# Patient Record
Sex: Male | Born: 1970
Health system: Southern US, Community
[De-identification: ages and names within clinical notes are randomized; demographics above are authoritative.]

## PROBLEM LIST (undated history)

## (undated) DIAGNOSIS — E119 Type 2 diabetes mellitus without complications: Secondary | ICD-10-CM

## (undated) DIAGNOSIS — G35 Multiple sclerosis: Secondary | ICD-10-CM

## (undated) DIAGNOSIS — E785 Hyperlipidemia, unspecified: Secondary | ICD-10-CM

## (undated) DIAGNOSIS — N200 Calculus of kidney: Secondary | ICD-10-CM

## (undated) DIAGNOSIS — I10 Essential (primary) hypertension: Secondary | ICD-10-CM

## (undated) DIAGNOSIS — J45909 Unspecified asthma, uncomplicated: Secondary | ICD-10-CM

## (undated) DIAGNOSIS — T7840XA Allergy, unspecified, initial encounter: Secondary | ICD-10-CM

## (undated) HISTORY — DX: Calculus of kidney: N20.0

## (undated) HISTORY — DX: Hyperlipidemia, unspecified: E78.5

## (undated) HISTORY — DX: Unspecified asthma, uncomplicated: J45.909

## (undated) HISTORY — PX: LITHOTRIPSY: SUR834

## (undated) HISTORY — DX: Multiple sclerosis: G35

## (undated) HISTORY — DX: Allergy, unspecified, initial encounter: T78.40XA

---

## 1997-01-19 DIAGNOSIS — G35 Multiple sclerosis: Secondary | ICD-10-CM

## 1997-01-19 HISTORY — DX: Multiple sclerosis: G35

## 2001-01-19 HISTORY — PX: APPENDECTOMY: SHX54

## 2003-05-17 ENCOUNTER — Encounter: Admission: RE | Admit: 2003-05-17 | Discharge: 2003-05-17 | Payer: Self-pay | Admitting: Family Medicine

## 2010-07-02 ENCOUNTER — Ambulatory Visit (INDEPENDENT_AMBULATORY_CARE_PROVIDER_SITE_OTHER): Payer: BC Managed Care – PPO | Admitting: Family Medicine

## 2010-07-02 ENCOUNTER — Encounter: Payer: Self-pay | Admitting: Family Medicine

## 2010-07-02 DIAGNOSIS — G35 Multiple sclerosis: Secondary | ICD-10-CM

## 2010-07-02 DIAGNOSIS — Z130 Encounter for screening for diseases of the blood and blood-forming organs and certain disorders involving the immune mechanism: Secondary | ICD-10-CM

## 2010-07-02 DIAGNOSIS — Z0000 Encounter for general adult medical examination without abnormal findings: Secondary | ICD-10-CM

## 2010-07-02 DIAGNOSIS — Z23 Encounter for immunization: Secondary | ICD-10-CM

## 2010-07-02 DIAGNOSIS — Z13220 Encounter for screening for lipoid disorders: Secondary | ICD-10-CM

## 2010-07-02 NOTE — Patient Instructions (Signed)
Tdap updated today.  Update fasting labs downstairs one morning. Will call you w/ results.  Update your eye exam.  Return in 1 yr, sooner if needed.

## 2010-07-02 NOTE — Progress Notes (Signed)
  Subjective:    Patient ID: [name], male    DOB: 06-23-1970, 40 y.o.   MRN: [id number]  HPI  40 yo WM presents for NOV to establish care.  He has not seen a doctor in years.  He was diagnosed with MS in 1999.  He sees Dr Estella Husk for neurology care.  It has been 2 yrs since his last visit there.  He stopped Rebif on his own 8 mos ago because has not had an MS flare since he 40.  He has relapsing remitting form with hx of L sided numbness.  Other than a hx of this and kidney stones, he has been healthy.  Due for fasting labs and a tetanus vaccine.  Denies fam hx of prostate or colon cancer.  He is a smoker.  Physically active.  No family hx of premature heart dz.    BP 119/80  Pulse 90  Ht 5\' 11"  (1.803 m)  Wt 186 lb (84.369 kg)  BMI 25.94 kg/m2  SpO2 99%   Review of Systems  Constitutional: Negative for fever, appetite change, fatigue and unexpected weight change.  HENT: Negative for sore throat and neck pain.   Eyes: Negative for pain and visual disturbance.  Respiratory: Negative for shortness of breath.   Cardiovascular: Negative for chest pain, palpitations and leg swelling.  Gastrointestinal: Negative for nausea, diarrhea and constipation.  Genitourinary: Negative for urgency, decreased urine volume, enuresis and difficulty urinating.  Skin: Negative for rash.  Neurological: Negative for numbness and headaches.  Psychiatric/Behavioral: Negative for sleep disturbance and dysphoric mood. The patient is not nervous/anxious.        Objective:   Physical Exam  Constitutional: He appears well-developed and well-nourished. No distress.  HENT:  Head: Normocephalic and atraumatic.  Right Ear: External ear normal.  Left Ear: External ear normal.  Nose: Nose normal.  Mouth/Throat: Oropharynx is clear and moist.  Eyes: Conjunctivae are normal. Pupils are equal, round, and reactive to light. No scleral icterus.  Neck: Normal range of motion. Neck supple. No thyromegaly  present.  Cardiovascular: Normal rate, regular rhythm, normal heart sounds and intact distal pulses.   No murmur heard. Pulmonary/Chest: Effort normal and breath sounds normal. No respiratory distress. He has no wheezes.  Abdominal: Soft. Bowel sounds are normal. He exhibits no distension. There is no tenderness. There is no guarding.       No HSM  Musculoskeletal: He exhibits no edema.  Lymphadenopathy:    He has no cervical adenopathy.  Neurological: He has normal reflexes.  Skin: Skin is warm and dry.  Psychiatric: He has a normal mood and affect.          Assessment & Plan:  CPE done.  Updated Tdap today.  Update fasting labs.  Continue healthy diet and work on regular exercise.  BP perfect.  BMI 25= OK.  He is in the contemplative phase of smoking cessation. Advised him to f/u with Dr Estella Husk re: risks stopping Rebif 8 mos ago. MVI daily.

## 2010-07-03 ENCOUNTER — Encounter: Payer: Self-pay | Admitting: Family Medicine

## 2010-07-03 LAB — LIPID PANEL
Cholesterol: 237 mg/dL — ABNORMAL HIGH (ref 0–200)
LDL Cholesterol: 170 mg/dL — ABNORMAL HIGH (ref 0–99)
Total CHOL/HDL Ratio: 7.2 Ratio
Triglycerides: 172 mg/dL — ABNORMAL HIGH (ref <150)
VLDL: 34 mg/dL (ref 0–40)

## 2010-07-04 ENCOUNTER — Telehealth: Payer: Self-pay | Admitting: Family Medicine

## 2010-07-04 LAB — COMPLETE METABOLIC PANEL WITH GFR
ALT: 15 U/L (ref 0–53)
Albumin: 5 g/dL (ref 3.5–5.2)
Alkaline Phosphatase: 71 U/L (ref 39–117)
GFR, Est Non African American: >60 mL/min (ref >60)
Glucose, Bld: 89 mg/dL (ref 70–99)
Potassium: 4.4 mEq/L (ref 3.5–5.3)
Sodium: 139 mEq/L (ref 135–145)
Total Bilirubin: 0.5 mg/dL (ref 0.3–1.2)
Total Protein: 7.9 g/dL (ref 6.0–8.3)

## 2010-07-04 LAB — CBC WITH DIFFERENTIAL/PLATELET
Eosinophils Relative: 4 % (ref 0–5)
HCT: 46.2 % (ref 39.0–52.0)
Hemoglobin: 15.4 g/dL (ref 13.0–17.0)
Lymphocytes Relative: 33 % (ref 12–46)
MCHC: 33.3 g/dL (ref 30.0–36.0)
MCV: 90.6 fL (ref 78.0–100.0)
Monocytes Absolute: 0.6 10*3/uL (ref 0.1–1.0)
Monocytes Relative: 8 % (ref 3–12)
Neutro Abs: 4.3 10*3/uL (ref 1.7–7.7)
RDW: 13.7 % (ref 11.5–15.5)
WBC: 7.7 10*3/uL (ref 4.0–10.5)

## 2010-07-04 MED ORDER — ATORVASTATIN CALCIUM 20 MG PO TABS: 20.0000 mg | ORAL_TABLET | Freq: Every day | ORAL | Status: DC

## 2010-07-04 NOTE — Telephone Encounter (Signed)
Pls let pt know that his blood counts came back normal.  Fasting sugar, liver and kidney function came back normal.  choesterol is HIGH with an LDL bad chol of 170.  This should be < 100.  Will proceed with treatment for high cholesterol to reduce risk for heart attack and stroke.  RX will be sent to pharmacy to start.  Take at bedtime each night.  Call if any problems.  Repeat labs in 8 wks.

## 2010-07-07 NOTE — Telephone Encounter (Signed)
LMOM for Pt to CB 

## 2010-07-10 NOTE — Telephone Encounter (Signed)
LMOM informing Pt of the above 

## 2010-08-07 ENCOUNTER — Telehealth: Payer: Self-pay | Admitting: Family Medicine

## 2010-08-07 MED ORDER — SIMVASTATIN 40 MG PO TABS: 40.0000 mg | ORAL_TABLET | Freq: Every day | ORAL | Status: DC

## 2010-08-07 NOTE — Telephone Encounter (Signed)
LMOM advising pt of change in med and BW in 56mo,

## 2010-08-07 NOTE — Telephone Encounter (Signed)
Pls let pt know that his Lipitor was denied.  I will change him to RX Simvastatin at bedtime daily.  Recheck LDL and Liver enzymes (lab only ) in 3 mos. Call if any problems.

## 2011-06-25 ENCOUNTER — Encounter: Payer: Self-pay | Admitting: Family Medicine

## 2011-06-25 ENCOUNTER — Ambulatory Visit (INDEPENDENT_AMBULATORY_CARE_PROVIDER_SITE_OTHER): Payer: BC Managed Care – PPO | Admitting: Family Medicine

## 2011-06-25 VITALS — BP 114/74 | HR 80 | Temp 98.5°F | Ht 71.0 in | Wt 189.0 lb

## 2011-06-25 DIAGNOSIS — R221 Localized swelling, mass and lump, neck: Secondary | ICD-10-CM

## 2011-06-25 DIAGNOSIS — R220 Localized swelling, mass and lump, head: Secondary | ICD-10-CM

## 2011-06-25 MED ORDER — AMOXICILLIN-POT CLAVULANATE 875-125 MG PO TABS: 1.0000 | ORAL_TABLET | Freq: Two times a day (BID) | ORAL | Status: AC

## 2011-06-25 NOTE — Progress Notes (Signed)
  Subjective:    Patient ID: [name], male    DOB: 06/23/1970, 41 y.o.   MRN: [id number]  HPI Swollen right LN for 1 month. More tender over the last 2 weeks. Very sensitive the last 4 days. Says started with allergy sxs.  Painful during sleep now.  Says feels like a baseball on the side of his neck. No fever.  Sinus sxs are much improved.  Was taking zyrtec for 2 months.  but is off that now.  No ST or dysphagia.    Review of Systems     Objective:   Physical Exam  Constitutional: [name] is oriented to person, place, and time. [name] appears well-developed and well-nourished.  HENT:  Head: Normocephalic and atraumatic.  Right Ear: External ear normal.  Left Ear: External ear normal.  Nose: Nose normal.  Mouth/Throat: Oropharynx is clear and moist.       TMs and canals are clear. Mild swelling in the right side of neck of the sternoclematoid. No actual palpable LN.  Mild swelling.   Eyes: Conjunctivae and EOM are normal. Pupils are equal, round, and reactive to light.  Neck: Neck supple. No thyromegaly present.  Cardiovascular: Normal rate and normal heart sounds.   Pulmonary/Chest: Effort normal and breath sounds normal.  Lymphadenopathy:    [name] has no cervical adenopathy.  Neurological: [name] is alert and oriented to person, place, and time.  Skin: Skin is warm and dry.  Psychiatric: [name] has a normal mood and affect.   Neck with NROM.        Assessment & Plan:  Neck swelling - Unclear etiology. No palpable lymph node.  Will check CBC w diff and will start ABX for treatment. If not sig better over the weekend then please let me and and consider imaging of th eneck.  [name] is mostly tender of the sternocletomastoid. Consider that this could be a strain of the muscle but infact it is veyr unusual that in a month has not gotten better and in fact has become more prominent and more tender.

## 2011-06-25 NOTE — Patient Instructions (Signed)
Call if not better into next week.   Can take Aleve or IBU to see if helps.

## 2011-06-26 LAB — CBC WITH DIFFERENTIAL/PLATELET
Basophils Absolute: 0 K/uL (ref 0.0–0.1)
Basophils Relative: 1 % (ref 0–1)
Eosinophils Absolute: 0.4 K/uL (ref 0.0–0.7)
Eosinophils Relative: 6 % — ABNORMAL HIGH (ref 0–5)
HCT: 45 % (ref 39.0–52.0)
Hemoglobin: 15.5 g/dL (ref 13.0–17.0)
Lymphocytes Relative: 34 % (ref 12–46)
Lymphs Abs: 2.5 K/uL (ref 0.7–4.0)
MCH: 29.9 pg (ref 26.0–34.0)
MCHC: 34.4 g/dL (ref 30.0–36.0)
MCV: 86.9 fL (ref 78.0–100.0)
Monocytes Absolute: 0.5 K/uL (ref 0.1–1.0)
Monocytes Relative: 7 % (ref 3–12)
Neutro Abs: 4 K/uL (ref 1.7–7.7)
Neutrophils Relative %: 52 % (ref 43–77)
Platelets: 235 K/uL (ref 150–400)
RBC: 5.18 MIL/uL (ref 4.22–5.81)
RDW: 13.5 % (ref 11.5–15.5)
WBC: 7.5 K/uL (ref 4.0–10.5)

## 2012-11-01 ENCOUNTER — Encounter: Payer: Self-pay | Admitting: Family Medicine

## 2012-11-01 ENCOUNTER — Ambulatory Visit (INDEPENDENT_AMBULATORY_CARE_PROVIDER_SITE_OTHER): Payer: BC Managed Care – PPO | Admitting: Family Medicine

## 2012-11-01 VITALS — BP 143/88 | HR 109 | Wt 182.0 lb

## 2012-11-01 DIAGNOSIS — R631 Polydipsia: Secondary | ICD-10-CM

## 2012-11-01 DIAGNOSIS — Z23 Encounter for immunization: Secondary | ICD-10-CM

## 2012-11-01 DIAGNOSIS — R358 Other polyuria: Secondary | ICD-10-CM

## 2012-11-01 DIAGNOSIS — R3589 Other polyuria: Secondary | ICD-10-CM

## 2012-11-01 LAB — POCT UA - MICROALBUMIN: Albumin/Creatinine Ratio, Urine, POC: >300

## 2012-11-01 NOTE — Progress Notes (Signed)
  Subjective:    Patient ID: [name], male    DOB: [birth year], 42 y.o.   MRN: [id number]  HPI Patient recently saw his urologist. He has a history of kidney stones. He complained of increased thirst and urination. He recommended referral to PCP to be evaluated for possible diabetes, or diabetes insipidus. Started about 3 mo ago.  Says sxs were very abrupt.  Still has inc thrist and urination during the day and night.  Says has increased his suger intake. Says sxs started around the time started eating a lot of soda.   No fam hx of DM.  No exposure to TB.  No hx or family hx os scarcoidosis. He quit smoking and started eating tons of winter green lifesavers.  No head trauam. No kidneys problems. No fam hx of lupus.  No hx of electrolyte problems.  Has lost a few lbs. No change in exercise or diet. No personal hx of autoimmune d/o.   Says drinking about 100 oz per day.    Review of Systems     Objective:   Physical Exam  Constitutional: He is oriented to person, place, and time. He appears well-developed and well-nourished.  HENT:  Head: Normocephalic and atraumatic.  Right Ear: External ear normal.  Left Ear: External ear normal.  Nose: Nose normal.  Mouth/Throat: Oropharynx is clear and moist.  Eyes: Conjunctivae and EOM are normal. Pupils are equal, round, and reactive to light.  Neck: Normal range of motion. Neck supple. No thyromegaly present.  Cardiovascular: Normal rate, regular rhythm, normal heart sounds and intact distal pulses.   Pulmonary/Chest: Effort normal and breath sounds normal.  Abdominal: Soft. Bowel sounds are normal. He exhibits no distension and no mass. There is no tenderness. There is no rebound and no guarding.  Musculoskeletal: Normal range of motion. He exhibits no edema.  Lymphadenopathy:    He has no cervical adenopathy.  Neurological: He is alert and oriented to person, place, and time. He has normal reflexes.  Skin: Skin is warm and dry.   Psychiatric: He has a normal mood and affect. His behavior is normal. Judgment and thought content normal.          Assessment & Plan:  Polyuria/polydipsia-unclear if diabetes mellitus or diabetes insipidus. We'll do a CMP, serum and plasma osmolality, urine sodium, and hemoglobin A1c today. Has no family history diabetes is fairly young. But his symptoms did start after he increased his amount of sugar in his diet. He has cut back on that in the last few days since being notified that he did have glucose in his urine. His symptoms were abrupt though which a little bit unusual for diabetes mellitus and more consistent with diabetes insipidus. He has no history of autoimmune disorders or family history of a recent immune disorders. He is not on any medications that should be causing his symptoms.

## 2012-11-02 LAB — COMPLETE METABOLIC PANEL WITH GFR
ALT: 14 U/L (ref 0–53)
Albumin: 4.5 g/dL (ref 3.5–5.2)
CO2: 16 mEq/L — ABNORMAL LOW (ref 19–32)
GFR, Est Non African American: >89 mL/min
Glucose, Bld: 293 mg/dL — ABNORMAL HIGH (ref 70–99)
Potassium: 4.3 mEq/L (ref 3.5–5.3)
Sodium: 131 mEq/L — ABNORMAL LOW (ref 135–145)
Total Protein: 7.3 g/dL (ref 6.0–8.3)

## 2012-11-02 LAB — OSMOLALITY: Osmolality: 296 mOsm/kg (ref 275–300)

## 2012-11-02 LAB — HEMOGLOBIN A1C: Hgb A1c MFr Bld: >20 % — ABNORMAL HIGH (ref <5.7)

## 2012-11-02 LAB — OSMOLALITY, URINE: Osmolality, Ur: 880 mOsm/kg (ref 390–1090)

## 2012-11-03 ENCOUNTER — Other Ambulatory Visit: Payer: Self-pay | Admitting: Family Medicine

## 2012-11-03 DIAGNOSIS — R7309 Other abnormal glucose: Secondary | ICD-10-CM

## 2012-11-03 MED ORDER — AMBULATORY NON FORMULARY MEDICATION: Status: DC

## 2012-11-04 ENCOUNTER — Encounter: Payer: Self-pay | Admitting: *Deleted

## 2012-11-04 ENCOUNTER — Ambulatory Visit (INDEPENDENT_AMBULATORY_CARE_PROVIDER_SITE_OTHER): Payer: BC Managed Care – PPO | Admitting: Family Medicine

## 2012-11-04 ENCOUNTER — Other Ambulatory Visit: Payer: Self-pay | Admitting: *Deleted

## 2012-11-04 DIAGNOSIS — E139 Other specified diabetes mellitus without complications: Secondary | ICD-10-CM

## 2012-11-04 DIAGNOSIS — E119 Type 2 diabetes mellitus without complications: Secondary | ICD-10-CM

## 2012-11-04 MED ORDER — INSULIN GLARGINE 100 UNIT/ML SOLOSTAR PEN: 10.0000 [IU] | PEN_INJECTOR | Freq: Every day | SUBCUTANEOUS | Status: DC

## 2012-11-04 MED ORDER — AMBULATORY NON FORMULARY MEDICATION: Status: DC

## 2012-11-04 MED ORDER — METFORMIN HCL 500 MG PO TABS: 500.0000 mg | ORAL_TABLET | Freq: Two times a day (BID) | ORAL | Status: DC

## 2012-11-04 NOTE — Progress Notes (Signed)
  Subjective:    Patient ID: [name], male    DOB: Oct 26, 1970, 42 y.o.   MRN: [id number] Pt here today for diabetes teaching and education. Pt introduced on do's and don'ts and shown how to check BS and give self insulin injections. Pt verbalized understanding. Scheduled for f/u in 1 week with Dr. Linford Arnold and will be scheduled with endocrinologist. Pt informed to call with any concerns or questions.  Austin Cory, LPN  HPI    Review of Systems     Objective:   Physical Exam        Assessment & Plan:

## 2012-11-11 ENCOUNTER — Ambulatory Visit (INDEPENDENT_AMBULATORY_CARE_PROVIDER_SITE_OTHER): Payer: BC Managed Care – PPO | Admitting: Family Medicine

## 2012-11-11 ENCOUNTER — Encounter: Payer: Self-pay | Admitting: Family Medicine

## 2012-11-11 VITALS — BP 133/83 | HR 99 | Wt 185.0 lb

## 2012-11-11 DIAGNOSIS — E139 Other specified diabetes mellitus without complications: Secondary | ICD-10-CM

## 2012-11-11 DIAGNOSIS — E119 Type 2 diabetes mellitus without complications: Secondary | ICD-10-CM

## 2012-11-11 MED ORDER — METFORMIN HCL 1000 MG PO TABS: 1000.0000 mg | ORAL_TABLET | Freq: Two times a day (BID) | ORAL | Status: DC

## 2012-11-11 NOTE — Progress Notes (Signed)
  Subjective:    Patient ID: [name], male    DOB: 29-Jul-1970, 42 y.o.   MRN: [id number]  HPI DM - has lots of question. Has a hx of MS.  He has dietary questions. He is having a lot of problems with his vision. Up to 15 units on his Lantus. No hypoglycemic events. Tolerating metformin well. Taking BID. Nauseated at first but that is resolved. No diarrhea or loose stools. Initially he denied any family history of diabetes but says he remembered that his mother sister actually had diabetes and died from it. He has been using his metered twice a day. His home fasting blood sugars in the morning have been running around mid 200s. His evening blood sugars have been running in the mid-200s as well. We were able to get him an endocrinology appointment but it's not until early December. Having a lot of vision problems at work. He has to rely number stop the computer screen and he's had significant changes in his vision that makes it very for straining to work. He admits to feeling alittle angry and frustrated about his new diagnosis. He wants to know if a Atkins diet it is okay or not.   Review of Systems     Objective:   Physical Exam  Constitutional: He is oriented to person, place, and time. He appears well-developed and well-nourished.  HENT:  Head: Normocephalic and atraumatic.  Neurological: He is alert and oriented to person, place, and time.  Skin: Skin is warm and dry.  Psychiatric: He has a normal mood and affect. His behavior is normal.          Assessment & Plan:  DM - Increase Lantus 20 units. Goal for fasting under 130.   Will refer for nutrition therapy.  Increase metformin to 1000mg .  I am not a big fan of Atkins diet. After further he just watch his portion sizes on his carbohydrates and eat lean meats. Continue to watch fat intake and salt intake. We'll place a referral for nutrition counseling. Continue check blood sugars at home twice a day. As stated he has a dated  he's not with feeling well I encouraged him to check her sugar. We reviewed warning signs for hyperglycemia as well as hypoglycemia. His wife is hear for the entire conversation today. Will check C-peptide and GAD 65 antibodies. Gave him reassurance about the vision changes. Explained how the cornea swells when the blood sugars are elevated and it is not worth getting an eye exam at this point time until we're able to get his sugars down to reasonable level. For right now he is wearing some over-the-counter readers and this is perfectly fine. Followup in 3-4 weeks to make sure that he is doing well and to answer any additional questions. Keep scheduled appointment in December for consult with endocrinology.  Time spent 30 minutes, greater than 50% of time spent counseling about his new diagnosis of diabetes.

## 2012-12-01 ENCOUNTER — Ambulatory Visit: Payer: BC Managed Care – PPO | Admitting: Family Medicine

## 2012-12-01 DIAGNOSIS — Z0289 Encounter for other administrative examinations: Secondary | ICD-10-CM

## 2012-12-29 ENCOUNTER — Other Ambulatory Visit: Payer: Self-pay | Admitting: *Deleted

## 2012-12-29 MED ORDER — METFORMIN HCL 1000 MG PO TABS: 1000.0000 mg | ORAL_TABLET | Freq: Two times a day (BID) | ORAL | Status: DC

## 2013-01-02 ENCOUNTER — Ambulatory Visit (INDEPENDENT_AMBULATORY_CARE_PROVIDER_SITE_OTHER): Payer: BC Managed Care – PPO | Admitting: Family Medicine

## 2013-01-02 ENCOUNTER — Telehealth: Payer: Self-pay | Admitting: Family Medicine

## 2013-01-02 ENCOUNTER — Encounter: Payer: Self-pay | Admitting: Family Medicine

## 2013-01-02 VITALS — BP 112/68 | HR 86 | Temp 98.5°F | Wt 198.0 lb

## 2013-01-02 DIAGNOSIS — E785 Hyperlipidemia, unspecified: Secondary | ICD-10-CM

## 2013-01-02 DIAGNOSIS — E119 Type 2 diabetes mellitus without complications: Secondary | ICD-10-CM

## 2013-01-02 DIAGNOSIS — E139 Other specified diabetes mellitus without complications: Secondary | ICD-10-CM

## 2013-01-02 LAB — POCT GLYCOSYLATED HEMOGLOBIN (HGB A1C): Hemoglobin A1C: 8.9

## 2013-01-02 LAB — HEMOGLOBIN A1C: Hgb A1c MFr Bld: 8.9 % — AB (ref 4.0–6.0)

## 2013-01-02 NOTE — Telephone Encounter (Signed)
Please call pt: Let hime know I think hsi A1C is down enough to check cholesterol now. i will print labs and he can go anytime.

## 2013-01-02 NOTE — Progress Notes (Signed)
   Subjective:    Patient ID: [name], male    DOB: 12/17/1970, 42 y.o.   MRN: [id number]  HPI DM - no hypoglycemic events. No wounds or sores that are not healing well. Taking medications as prescribed without side effects or problems. We increased his Lantus to 20 units last time I saw him. He was refer him for diabetic education. He does see an eye exam but we wanted to get his blood sugars down before he made referral.  Sugars running 128-183.  Using 40 units on the lantus. He did see nutrition therapy and that was helpful.  Has first endocrine appt later this week.    Lab Results  Component Value Date   HGBA1C >20.0* 11/01/2012   No inc thrist or urination. Has resolved.   Review of Systems     Objective:   Physical Exam  Constitutional: He is oriented to person, place, and time. He appears well-developed and well-nourished.  HENT:  Head: Normocephalic and atraumatic.  Cardiovascular: Normal rate, regular rhythm and normal heart sounds.   Pulmonary/Chest: Effort normal and breath sounds normal.  Neurological: He is alert and oriented to person, place, and time.  Skin: Skin is warm and dry.  Psychiatric: He has a normal mood and affect. His behavior is normal.          Assessment & Plan:  Diabetes-Not well controlled but greatly improved. Did A1C a little early just to make sure on track. I am happy with his recent progress and numbers. Continue current regimen. Followup in 3-4 months. Keep endocrinology appointment for later this week. Hopefully can schedule an eye appointment after the new year. His blood sugars will be down to normal level where he can go and actually get evaluated for retinopathy as well as new lenses.completed nutrition therapy.  Due to check lipids as well.   Lab Results  Component Value Date   HGBA1C 8.9 01/02/2013

## 2013-01-03 NOTE — Telephone Encounter (Signed)
LMOM for pt to return call.  Misty Ahmad, LPN  

## 2013-01-03 NOTE — Telephone Encounter (Signed)
Pt informed.[name]  

## 2013-01-05 ENCOUNTER — Other Ambulatory Visit: Payer: Self-pay | Admitting: Family Medicine

## 2013-01-06 LAB — LDL CHOLESTEROL, DIRECT: Direct LDL: 157 mg/dL — ABNORMAL HIGH

## 2013-01-13 ENCOUNTER — Other Ambulatory Visit: Payer: Self-pay | Admitting: Family Medicine

## 2013-01-13 MED ORDER — SIMVASTATIN 40 MG PO TABS: 40.0000 mg | ORAL_TABLET | Freq: Every evening | ORAL | Status: DC | PRN

## 2013-03-14 LAB — HEMOGLOBIN A1C: Hgb A1c MFr Bld: 6.6 % — AB (ref 4.0–6.0)

## 2013-04-03 ENCOUNTER — Encounter: Payer: Self-pay | Admitting: Family Medicine

## 2013-04-03 ENCOUNTER — Ambulatory Visit (INDEPENDENT_AMBULATORY_CARE_PROVIDER_SITE_OTHER): Payer: BC Managed Care – PPO | Admitting: Family Medicine

## 2013-04-03 VITALS — BP 122/67 | HR 73 | Wt 201.0 lb

## 2013-04-03 DIAGNOSIS — E119 Type 2 diabetes mellitus without complications: Secondary | ICD-10-CM

## 2013-04-03 DIAGNOSIS — G35 Multiple sclerosis: Secondary | ICD-10-CM | POA: Insufficient documentation

## 2013-04-03 DIAGNOSIS — N200 Calculus of kidney: Secondary | ICD-10-CM | POA: Insufficient documentation

## 2013-04-03 DIAGNOSIS — E139 Other specified diabetes mellitus without complications: Secondary | ICD-10-CM

## 2013-04-03 DIAGNOSIS — Z23 Encounter for immunization: Secondary | ICD-10-CM

## 2013-04-03 LAB — POCT GLYCOSYLATED HEMOGLOBIN (HGB A1C): Hemoglobin A1C: 6.6

## 2013-04-03 NOTE — Progress Notes (Signed)
   Subjective:    Patient ID: [name], male    DOB: 1970/06/21, 43 y.o.   MRN: [id number]  HPI Diabetes - no hypoglycemic events. No wounds or sores that are not healing well. No increased thirst or urination. Checking glucose at home. Taking medications as prescribed without any side effects. Did attend the nutrition classes.  Lab Results  Component Value Date   HGBA1C 8.9 01/02/2013    Review of Systems     Objective:   Physical Exam  Constitutional: He is oriented to person, place, and time. He appears well-developed and well-nourished.  HENT:  Head: Normocephalic and atraumatic.  Cardiovascular: Normal rate, regular rhythm and normal heart sounds.   Pulmonary/Chest: Effort normal and breath sounds normal.  Neurological: He is alert and oriented to person, place, and time.  Skin: Skin is warm and dry.  Psychiatric: He has a normal mood and affect. His behavior is normal.          Assessment & Plan:  DM- Much improed. Taking baby aspirin.  Did complete nutriotion eduction.  Did get in for eye exam last week.   On statin.  Continue to follow with endocrinology. At this point I will not addressed his diabetes unless he has any questions or cannot get in with endocrinology. I will be happy to behavior take care of him he and all other issues better.

## 2013-04-03 NOTE — Addendum Note (Signed)
Addended by: Teddy Spike on: 04/03/2013 01:59 PM   Modules accepted: Orders

## 2013-04-05 ENCOUNTER — Encounter: Payer: Self-pay | Admitting: *Deleted

## 2013-04-13 ENCOUNTER — Other Ambulatory Visit: Payer: Self-pay | Admitting: Family Medicine

## 2013-05-18 ENCOUNTER — Other Ambulatory Visit: Payer: Self-pay | Admitting: Family Medicine

## 2013-06-16 ENCOUNTER — Encounter: Payer: Self-pay | Admitting: *Deleted

## 2013-06-16 LAB — POCT GLYCOSYLATED HEMOGLOBIN (HGB A1C): Hemoglobin A1C: 6.8

## 2013-06-22 DIAGNOSIS — N529 Male erectile dysfunction, unspecified: Secondary | ICD-10-CM | POA: Insufficient documentation

## 2013-06-22 DIAGNOSIS — R350 Frequency of micturition: Secondary | ICD-10-CM | POA: Insufficient documentation

## 2013-06-22 DIAGNOSIS — N400 Enlarged prostate without lower urinary tract symptoms: Secondary | ICD-10-CM | POA: Insufficient documentation

## 2013-06-28 DIAGNOSIS — N138 Other obstructive and reflux uropathy: Secondary | ICD-10-CM | POA: Insufficient documentation

## 2013-06-28 DIAGNOSIS — N401 Enlarged prostate with lower urinary tract symptoms: Secondary | ICD-10-CM | POA: Insufficient documentation

## 2013-11-21 ENCOUNTER — Other Ambulatory Visit: Payer: Self-pay | Admitting: Family Medicine

## 2013-12-26 ENCOUNTER — Other Ambulatory Visit: Payer: Self-pay | Admitting: Family Medicine

## 2014-01-22 ENCOUNTER — Other Ambulatory Visit: Payer: Self-pay | Admitting: Family Medicine

## 2014-02-12 ENCOUNTER — Encounter: Payer: Self-pay | Admitting: Family Medicine

## 2014-02-12 ENCOUNTER — Ambulatory Visit (INDEPENDENT_AMBULATORY_CARE_PROVIDER_SITE_OTHER): Payer: BLUE CROSS/BLUE SHIELD | Admitting: Family Medicine

## 2014-02-12 VITALS — BP 119/65 | HR 82 | Ht 71.0 in | Wt 201.0 lb

## 2014-02-12 DIAGNOSIS — Z0000 Encounter for general adult medical examination without abnormal findings: Secondary | ICD-10-CM

## 2014-02-12 DIAGNOSIS — E781 Pure hyperglyceridemia: Secondary | ICD-10-CM

## 2014-02-12 DIAGNOSIS — Z0189 Encounter for other specified special examinations: Secondary | ICD-10-CM

## 2014-02-12 DIAGNOSIS — E139 Other specified diabetes mellitus without complications: Secondary | ICD-10-CM

## 2014-02-12 LAB — POCT GLYCOSYLATED HEMOGLOBIN (HGB A1C): Hemoglobin A1C: 7.2

## 2014-02-12 MED ORDER — METFORMIN HCL 1000 MG PO TABS: ORAL_TABLET | ORAL | Status: DC

## 2014-02-12 NOTE — Progress Notes (Signed)
Subjective:    Patient ID: [name], male    DOB: 12-03-1970, 44 y.o.   MRN: [id number]  HPI Here for CPE today. Working with Endocrinology.  A1 is under 8.0.  On Lantus.  Last eye exam Dr. Marinus Maw.  He is in Furniture conservator/restorer on Animal nutritionist. Doing well overall.  Some exercise but not rcently with change in weather.    Review of Systems Comprehensive ROS is neg per patient. Does get some stool urgency with the metformin.   BP 119/65 mmHg  Pulse 82  Ht 5\' 11"  (1.803 m)  Wt 201 lb (91.173 kg)  BMI 28.05 kg/m2  SpO2 98%    No Known Allergies  Past Medical History  Diagnosis Date  . Multiple sclerosis 1999    Dr Trula Ore  . Nephrolithiasis     sees urology    Past Surgical History  Procedure Laterality Date  . Appendectomy  2003    History   Social History  . Marital Status: Married    Spouse Name: christy    Number of Children: 2  . Years of Education: N/A   Occupational History  . 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 Smoking status: Former Smoker -- 1.00 packs/day    Types: Cigarettes    Quit date: 10/28/2011  . Smokeless tobacco: Not on file  . Alcohol Use: No  . Drug Use: No  . Sexual Activity: Yes   Other Topics Concern  . Not on file   Social History Narrative    Family History  Problem Relation Age of Onset  . Hypertension Mother   . Alcohol abuse Father   . Cancer Maternal Grandfather   . Diabetes Maternal Aunt     Outpatient Encounter Prescriptions as of 02/12/2014  Medication Sig  . AMBULATORY NON FORMULARY MEDICATION Medication Name: Glucometer, strips and lancet to test s x a day. Dx 250.02.  Marland Kitchen AMBULATORY NON FORMULARY MEDICATION Medication Name: Insulin Pen Needle for Lantus Solostar Pen   Dx: Diabetes 250.02  . aspirin 81 MG chewable tablet Chew 81 mg by mouth.  Marland Kitchen LANTUS SOLOSTAR 100 UNIT/ML Solostar Pen INJECT 10 TO 20 UNITS SUBCUTANEOUSLY INTO THE SKIN AT BEDTIME (Patient taking differently: INJECT 10 TO  40 UNITS SUBCUTANEOUSLY INTO THE SKIN AT BEDTIME)  . lisinopril (PRINIVIL,ZESTRIL) 5 MG tablet   . metFORMIN (GLUCOPHAGE) 1000 MG tablet Take 1 tablet by mouth twice daily with a meal  . ONE TOUCH ULTRA TEST test strip USE AS DIRECTED TO TEST BLOOD SUGAR TWICE A DAY  . simvastatin (ZOCOR) 40 MG tablet Take 1 tablet (40 mg total) by mouth at bedtime and may repeat dose one time if needed.  . tamsulosin (FLOMAX) 0.4 MG CAPS capsule Take 0.4 mg by mouth daily.  . [DISCONTINUED] metFORMIN (GLUCOPHAGE) 1000 MG tablet Take 1 tablet by mouth twice daily with a meal **OFFICE APPOINTMENT NEEDED FOR REFILLS**          Objective:   Physical Exam  Constitutional: He is oriented to person, place, and time. He appears well-developed and well-nourished.  HENT:  Head: Normocephalic and atraumatic.  Right Ear: External ear normal.  Left Ear: External ear normal.  Nose: Nose normal.  Mouth/Throat: Oropharynx is clear and moist.  Eyes: Conjunctivae and EOM are normal. Pupils are equal, round, and reactive to light.  Neck: Normal range of motion. Neck supple. No thyromegaly present.  Cardiovascular: Normal rate, regular rhythm, normal heart sounds and  intact distal pulses.   Pulmonary/Chest: Effort normal and breath sounds normal.  Abdominal: Soft. Bowel sounds are normal. He exhibits no distension and no mass. There is no tenderness. There is no rebound and no guarding.  Musculoskeletal: Normal range of motion.  Lymphadenopathy:    He has no cervical adenopathy.  Neurological: He is alert and oriented to person, place, and time. He has normal reflexes.  Skin: Skin is warm and dry.  Psychiatric: He has a normal mood and affect. His behavior is normal. Judgment and thought content normal.          Assessment & Plan:  CPE- doing well overall. Did encourage more regular exercise. He's not on a regular routine lately.  Due for CMP and lipid.  Also check direct LDL. He has a history of  hypertriglyceridemia.  Latent autoimmune diabetes in adult-follows with endocrinology regularly. A1c is down to 7.2 today which is fantastic. Foot exam performed today. Eye exam is up-to-date. We'll call to check it copy of last eye exam.

## 2014-02-12 NOTE — Patient Instructions (Signed)
Keep up a regular exercise program and make sure you are eating a healthy diet Try to eat 4 servings of dairy a day, or if you are lactose intolerant take a calcium with vitamin D daily.  Your vaccines are up to date.   

## 2014-02-14 ENCOUNTER — Other Ambulatory Visit: Payer: Self-pay | Admitting: Family Medicine

## 2014-02-16 LAB — COMPLETE METABOLIC PANEL WITH GFR
ALK PHOS: 65 U/L (ref 39–117)
ALT: 22 U/L (ref 0–53)
AST: 14 U/L (ref 0–37)
Albumin: 4.6 g/dL (ref 3.5–5.2)
BILIRUBIN TOTAL: 0.3 mg/dL (ref 0.2–1.2)
BUN: 20 mg/dL (ref 6–23)
CO2: 23 mEq/L (ref 19–32)
Calcium: 9.8 mg/dL (ref 8.4–10.5)
Chloride: 104 mEq/L (ref 96–112)
Creat: 0.83 mg/dL (ref 0.50–1.35)
GFR, Est Non African American: >89 mL/min
GLUCOSE: 162 mg/dL — AB (ref 70–99)
POTASSIUM: 4.6 meq/L (ref 3.5–5.3)
SODIUM: 139 meq/L (ref 135–145)
TOTAL PROTEIN: 7.3 g/dL (ref 6.0–8.3)

## 2014-02-16 LAB — LDL CHOLESTEROL, DIRECT: Direct LDL: 111 mg/dL — ABNORMAL HIGH

## 2014-02-16 LAB — HIV ANTIBODY (ROUTINE TESTING W REFLEX): HIV 1&2 Ab, 4th Generation: NONREACTIVE

## 2014-02-16 LAB — LIPID PANEL
CHOL/HDL RATIO: 6.3 ratio
Cholesterol: 169 mg/dL (ref 0–200)
HDL: 27 mg/dL — AB (ref >39)
LDL Cholesterol: 91 mg/dL (ref 0–99)
TRIGLYCERIDES: 254 mg/dL — AB (ref <150)
VLDL: 51 mg/dL — ABNORMAL HIGH (ref 0–40)

## 2014-03-19 ENCOUNTER — Other Ambulatory Visit: Payer: Self-pay | Admitting: Family Medicine

## 2014-10-22 ENCOUNTER — Other Ambulatory Visit: Payer: Self-pay | Admitting: Family Medicine

## 2014-12-06 ENCOUNTER — Other Ambulatory Visit: Payer: Self-pay | Admitting: Family Medicine

## 2014-12-06 MED ORDER — BAYER CONTOUR NEXT MONITOR W/DEVICE KIT: PACK | Status: DC

## 2014-12-06 MED ORDER — GLUCOSE BLOOD VI STRP: ORAL_STRIP | Status: DC

## 2014-12-06 NOTE — Telephone Encounter (Signed)
Due to insurance formulary change, preferred Glucometer is now Contour Next meters.

## 2015-02-05 MED FILL — CIALIS 5 MG TABLET: 5 | 15 days supply | Qty: 15 | Fill #7

## 2015-02-05 MED FILL — SIMVASTATIN 40 MG TABLET: 40 | 30 days supply | Qty: 30 | Fill #9

## 2015-02-05 MED FILL — TAMSULOSIN HCL 0.4 MG CAP: 0.4 | 30 days supply | Qty: 30 | Fill #4

## 2015-02-14 ENCOUNTER — Encounter: Payer: BLUE CROSS/BLUE SHIELD | Admitting: Family Medicine

## 2015-02-20 MED FILL — LISINOPRIL 5 MG TABLET: 5 | 90 days supply | Qty: 90 | Fill #2

## 2015-02-20 MED FILL — ONE TOUCH ULTRA TEST STRIPS: 25 days supply | Qty: 50 | Fill #2

## 2015-02-20 MED FILL — INVOKAMET 50-1,000 MG TAB: 50-1000 | 30 days supply | Qty: 60 | Fill #0

## 2015-03-08 MED FILL — SIMVASTATIN 40 MG TABLET: 40 | 30 days supply | Qty: 30 | Fill #10

## 2015-03-08 MED FILL — TAMSULOSIN HCL 0.4 MG CAP: 0.4 | 30 days supply | Qty: 30 | Fill #0

## 2015-03-20 MED FILL — INVOKAMET 50-1,000 MG TAB: 50-1000 | 30 days supply | Qty: 60 | Fill #1

## 2015-04-08 ENCOUNTER — Other Ambulatory Visit: Payer: Self-pay | Admitting: Family Medicine

## 2015-04-08 MED FILL — TAMSULOSIN HCL 0.4 MG CAP: 0.4 | 30 days supply | Qty: 30 | Fill #0

## 2015-04-08 MED FILL — SIMVASTATIN 40 MG TABLET: 40 | 30 days supply | Qty: 30 | Fill #0

## 2015-04-18 ENCOUNTER — Encounter: Payer: Self-pay | Admitting: Family Medicine

## 2015-04-18 ENCOUNTER — Ambulatory Visit (INDEPENDENT_AMBULATORY_CARE_PROVIDER_SITE_OTHER): Payer: BLUE CROSS/BLUE SHIELD | Admitting: Family Medicine

## 2015-04-18 VITALS — BP 133/92 | HR 88 | Ht 71.0 in | Wt 178.0 lb

## 2015-04-18 DIAGNOSIS — R21 Rash and other nonspecific skin eruption: Secondary | ICD-10-CM | POA: Diagnosis not present

## 2015-04-18 DIAGNOSIS — B351 Tinea unguium: Secondary | ICD-10-CM | POA: Diagnosis not present

## 2015-04-18 DIAGNOSIS — Z0000 Encounter for general adult medical examination without abnormal findings: Secondary | ICD-10-CM | POA: Diagnosis not present

## 2015-04-18 DIAGNOSIS — Z23 Encounter for immunization: Secondary | ICD-10-CM

## 2015-04-18 NOTE — Progress Notes (Signed)
   Subjective:    Patient ID: [name], male    DOB: 1971-01-07, 45 y.o.   MRN: [id number]  HPI Here for CPE  Follows with Dr. Steffanie Dunn for his Diabetes. A1C of 6.4. Eye exam adn foot exam is UTD.   She does have an abnormal toenail on the left foot second toenail that he would like me to look at today. He says he thinks that the fungus. He did try an over-the-counter fungal cream for about 3 or 4 weeks but didn't notice any improvement. He is 1 to make sure that it wasn't a problem more dangerous or concerning reflective of any other underlying disorders. Next  He's also had a rash on the end of his penis that's been there for at least 5 months since October. He denies any itching or irritation. He said the skin just stays dry and will peel. Sometimes it feels a little irritated in the shower when water gets on it. He denies any papules or pustules or vesicles. No open wounds or drainage. He has no prior history of eczema or other skin conditions. He's been applying a moisturizer over-the-counter, Lubriderm and it really hasn't seemed to help.  Review of Systems     Objective:   Physical Exam  Constitutional: He is oriented to person, place, and time. He appears well-developed and well-nourished.  HENT:  Head: Normocephalic and atraumatic.  Right Ear: External ear normal.  Left Ear: External ear normal.  Nose: Nose normal.  Mouth/Throat: Oropharynx is clear and moist.  Eyes: Conjunctivae and EOM are normal. Pupils are equal, round, and reactive to light.  Neck: Normal range of motion. Neck supple. No thyromegaly present.  Cardiovascular: Normal rate, regular rhythm, normal heart sounds and intact distal pulses.   Pulmonary/Chest: Effort normal and breath sounds normal.  Abdominal: Soft. Bowel sounds are normal. He exhibits no distension and no mass. There is no tenderness. There is no rebound and no guarding.  Genitourinary:     Musculoskeletal: Normal range of motion.    Lymphadenopathy:    He has no cervical adenopathy.  Neurological: He is alert and oriented to person, place, and time. He has normal reflexes.  Skin: Skin is warm and dry.  Psychiatric: He has a normal mood and affect. His behavior is normal. Judgment and thought content normal.   Second toenail on left foot is thick and yellow throughout the entire nail.       Assessment & Plan:  CPE Keep up a regular exercise program and make sure you are eating a healthy diet Try to eat 4 servings of dairy a day, or if you are lactose intolerant take a calcium with vitamin D daily.  Your vaccines are up to date.    Diabetes - well controlled.  Follows with Dr. Steffanie Dunn.  Dystrophic nail versus onychomycosis-we discussed typical treatment such as oral Lamisil versus topicals including Jublia and Penlac. At this point in time he wants to hold off on any prescription treatment but only call back and let me know if he changes his mind.  Rash, penis - eczema vs tinea. Skin scraping performed today. Will call with results once available.

## 2015-04-22 LAB — FUNGAL STAIN

## 2015-04-22 MED FILL — INVOKAMET 50-1,000 MG TAB: 50-1000 | 30 days supply | Qty: 60 | Fill #2

## 2015-04-23 MED ORDER — TRIAMCINOLONE ACETONIDE 0.1 % EX CREA: 1.0000 "application " | TOPICAL_CREAM | Freq: Every day | CUTANEOUS | Status: DC

## 2015-04-23 NOTE — Addendum Note (Signed)
Addended by: Beatrice Lecher D on: 04/23/2015 10:03 PM   Modules accepted: Orders

## 2015-04-24 MED FILL — TRIAMCINOLONE 0.1% CREAM: 0.1 | 15 days supply | Qty: 15 | Fill #0

## 2015-05-02 ENCOUNTER — Other Ambulatory Visit: Payer: Self-pay | Admitting: *Deleted

## 2015-05-02 MED ORDER — BLOOD GLUCOSE MONITOR KIT: PACK | Status: DC

## 2015-05-06 ENCOUNTER — Other Ambulatory Visit: Payer: Self-pay | Admitting: Family Medicine

## 2015-05-06 MED FILL — LISINOPRIL 5 MG TABLET: 5 | 90 days supply | Qty: 90 | Fill #3

## 2015-05-06 MED FILL — SIMVASTATIN 40 MG TABLET: 40 | 30 days supply | Qty: 30 | Fill #0

## 2015-05-06 MED FILL — MICROLET LANCETS: 33 days supply | Qty: 100 | Fill #0

## 2015-05-06 MED FILL — CONTOUR NEXT METER: W/DEVICE | 1 days supply | Qty: 1 | Fill #0

## 2015-05-06 MED FILL — CONTOUR NEXT STRIPS: 33 days supply | Qty: 100 | Fill #0

## 2015-05-17 MED FILL — METFORMIN HCL ER 500 MG TAB: 500 | 30 days supply | Qty: 120 | Fill #0

## 2015-06-11 ENCOUNTER — Other Ambulatory Visit: Payer: Self-pay | Admitting: Family Medicine

## 2015-06-25 MED FILL — SIMVASTATIN 40 MG TABLET: 40 | 30 days supply | Qty: 30 | Fill #0

## 2015-06-25 MED FILL — INVOKAMET 50-1,000 MG TAB: 50-1000 | 30 days supply | Qty: 60 | Fill #3

## 2015-06-25 MED FILL — CONTOUR NEXT STRIPS: 50 days supply | Qty: 100 | Fill #0

## 2015-07-22 ENCOUNTER — Other Ambulatory Visit: Payer: Self-pay | Admitting: Family Medicine

## 2015-07-22 MED FILL — INVOKAMET 50-1,000 MG TAB: 50-1000 | 30 days supply | Qty: 60 | Fill #4

## 2015-07-22 MED FILL — SIMVASTATIN 40 MG TABLET: 40 | 30 days supply | Qty: 30 | Fill #0

## 2015-08-07 ENCOUNTER — Encounter: Payer: Self-pay | Admitting: *Deleted

## 2015-08-07 LAB — HEMOGLOBIN A1C: A1c: 6.4

## 2015-08-07 LAB — MICROALBUMIN / CREATININE URINE RATIO: MICROALB/CREAT RATIO: 76.4

## 2015-08-19 ENCOUNTER — Other Ambulatory Visit: Payer: Self-pay | Admitting: Family Medicine

## 2015-08-19 MED FILL — INVOKAMET 50-1,000 MG TAB: 50-1000 | 30 days supply | Qty: 60 | Fill #5

## 2015-08-19 MED FILL — LISINOPRIL 5 MG TABLET: 5 | 90 days supply | Qty: 90 | Fill #0

## 2015-08-20 ENCOUNTER — Other Ambulatory Visit: Payer: Self-pay | Admitting: *Deleted

## 2015-08-20 MED ORDER — SIMVASTATIN 40 MG PO TABS: ORAL_TABLET | ORAL | 0 refills | Status: DC

## 2015-08-20 MED FILL — SIMVASTATIN 40 MG TABLET: 40 | 30 days supply | Qty: 30 | Fill #0

## 2015-09-20 ENCOUNTER — Other Ambulatory Visit: Payer: Self-pay | Admitting: *Deleted

## 2015-09-20 ENCOUNTER — Other Ambulatory Visit: Payer: Self-pay | Admitting: Physician Assistant

## 2015-09-20 MED ORDER — GLUCOSE BLOOD VI STRP: ORAL_STRIP | 12 refills | Status: DC

## 2015-09-20 MED FILL — INVOKAMET 50-1,000 MG TAB: 50-1000 | 30 days supply | Qty: 60 | Fill #0

## 2015-09-20 MED FILL — SIMVASTATIN 40 MG TABLET: 40 | 30 days supply | Qty: 30 | Fill #0

## 2015-09-20 MED FILL — CONTOUR NEXT STRIPS: 50 days supply | Qty: 100 | Fill #0

## 2015-10-09 DIAGNOSIS — Z23 Encounter for immunization: Secondary | ICD-10-CM | POA: Diagnosis not present

## 2015-10-09 DIAGNOSIS — R809 Proteinuria, unspecified: Secondary | ICD-10-CM | POA: Diagnosis not present

## 2015-10-09 DIAGNOSIS — E1129 Type 2 diabetes mellitus with other diabetic kidney complication: Secondary | ICD-10-CM | POA: Diagnosis not present

## 2015-10-09 DIAGNOSIS — Z8669 Personal history of other diseases of the nervous system and sense organs: Secondary | ICD-10-CM | POA: Diagnosis not present

## 2015-10-09 LAB — MICROALBUMIN, URINE: Microalbumin Ur, POC: 117 mg/L

## 2015-10-22 ENCOUNTER — Other Ambulatory Visit: Payer: Self-pay | Admitting: Physician Assistant

## 2015-10-23 MED FILL — SIMVASTATIN 40 MG TABLET: 40 | 15 days supply | Qty: 15 | Fill #0

## 2015-10-23 MED FILL — INVOKAMET 50-1,000 MG TAB: 50-1000 | 30 days supply | Qty: 60 | Fill #1

## 2015-10-23 MED FILL — CIALIS 5 MG TABLET: 5 | 15 days supply | Qty: 15 | Fill #0

## 2015-10-28 ENCOUNTER — Telehealth: Payer: Self-pay | Admitting: Family Medicine

## 2015-10-28 NOTE — Telephone Encounter (Signed)
I called pt and left a voicemail to let him know he is due for a f/u appt with Dr. Madilyn Fireman for his Cholesterol and asked him to call the office to schedule the appt. Thanks

## 2015-11-14 MED FILL — SIMVASTATIN 40 MG TABLET: 40 | 90 days supply | Qty: 90 | Fill #0

## 2015-11-20 MED FILL — INVOKAMET 50-1,000 MG TAB: 50-1000 | 30 days supply | Qty: 60 | Fill #2

## 2015-11-20 MED FILL — LISINOPRIL 5 MG TABLET: 5 | 90 days supply | Qty: 90 | Fill #1

## 2015-12-24 MED FILL — INVOKAMET 50-1,000 MG TAB: 50-1000 | 30 days supply | Qty: 60 | Fill #3

## 2015-12-24 MED FILL — CIALIS 5 MG TABLET: 5 | 15 days supply | Qty: 15 | Fill #1

## 2016-01-24 MED FILL — INVOKAMET 50-1,000 MG TAB: 50-1000 | 30 days supply | Qty: 60 | Fill #4

## 2016-02-17 MED FILL — INVOKAMET 50-1,000 MG TAB: 50-1000 | 30 days supply | Qty: 60 | Fill #5

## 2016-02-17 MED FILL — SIMVASTATIN 40 MG TABLET: 40 | 90 days supply | Qty: 90 | Fill #1

## 2016-02-17 MED FILL — LISINOPRIL 5 MG TAB: 5 | 90 days supply | Qty: 90 | Fill #0

## 2016-03-25 MED FILL — INVOKAMET 50-1,000 MG TAB: 50-1000 | 30 days supply | Qty: 60 | Fill #0

## 2016-03-27 DIAGNOSIS — G35 Multiple sclerosis: Secondary | ICD-10-CM | POA: Diagnosis not present

## 2016-03-27 DIAGNOSIS — E119 Type 2 diabetes mellitus without complications: Secondary | ICD-10-CM | POA: Diagnosis not present

## 2016-03-27 DIAGNOSIS — H04123 Dry eye syndrome of bilateral lacrimal glands: Secondary | ICD-10-CM | POA: Diagnosis not present

## 2016-03-27 DIAGNOSIS — H524 Presbyopia: Secondary | ICD-10-CM | POA: Diagnosis not present

## 2016-03-31 ENCOUNTER — Emergency Department (INDEPENDENT_AMBULATORY_CARE_PROVIDER_SITE_OTHER)
Admission: EM | Admit: 2016-03-31 | Discharge: 2016-03-31 | Disposition: A | Discharge status: Home / Self Care | Payer: BLUE CROSS/BLUE SHIELD | Source: Home / Self Care | Attending: Family Medicine | Admitting: Family Medicine

## 2016-03-31 ENCOUNTER — Encounter: Payer: Self-pay | Admitting: *Deleted

## 2016-03-31 DIAGNOSIS — J069 Acute upper respiratory infection, unspecified: Secondary | ICD-10-CM

## 2016-03-31 HISTORY — DX: Type 2 diabetes mellitus without complications: E11.9

## 2016-03-31 HISTORY — DX: Essential (primary) hypertension: I10

## 2016-03-31 MED ORDER — AZITHROMYCIN 250 MG PO TABS: 250.0000 mg | ORAL_TABLET | Freq: Every day | ORAL | 0 refills | Status: DC

## 2016-03-31 MED FILL — AZITHROMYCIN 250 MG TABLET: 250 | 5 days supply | Qty: 6 | Fill #0

## 2016-03-31 NOTE — ED Provider Notes (Signed)
CSN: [id number]     Arrival date & time 03/31/16  1449 History   First MD Initiated Contact with Patient 03/31/16 1541     Chief Complaint  Patient presents with  . Cough  . Generalized Body Aches  . Nasal Congestion   (Consider location/radiation/quality/duration/timing/severity/associated sxs/prior Treatment) HPI [name] is a 46 y.o. male presenting to UC with c/o 9 days of gradually worsening nasal congestion, body aches, mild fatigue, and mild cough now developing into productive cough.  He has taken sudafed, ibuprofen and tylenol OTC with mild relief.  Others have had colds around him.  He initially thought he was getting the flu but never developed a fever. Denies chest pain or SOB.   Past Medical History:  Diagnosis Date  . Diabetes mellitus without complication ([place])   . Hypertension   . Multiple sclerosis (HCC) 1999   Dr Trula Ore  . Nephrolithiasis    sees urology   Past Surgical History:  Procedure Laterality Date  . APPENDECTOMY  2003   Family History  Problem Relation Age of Onset  . Hypertension Mother   . Alcohol abuse Father   . Cancer Maternal Grandfather   . Diabetes Maternal Aunt    Social History  Substance Use Topics  . Smoking status: Former Smoker    Packs/day: 1.00    Types: Cigarettes    Quit date: 10/28/2011  . Smokeless tobacco: Never Used  . Alcohol use No    Review of Systems  Constitutional: Positive for fatigue. Negative for chills and fever.  HENT: Positive for congestion, rhinorrhea and sinus pressure. Negative for ear pain, sinus pain, sore throat, trouble swallowing and voice change.   Respiratory: Positive for cough. Negative for shortness of breath.   Cardiovascular: Negative for chest pain and palpitations.  Gastrointestinal: Negative for abdominal pain, diarrhea, nausea and vomiting.  Musculoskeletal: Positive for arthralgias and myalgias. Negative for back pain.  Skin: Negative for rash.    Allergies  Patient has no  known allergies.  Home Medications   Prior to Admission medications   Medication Sig Start Date End Date Taking? Authorizing Provider  aspirin 81 MG chewable tablet Chew 81 mg by mouth.   Yes Historical Provider, MD  INVOKAMET 50-1000 MG TABS  03/20/15  Yes Historical Provider, MD  lisinopril (PRINIVIL,ZESTRIL) 5 MG tablet  01/09/14  Yes Historical Provider, MD  simvastatin (ZOCOR) 40 MG tablet TAKE 1 TABLET BY MOUTH DAILY AT 6:00 PM **NEED LABS DRAWN** 10/23/15  Yes Jade L Breeback, PA-C  azithromycin (ZITHROMAX) 250 MG tablet Take 1 tablet (250 mg total) by mouth daily. Take first 2 tablets together, then 1 every day until finished. 03/31/16   Noland Fordyce, PA-C  blood glucose meter kit and supplies KIT Dispense based on patient and insurance preference. Use up to three times daily as directed. DX: E11.9 05/02/15   Hali Marry, MD  CIALIS 5 MG tablet  02/05/15   Historical Provider, MD  glucose blood (BAYER CONTOUR NEXT TEST) test strip Use to monitor blood sugar testing two times daily. Dx: E13.9 09/20/15   Hali Marry, MD  metFORMIN (GLUCOPHAGE) 1000 MG tablet Take 1 tablet by mouth twice daily with a meal 02/12/14   Hali Marry, MD   Meds Ordered and Administered this Visit  Medications - No data to display  BP 132/87 (BP Location: Left Arm)   Pulse 80   Temp 98.1 F (36.7 C) (Oral)   Ht 5' 11"  (1.803 m)   Wt  180 lb (81.6 kg)   SpO2 98%   BMI 25.10 kg/m  No data found.   Physical Exam  Constitutional: He appears well-developed and well-nourished. No distress.  HENT:  Head: Normocephalic and atraumatic.  Right Ear: Tympanic membrane normal.  Left Ear: Tympanic membrane normal.  Nose: Nose normal.  Mouth/Throat: Uvula is midline, oropharynx is clear and moist and mucous membranes are normal.  Eyes: Conjunctivae are normal. No scleral icterus.  Neck: Normal range of motion. Neck supple.  Cardiovascular: Normal rate, regular rhythm and normal heart sounds.    Pulmonary/Chest: Effort normal and breath sounds normal. No stridor. No respiratory distress. He has no wheezes. He has no rales.  Abdominal: Soft. He exhibits no distension and no mass. There is no tenderness. There is no rebound and no guarding.  Musculoskeletal: Normal range of motion.  Lymphadenopathy:    He has no cervical adenopathy.  Neurological: He is alert.  Skin: Skin is warm and dry. He is not diaphoretic.  Nursing note and vitals reviewed.   Urgent Care Course     Procedures (including critical care time)  Labs Review Labs Reviewed - No data to display  Imaging Review No results found.    MDM   1. Upper respiratory tract infection, unspecified type    Pt c/o worsening URI symptoms for 9 days. Will cover for atypical bacteria Rx: azithromycin   F/u with PCP in 1 week if not improving.    Noland Fordyce, PA-C 03/31/16 1629

## 2016-03-31 NOTE — ED Triage Notes (Signed)
Patient c/o nasal congestion x 9 days. More recently developed productive cough and body aches. Afebrile. Taken Sudafed, IBF and Tylenol otc.

## 2016-04-09 DIAGNOSIS — E119 Type 2 diabetes mellitus without complications: Secondary | ICD-10-CM | POA: Diagnosis not present

## 2016-04-16 ENCOUNTER — Ambulatory Visit (INDEPENDENT_AMBULATORY_CARE_PROVIDER_SITE_OTHER): Payer: BLUE CROSS/BLUE SHIELD | Admitting: Family Medicine

## 2016-04-16 ENCOUNTER — Encounter: Payer: Self-pay | Admitting: Family Medicine

## 2016-04-16 VITALS — BP 122/70 | HR 82 | Ht 71.0 in | Wt 180.0 lb

## 2016-04-16 DIAGNOSIS — E119 Type 2 diabetes mellitus without complications: Secondary | ICD-10-CM | POA: Insufficient documentation

## 2016-04-16 DIAGNOSIS — Z0000 Encounter for general adult medical examination without abnormal findings: Secondary | ICD-10-CM | POA: Diagnosis not present

## 2016-04-16 DIAGNOSIS — E1121 Type 2 diabetes mellitus with diabetic nephropathy: Secondary | ICD-10-CM | POA: Diagnosis not present

## 2016-04-16 DIAGNOSIS — Z23 Encounter for immunization: Secondary | ICD-10-CM | POA: Diagnosis not present

## 2016-04-16 LAB — POCT UA - MICROALBUMIN
Creatinine, POC: 200 mg/dL
Microalbumin Ur, POC: 80 mg/L

## 2016-04-16 LAB — HEMOGLOBIN A1C: HEMOGLOBIN A1C: 6.8

## 2016-04-16 NOTE — Progress Notes (Signed)
Subjective:    CC: CPE  HPI:\  46 yo male here for CPE today.  No specific complaints or problems. He isn't following with endocrinology for his type 1 diabetes.  BP 122/70   Pulse 82   Ht 5' 11"  (1.803 m)   Wt 180 lb (81.6 kg)   SpO2 100%   BMI 25.10 kg/m     No Known Allergies  Past Medical History:  Diagnosis Date  . Diabetes mellitus without complication ([place])   . Hypertension   . Multiple sclerosis (HCC) 1999   Dr Trula Ore  . Nephrolithiasis    sees urology    Past Surgical History:  Procedure Laterality Date  . APPENDECTOMY  2003    Social History   Social History  . Marital status: Married    Spouse name: christy  . Number of children: 2  . Years of education: N/A   Occupational History  . 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 Smoking status: Former Smoker    Packs/day: 1.00    Types: Cigarettes    Quit date: 10/28/2011  . Smokeless tobacco: Never Used  . Alcohol use No  . Drug use: No  . Sexual activity: Yes   Other Topics Concern  . Not on file   Social History Narrative  . No narrative on file    Family History  Problem Relation Age of Onset  . Hypertension Mother   . Alcohol abuse Father   . Cancer Maternal Grandfather   . Diabetes Maternal Aunt     Outpatient Encounter Prescriptions as of 04/16/2016  Medication Sig  . aspirin 81 MG chewable tablet Chew 81 mg by mouth.  Marland Kitchen azithromycin (ZITHROMAX) 250 MG tablet Take 1 tablet (250 mg total) by mouth daily. Take first 2 tablets together, then 1 every day until finished.  . blood glucose meter kit and supplies KIT Dispense based on patient and insurance preference. Use up to three times daily as directed. DX: E11.9  . CIALIS 5 MG tablet   . glucose blood (BAYER CONTOUR NEXT TEST) test strip Use to monitor blood sugar testing two times daily. Dx: E13.9  . INVOKAMET 50-1000 MG TABS   . lisinopril (PRINIVIL,ZESTRIL) 5 MG tablet   . metFORMIN (GLUCOPHAGE) 1000 MG  tablet Take 1 tablet by mouth twice daily with a meal  . simvastatin (ZOCOR) 40 MG tablet TAKE 1 TABLET BY MOUTH DAILY AT 6:00 PM **NEED LABS DRAWN**   No facility-administered encounter medications on file as of 04/16/2016.      Review of Systems: No fevers, chills, night sweats, weight loss, chest pain, or shortness of breath.   Objective:    General: Well Developed, well nourished, and in no acute distress.  Neuro: Alert and oriented x3, extra-ocular muscles intact, sensation grossly intact.  HEENT: Normocephalic, atraumatic  Skin: Warm and dry, no rashes. Cardiac: Regular rate and rhythm, no murmurs rubs or gallops, no lower extremity edema.  Respiratory: Clear to auscultation bilaterally. Not using accessory muscles, speaking in full sentences.   Impression and Recommendations:    WEllness exam.  Keep up a regular exercise program and make sure you are eating a healthy diet Try to eat 4 servings of dairy a day, or if you are lactose intolerant take a calcium with vitamin D daily.  Your vaccines are up to date.    Given Pneumovax 23 today.     Will call Dr. Annia Belt care for  updated eye exam.

## 2016-04-16 NOTE — Patient Instructions (Signed)
Keep up a regular exercise program and make sure you are eating a healthy diet Try to eat 4 servings of dairy a day, or if you are lactose intolerant take a calcium with vitamin D daily.  Your vaccines are up to date.   

## 2016-04-24 MED FILL — INVOKAMET 50-1,000 MG TAB: 50-1000 | 30 days supply | Qty: 60 | Fill #1

## 2016-04-30 DIAGNOSIS — E119 Type 2 diabetes mellitus without complications: Secondary | ICD-10-CM | POA: Diagnosis not present

## 2016-05-11 MED FILL — SIMVASTATIN 40 MG TABLET: 40 | 90 days supply | Qty: 90 | Fill #0

## 2016-05-11 MED FILL — LISINOPRIL 5 MG TABLET: 5 | 90 days supply | Qty: 90 | Fill #1

## 2016-05-15 DIAGNOSIS — N5239 Other post-surgical erectile dysfunction: Secondary | ICD-10-CM | POA: Diagnosis not present

## 2016-05-15 DIAGNOSIS — N200 Calculus of kidney: Secondary | ICD-10-CM | POA: Diagnosis not present

## 2016-05-15 DIAGNOSIS — R350 Frequency of micturition: Secondary | ICD-10-CM | POA: Diagnosis not present

## 2016-05-15 DIAGNOSIS — N400 Enlarged prostate without lower urinary tract symptoms: Secondary | ICD-10-CM | POA: Diagnosis not present

## 2016-05-28 MED FILL — INVOKAMET 50-1,000 MG TAB: 50-1000 | 30 days supply | Qty: 60 | Fill #2

## 2016-06-22 MED FILL — INVOKAMET 50-1,000 MG TAB: 50-1000 | 30 days supply | Qty: 60 | Fill #3

## 2016-07-27 MED FILL — INVOKAMET 50-1,000 MG TAB: 50-1000 | 30 days supply | Qty: 60 | Fill #4

## 2016-08-28 MED FILL — LISINOPRIL 5 MG TAB: 5 | 90 days supply | Qty: 90 | Fill #0

## 2016-08-28 MED FILL — INVOKAMET 50-1,000 MG TAB: 50-1000 | 30 days supply | Qty: 60 | Fill #5

## 2016-08-28 MED FILL — SIMVASTATIN 40 MG TABLET: 40 | 90 days supply | Qty: 90 | Fill #1

## 2016-09-17 ENCOUNTER — Encounter: Payer: Self-pay | Admitting: *Deleted

## 2016-09-17 ENCOUNTER — Emergency Department (INDEPENDENT_AMBULATORY_CARE_PROVIDER_SITE_OTHER)
Admission: EM | Admit: 2016-09-17 | Discharge: 2016-09-17 | Disposition: A | Discharge status: Home / Self Care | Payer: BLUE CROSS/BLUE SHIELD | Source: Home / Self Care

## 2016-09-17 DIAGNOSIS — S161XXA Strain of muscle, fascia and tendon at neck level, initial encounter: Secondary | ICD-10-CM

## 2016-09-17 NOTE — Discharge Instructions (Signed)
Start ibuprofen 600mg  three times a day for 10 days for inflammation. Heat compress/massage to help with muscle tightness. This can take up to 2-3 weeks to completely resolve, but you should be feeling better each week. Monitor for any worsening of symptoms, spreading redness, increased warmth, numbness/tingling, follow up here or with PCP for reevaluation.

## 2016-09-17 NOTE — ED Provider Notes (Signed)
[facility]    CSN: [id number] Arrival date & time: 09/17/16  [place]     History   Chief Complaint Chief Complaint  Patient presents with  . Torticollis    HPI [name] is a 46 y.o. male.   46 year old male with history of diabetes, hypertension, multiple sclerosis comes in with 5 day history of neck tightness/lump on neck after sleeping in a weird position. He states it is not painful, but just feels off. Denies injury, numbness/tingling. He has not taken anything for it. He states symptoms are worse/ has some discomfort when laying a certain position. He does not have any trouble moving his neck. Denies URI symptoms such as fever, cough, sore throat, congestion. His diabetes is well controlled, with last a1c below 7.       Past Medical History:  Diagnosis Date  . Diabetes mellitus without complication ([place])   . Hypertension   . Multiple sclerosis (HCC) 1999   Dr Trula Ore  . Nephrolithiasis    sees urology    Patient Active Problem List   Diagnosis Date Noted  . Diabetes mellitus, type 2 ([place]) 04/16/2016  . Hypertriglyceridemia 02/12/2014  . Calculus of kidney 04/03/2013  . DS (disseminated sclerosis) ([place]) 04/03/2013  . Other and unspecified hyperlipidemia 01/02/2013  . LADA (latent autoimmune diabetes in adults), managed as type 1 ([place]) 11/11/2012    Past Surgical History:  Procedure Laterality Date  . APPENDECTOMY  2003  . LITHOTRIPSY         Home Medications    Prior to Admission medications   Medication Sig Start Date End Date Taking? Authorizing Provider  aspirin 81 MG chewable tablet Chew 81 mg by mouth.    [provider]  blood glucose meter kit and supplies KIT Dispense based on patient and insurance preference. Use up to three times daily as directed. DX: E11.9 05/02/15   Hali Marry, MD  glucose blood (BAYER CONTOUR NEXT TEST) test strip Use to monitor blood sugar testing two times daily. Dx: E13.9 09/20/15    Hali Marry, MD  INVOKAMET 50-1000 MG TABS  03/20/15   [provider]  lisinopril (PRINIVIL,ZESTRIL) 5 MG tablet  01/09/14   [provider]  metFORMIN (GLUCOPHAGE) 1000 MG tablet Take 1 tablet by mouth twice daily with a meal 02/12/14   Hali Marry, MD  simvastatin (ZOCOR) 40 MG tablet TAKE 1 TABLET BY MOUTH DAILY AT 6:00 PM **NEED LABS DRAWN** 10/23/15   Donella Stade, PA-C    Family History Family History  Problem Relation Age of Onset  . Hypertension Mother   . Alcohol abuse Father   . Cancer Maternal Grandfather   . Diabetes Maternal Aunt     Social History Social History  Substance Use Topics  . Smoking status: Former Smoker    Packs/day: 1.00    Types: Cigarettes    Quit date: 10/28/2011  . Smokeless tobacco: Never Used  . Alcohol use No     Allergies   Patient has no known allergies.   Review of Systems Review of Systems  Reason unable to perform ROS: See HPI as above.     Physical Exam Triage Vital Signs ED Triage Vitals [09/17/16 1844]  Enc Vitals Group     BP (!) 138/96     Pulse Rate 90     Resp 16     Temp 97.9 F (36.6 C)     Temp Source Oral  SpO2 98 %     Weight 177 lb (80.3 kg)     Height 5' 11"  (1.803 m)     Head Circumference      Peak Flow      Pain Score 0     Pain Loc      Pain Edu?      Excl. in [place]?    No data found.   Updated Vital Signs BP (!) 138/96 (BP Location: Left Arm)   Pulse 90   Temp 97.9 F (36.6 C) (Oral)   Resp 16   Ht 5' 11"  (1.803 m)   Wt 177 lb (80.3 kg)   SpO2 98%   BMI 24.69 kg/m    Physical Exam  Constitutional: He is oriented to person, place, and time. He appears well-developed and well-nourished. No distress.  HENT:  Head: Normocephalic and atraumatic.  Eyes: Pupils are equal, round, and reactive to light. Conjunctivae are normal.  Neck: Normal range of motion. Neck supple. No spinous process tenderness and no muscular tenderness present. Normal range of  motion present.  Slightly enlarged sternocleidomastoid muscle on the right. No obvious spasms felt.   Cardiovascular: Normal rate, regular rhythm and normal heart sounds.  Exam reveals no gallop and no friction rub.   No murmur heard. Pulmonary/Chest: Effort normal and breath sounds normal. He has no wheezes. He has no rales.  Musculoskeletal:  No tenderness on palpation of the shoulders. Full range of motion. Strength normal and equal bilaterally. Sensation intact and equal bilaterally.  Neurological: He is alert and oriented to person, place, and time.  Skin: Skin is warm and dry.     UC Treatments / Results  Labs (all labs ordered are listed, but only abnormal results are displayed) Labs Reviewed - No data to display  EKG  EKG Interpretation None       Radiology No results found.  Procedures Procedures (including critical care time)  Medications Ordered in UC Medications - No data to display   Initial Impression / Assessment and Plan / UC Course  I have reviewed the triage vital signs and the nursing notes.  Pertinent labs & imaging results that were available during my care of the patient were reviewed by me and considered in my medical decision making (see chart for details).    Discussed with patient history and exam most consistent with muscle strain. Start NSAID as directed for pain and inflammation.Ice/heat compresses. Discussed with patient strain can take up to 3-4 weeks to resolve, but should be getting better each week. Return precautions given.     Final Clinical Impressions(s) / UC Diagnoses   Final diagnoses:  Strain of neck muscle, initial encounter    New Prescriptions Discharge Medication List as of 09/17/2016  7:04 PM         Ok Edwards, PA-C 09/17/16 1912

## 2016-09-17 NOTE — ED Triage Notes (Signed)
Pt c/o a crick in the RT side of his neck after taking a nap 5 days ago, he now c/o a lump on the RT side of his neck. Denies fever or recent URI.

## 2016-09-30 MED FILL — INVOKAMET 50-1,000 MG TAB: 50-1000 | 30 days supply | Qty: 60 | Fill #0

## 2016-10-12 DIAGNOSIS — E782 Mixed hyperlipidemia: Secondary | ICD-10-CM | POA: Diagnosis not present

## 2016-10-12 DIAGNOSIS — Z23 Encounter for immunization: Secondary | ICD-10-CM | POA: Diagnosis not present

## 2016-10-12 DIAGNOSIS — E119 Type 2 diabetes mellitus without complications: Secondary | ICD-10-CM | POA: Diagnosis not present

## 2016-10-12 DIAGNOSIS — E1169 Type 2 diabetes mellitus with other specified complication: Secondary | ICD-10-CM | POA: Diagnosis not present

## 2016-10-12 LAB — HEMOGLOBIN A1C: HEMOGLOBIN A1C: 6.5

## 2016-10-12 LAB — HM DIABETES FOOT EXAM: HM DIABETIC FOOT EXAM: NORMAL

## 2016-10-12 LAB — LIPID PANEL
CHOLESTEROL: 121 (ref 0–200)
HDL: 22 — AB (ref 35–70)
LDL Cholesterol: 56
Triglycerides: 215 — AB (ref 40–160)

## 2016-10-27 MED FILL — INVOKAMET 50-1,000 MG TAB: 50-1000 | 30 days supply | Qty: 60 | Fill #1

## 2016-11-24 MED FILL — SIMVASTATIN 40 MG TABLET: 40 | 90 days supply | Qty: 90 | Fill #0

## 2016-11-24 MED FILL — INVOKAMET 50-1,000 MG TAB: 50-1000 | 30 days supply | Qty: 60 | Fill #2

## 2016-11-24 MED FILL — LISINOPRIL 5 MG TAB: 5 | 90 days supply | Qty: 90 | Fill #1

## 2016-12-30 MED FILL — INVOKAMET 50-1,000 MG TAB: 50-1000 | 30 days supply | Qty: 60 | Fill #3

## 2017-02-01 MED FILL — INVOKAMET 50-1,000 MG TAB: 50-1000 | 30 days supply | Qty: 60 | Fill #4

## 2017-03-02 MED FILL — SIMVASTATIN 40 MG TABLET: 40 | 90 days supply | Qty: 90 | Fill #1

## 2017-03-02 MED FILL — LISINOPRIL 5 MG TAB: 5 | 90 days supply | Qty: 90 | Fill #0

## 2017-03-02 MED FILL — INVOKAMET 50-1,000 MG TAB: 50-1000 | 30 days supply | Qty: 60 | Fill #5

## 2017-04-02 MED FILL — INVOKAMET 50-1,000 MG TAB: 50-1000 | 30 days supply | Qty: 60 | Fill #0

## 2017-04-16 DIAGNOSIS — Z7984 Long term (current) use of oral hypoglycemic drugs: Secondary | ICD-10-CM | POA: Diagnosis not present

## 2017-04-16 DIAGNOSIS — H524 Presbyopia: Secondary | ICD-10-CM | POA: Diagnosis not present

## 2017-04-16 DIAGNOSIS — E119 Type 2 diabetes mellitus without complications: Secondary | ICD-10-CM | POA: Diagnosis not present

## 2017-04-16 DIAGNOSIS — H04123 Dry eye syndrome of bilateral lacrimal glands: Secondary | ICD-10-CM | POA: Diagnosis not present

## 2017-05-07 MED FILL — INVOKAMET 50-1,000 MG TAB: 50-1000 | 30 days supply | Qty: 60 | Fill #1

## 2017-06-04 MED FILL — INVOKAMET 50-1,000 MG TAB: 50-1000 | 30 days supply | Qty: 60 | Fill #2

## 2017-06-04 MED FILL — SIMVASTATIN 40 MG TABLET: 40 | 90 days supply | Qty: 90 | Fill #0

## 2017-06-04 MED FILL — LISINOPRIL 5 MG TABLET: 5 | 90 days supply | Qty: 90 | Fill #1

## 2017-06-08 DIAGNOSIS — R350 Frequency of micturition: Secondary | ICD-10-CM | POA: Diagnosis not present

## 2017-06-08 DIAGNOSIS — N401 Enlarged prostate with lower urinary tract symptoms: Secondary | ICD-10-CM | POA: Diagnosis not present

## 2017-06-08 DIAGNOSIS — N200 Calculus of kidney: Secondary | ICD-10-CM | POA: Diagnosis not present

## 2017-06-08 DIAGNOSIS — N529 Male erectile dysfunction, unspecified: Secondary | ICD-10-CM | POA: Diagnosis not present

## 2017-07-06 MED FILL — INVOKAMET 50-1,000 MG TAB: 50-1000 | 30 days supply | Qty: 60 | Fill #3

## 2017-08-03 ENCOUNTER — Ambulatory Visit (INDEPENDENT_AMBULATORY_CARE_PROVIDER_SITE_OTHER): Payer: BLUE CROSS/BLUE SHIELD

## 2017-08-03 ENCOUNTER — Encounter: Payer: Self-pay | Admitting: Family Medicine

## 2017-08-03 ENCOUNTER — Ambulatory Visit (INDEPENDENT_AMBULATORY_CARE_PROVIDER_SITE_OTHER): Payer: BLUE CROSS/BLUE SHIELD | Admitting: Family Medicine

## 2017-08-03 VITALS — BP 141/86 | HR 97 | Wt 173.0 lb

## 2017-08-03 DIAGNOSIS — M542 Cervicalgia: Secondary | ICD-10-CM

## 2017-08-03 DIAGNOSIS — R29898 Other symptoms and signs involving the musculoskeletal system: Secondary | ICD-10-CM

## 2017-08-03 DIAGNOSIS — M5412 Radiculopathy, cervical region: Secondary | ICD-10-CM

## 2017-08-03 DIAGNOSIS — M79604 Pain in right leg: Secondary | ICD-10-CM | POA: Diagnosis not present

## 2017-08-03 NOTE — Progress Notes (Signed)
[name] is a 47 y.o. male who presents to [place]: [place] today for right-sided leg weakness and right arm pain.  [name] has a confusing past medical history concerning for possible multiple sclerosis.  He said symptoms 20 years ago with MRI changes somewhat concerning for MS.  However subsequent MRI most recently 2010 did show some white matter changes but not consistent with MS.  He is not been on any treatment for MS and not had many symptoms.  Prior to his current episode of symptoms today he was feeling pretty well with out any weakness or pain.  He notes pain in the right arm and shoulder present for a few months.  He denies any injury.  He notes some pain with overhead motion and with arm motion.  He notes some pain that radiates down to his second and third digits of his right hand.  He notes neck motion tends to worsen his pain radiating down his arm.  He denies any weakness or numbness into his right arm.  He denies any injury history.  He is tried some over-the-counter medications which helped a little.  However brining notes today new right leg weakness.  He notes his leg feels a bit weak especially with descending stairs.  He denies any injury or pain.  He denies any numbness.  He denies any bowel bladder dysfunction.  No injury history.  No fevers or chills.   ROS as above:  Exam:  BP (!) 141/86   Pulse 97   Wt 173 lb (78.5 kg)   BMI 24.13 kg/m  Gen: Well NAD HEENT: EOMI,  MMM Lungs: Normal work of breathing. CTABL Heart: RRR no MRG Abd: NABS, Soft. Nondistended, Nontender MSK: C-spine: Normal-appearing nontender normal motion.  Negative Spurling's test. Upper Extremities:  Shoulder abduction 5/5 bilaterally  Elbow flexion 5/5 bilaterally  Elbow extension 5/5 bilaterally  Wrist extension 5/5 bilaterally  Wrist flexion 5/5 bilaterally  Biceps and  brachioradialis reflexes 3+ bilaterally  Pulses and capillary refill are intact.  Sensation is intact throughout.  L-spine: Nontender normal back motion.  Normal gait. Lower Extremities : Hip flexion 4+/5 R 5/5 L Hip abduction 5/5 R 5/5 L Hip adduction 5/5 R 5/5 L Knee Flexion 4+/5 R 5/5 L Knee Extension 5/5 R 5/5 L Foot dorsiflexion 5/5 R 5/5 L Foot plantarflexion 5/5 R 5/5 L Patellar and achilles reflexes 3+ bilaterally  Pulses and capillary refill are intact bilaterally. Sensation is intact throughout. Reflexes 3+ bilateral knees and ankles without clonus.    Lab and Radiology Results MRI report brain and C-spine reviewed in care everywhere  ORDERING: PATIENT: [name], [name]  DOB: [id number] SERVICE DATE: [id number] MRN: [id number]  CC:  EXAM: MR-Brain With and Without Contrast  REASON FOR VISIT: bil. upper extremit  CI NUMBER: [id number]  TECHNIQUE: MRI of the brain is performed with and without 20 mL of  Multihance. Comparison examination 02/07/2008.   INDICATION: A lateral upper extremity numbness.   FINDINGS: The pituitary gland, cerebellar tonsils, corpus callosum and  other midline structures are normal. The there is no acute infarct by  diffusion-weighted imaging. The previously seen plaque superior lateral  to the body of the right lateral ventricle is reduced in size. Other  periventricular lesions are similar. The pattern of labeling in the lower  part of the corpus callosum is consistent with the patients known history  of multiple sclerosis. No new brain or  upper brainstem lesions are  identified. No enhancing lesions are seen today. The major cervical  vessels and dural venous sinuses are patent. Orbits, paranasal sinuses  and temporal bones are normal.   IMPRESSION: Interval improvement in the right paraventricular plaque.  Other findings are stable compared to previous studies. No new plaques or  other lesions are  seen.  Thank you for your referral,  Electronically Signed By Mikle Bosworth  DOB: [id number] SERVICE DATE: [id number] MRN: [id number]  CC:  EXAM: MR-Cervical Spine With and Without Contrast  REASON FOR VISIT: bil. upper extremit  CI NUMBER: [id number]  TECHNIQUE: MRI of the cervical spine was performed with and without 20 mL  of Multihance.   INDICATION: Bilateral upper extremity numbness.   FINDINGS: Craniovertebral and cervicomedullary junctions are normal.  Posterior bony alignment cervical spine is normal. Marrow signal is  unremarkable. Cervical cord shows multiple patchy areas of abnormal T2  signal on STIR imaging , that are not well defined on routine T2 sagittal  images. No enhancing lesions are seen in the lower brainstem or the cord.  No intradural mass lesion.   C2-C3: Normal.   C3-C4: Normal.   C4-C5: Tiny central protrusion. No stenosis or root compression. There is  a focal area of abnormal T2 signal in the posterior lateral funiculus on  the right. This does not enhance strongly. Some weak enhancement may be  present.   C5-C6: Slightly eccentric bulge of the disc towards the right  uncovertebral region. No significant central stenosis. Minimal foraminal  narrowing on the right without definite root compression. No definite  cord abnormality.   C6-C7: No significant posterior disc abnormality. Neuroforamen are patent  but is slightly narrowed on the right. Mild facet hypertrophy. Minimal T2  date signal abnormalities cord at this level.   C7-T1: Disc is normal. Neuroforamen are patent despite some uncovertebral  spurring on the right. Possible patchy T2 abnormality in the superior  lateral funiculus on the left.   Behind T2, there is a large area of signal abnormality in the right half  of the cord.   IMPRESSION: STIR images, and axial T2 MEDIC images show multiple patchy  areas of T2 signal abnormality in the cervical upper  thoracic cord. None  of these appear to enhance with contrast administration, except possibly  posterior laterally on the left at C7-T1. Findings are consistent  multiple sclerosis.  Thank you for your referral,  Electronically Signed By Geni Bers lumbar spine images personally independently reviewed today No acute changes.  No significant or severe generative changes. Awaiting formal radiology review   Assessment and Plan: 47 y.o. male with  Right arm pain is concerning for C7 radiculopathy.  Symptoms are pretty mild at this time.  Before we start steroids and gabapentin trial I would like to see if the leg weakness is going to resolve.  If not we will proceed with MRI of the spine to evaluate for MS and referral to neurology.  Steroids may interfere with the future work-up of possible MS flare.  His symptoms are quite mild at this time and not the main reason he is here.  Watchful waiting trial of physical therapy and recheck in the near future.  Right leg weakness: Unclear etiology.  Patient does have some very subtle weakness especially with right hip flexion and knee extension.  Given his history of possible MS that is obviously concerning factor.  Additionally he is a bit hyperreflexic today but does  not have clonus and his reflexes are symmetrical.  Plan for physical therapy and watchful waiting.  Recheck next week.  If worsening will proceed with MRI evaluation of brain and spine for MS and lumbar spine for possible L2 or 3 lumbar radiculopathy.   Orders Placed This Encounter  Procedures  . DG Lumbar Spine Complete    Standing Status:   Future    Number of Occurrences:   1    Standing Expiration Date:   10/05/2018    Order Specific Question:   Reason for Exam (SYMPTOM  OR DIAGNOSIS REQUIRED)    Answer:   eval pain lspine    Order Specific Question:   Preferred imaging location?    Answer:   [facility]    Order Specific Question:   Radiology Contrast  Protocol - do NOT remove file path    Answer:   [email or web address]  . Ambulatory referral to Physical Therapy    Referral Priority:   Routine    Referral Type:   Physical Medicine    Referral Reason:   Specialty Services Required    Requested Specialty:   Physical Therapy   No orders of the defined types were placed in this encounter.    Historical information moved to improve visibility of documentation.  Past Medical History:  Diagnosis Date  . Diabetes mellitus without complication ([place])   . Hypertension   . Multiple sclerosis (HCC) 1999   Dr Trula Ore  . Nephrolithiasis    sees urology   Past Surgical History:  Procedure Laterality Date  . APPENDECTOMY  2003  . LITHOTRIPSY     Social History   Tobacco Use  . Smoking status: Former Smoker    Packs/day: 1.00    Types: Cigarettes    Last attempt to quit: 10/28/2011    Years since quitting: 5.7  . Smokeless tobacco: Never Used  Substance Use Topics  . Alcohol use: No   family history includes Alcohol abuse in his father; Cancer in his maternal grandfather; Diabetes in his maternal aunt; Hypertension in his mother.  Medications: Current Outpatient Medications  Medication Sig Dispense Refill  . aspirin 81 MG chewable tablet Chew 81 mg by mouth.    . blood glucose meter kit and supplies KIT Dispense based on patient and insurance preference. Use up to three times daily as directed. DX: E11.9 1 each 0  . glucose blood (BAYER CONTOUR NEXT TEST) test strip Use to monitor blood sugar testing two times daily. Dx: E13.9 100 each 12  . INVOKAMET 50-1000 MG TABS   5  . lisinopril (PRINIVIL,ZESTRIL) 5 MG tablet   2  . metFORMIN (GLUCOPHAGE) 1000 MG tablet Take 1 tablet by mouth twice daily with a meal 60 tablet 6  . simvastatin (ZOCOR) 40 MG tablet TAKE 1 TABLET BY MOUTH DAILY AT 6:00 PM **NEED LABS DRAWN** 15 tablet 0   No current facility-administered medications for this visit.    No  Known Allergies   Discussed warning signs or symptoms. Please see discharge instructions. Patient expresses understanding. I personally was present and performed or re-performed the history, physical exam and medical decision-making activities of this service and have verified that the service and findings are accurately documented in the student's note. ___________________________________________ Lynne Leader M.D., ABFM., CAQSM. Primary Care and Sports Medicine Adjunct Instructor of [employer]

## 2017-08-03 NOTE — Patient Instructions (Addendum)
Thank you for coming in today. Attend PT.  Recheck with me Monday or Tuesday next week.  Return or contact me sooner if worsening.   Next step is MRI evaluation of spine.  We are always concerned for MS but your exam is reassuring.   Multiple Sclerosis Multiple sclerosis (MS) is a disease of the central nervous system. It leads to the loss of the insulating covering of the nerves (myelin sheath) of your brain. When this happens, brain signals do not get sent properly or may not get sent at all. The age of onset of MS varies. What are the causes? The cause of MS is unknown. However, it is more common in the [place] than in the [place]. What increases the risk? There is a higher number of women with MS than men. MS is not an illness that is passed down to you from your family members (inherited). However, your risk of MS is higher if you have a relative with MS. What are the signs or symptoms? The symptoms of MS occur in episodes or attacks. These attacks may last weeks to months. There may be long periods of almost no symptoms between attacks. The symptoms of MS vary. This is because of the many different ways it affects the central nervous system. The main symptoms of MS include:  Vision problems and eye pain.  Numbness.  Weakness.  Inability to move your arms, hands, feet, or legs (paralysis).  Balance problems.  Tremors.  How is this diagnosed? Your health care provider can diagnose MS with the help of imaging exams and lab tests. These may include specialized X-ray exams and spinal fluid tests. The best imaging exam to confirm a diagnosis of MS is an MRI. How is this treated? There is no known cure for MS, but there are medicines that can decrease the number and frequency of attacks. Steroids are often used for short-term relief. Physical and occupational therapy may also help. There are also many new alternative or complementary treatments available  to help control the symptoms of MS. Ask your health care provider if any of these other options are right for you. Follow these instructions at home:  Take medicines as directed by your health care provider.  Exercise as directed by your health care provider. Contact a health care provider if: You begin to feel depressed. Get help right away if:  You develop paralysis.  You have problems with bladder, bowel, or sexual function.  You develop mental changes, such as forgetfulness or mood swings.  You have a period of uncontrolled movements (seizure). This information is not intended to replace advice given to you by your health care provider. Make sure you discuss any questions you have with your health care provider. Document Released: 01/03/2000 Document Revised: 06/13/2015 Document Reviewed: 09/12/2012 Elsevier Interactive Patient Education  2017 [employer].

## 2017-08-05 MED FILL — INVOKAMET 50-1,000 MG TAB: 50-1000 | 30 days supply | Qty: 60 | Fill #4

## 2017-08-13 ENCOUNTER — Encounter: Payer: Self-pay | Admitting: Physical Therapy

## 2017-08-13 ENCOUNTER — Ambulatory Visit: Payer: BLUE CROSS/BLUE SHIELD | Admitting: Physical Therapy

## 2017-08-13 DIAGNOSIS — M5412 Radiculopathy, cervical region: Secondary | ICD-10-CM

## 2017-08-13 DIAGNOSIS — M25511 Pain in right shoulder: Secondary | ICD-10-CM | POA: Diagnosis not present

## 2017-08-13 DIAGNOSIS — R29898 Other symptoms and signs involving the musculoskeletal system: Secondary | ICD-10-CM | POA: Diagnosis not present

## 2017-08-13 DIAGNOSIS — G8929 Other chronic pain: Secondary | ICD-10-CM | POA: Diagnosis not present

## 2017-08-13 NOTE — Therapy (Signed)
[place], [place], [address] Phone: [phone]   Fax:  [phone]  Physical Therapy Evaluation  Patient Details  Name: [name] MRN: [id number] Date of Birth: November 19, 1970 Referring Provider: Georgina Snell   Encounter Date: 08/13/2017  PT End of Session - 08/13/17 1616    Visit Number  1    Number of Visits  12    Date for PT Re-Evaluation  09/24/17    PT Start Time  1410    PT Stop Time  1506    PT Time Calculation (min)  56 min    Activity Tolerance  Patient tolerated treatment well    Behavior During Therapy  [facility] for tasks assessed/performed       Past Medical History:  Diagnosis Date  . Diabetes mellitus without complication ([place])   . Hypertension   . Multiple sclerosis (HCC) 1999   Dr Trula Ore  . Nephrolithiasis    sees urology    Past Surgical History:  Procedure Laterality Date  . APPENDECTOMY  2003  . LITHOTRIPSY      There were no vitals filed for this visit.   Subjective Assessment - 08/13/17 1547    Subjective  Pt relays he has signs of MS dating back to 1999. He has had Rt sided neck and shoulder pain and tightness since January 2019 after sleeping wrong. Then 2 weeks ago he developed Rt leg weakness that started after he became overheated while cooking at a cookout. He now has pain with turning his head to the left and bending to the Rt, with raising his arm up overhead, and Rt leg weakness with SLS and descending stairs    Pertinent History  MS?, DM    Diagnostic tests  MRI shows signs of MS    Currently in Pain?  Yes    Pain Score  5     Pain Location  Shoulder    Pain Orientation  Right    Pain Descriptors / Indicators  Heaviness;Jabbing;Tightness    Pain Type  Chronic pain    Pain Onset  More than a month ago    Pain Frequency  Intermittent    Aggravating Factors   raising arm OH, neck side bend or rotation    Pain Relieving Factors  NSAIDS         [facility] PT  Assessment - 08/13/17 0001      Assessment   Medical Diagnosis  Rt neck/shoulder pain,     Referring Provider  Georgina Snell    Prior Therapy  none      Precautions   Precautions  None      Balance Screen   Has the patient fallen in the past 6 months  No      Prior Function   Level of Independence  Independent    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    Leisure  throw ball with kid, walk dogs      Cognition   Overall Cognitive Status  Within Functional Limits for tasks assessed      Observation/Other Assessments   Focus on Therapeutic Outcomes (FOTO)   35% limited      Sensation   Light Touch  Appears Intact      Posture/Postural Control   Posture Comments  rounded shoulders      ROM / Strength   AROM / PROM / Strength  AROM;Strength      AROM  Overall AROM   -- neck WNL except SB 75% and rotation to Lt 75%, shldr WNL ROM      Strength   Overall Strength  Deficits    Strength Assessment Site  Shoulder;Hip;Knee    Right/Left Shoulder  Right    Right Shoulder Flexion  5/5    Right Shoulder Extension  5/5    Right Shoulder ABduction  5/5    Right Shoulder Internal Rotation  5/5    Right Shoulder External Rotation  4/5    Right/Left Hip  Right    Right Hip Flexion  4/5    Right Hip Extension  4/5    Right Hip External Rotation   5/5    Right Hip Internal Rotation  5/5    Right Hip ABduction  4/5    Right/Left Knee  Right    Right Knee Flexion  5/5    Right Knee Extension  5/5      Flexibility   Soft Tissue Assessment /Muscle Length  -- tighntess in UT, and cervical P.S Rt      Palpation   Palpation comment  TTP over superior/posterior shoulder and UT      Special Tests    Special Tests  -- neg spurlings test, positive impingment signs                Objective measurements completed on examination: See above findings.      [facility] Adult PT Treatment/Exercise - 08/13/17 0001      Modalities   Modalities   Cryotherapy;Electrical Stimulation;Iontophoresis      Cryotherapy   Number Minutes Cryotherapy  15 Minutes with TENS    Cryotherapy Location  Shoulder    Type of Cryotherapy  Ice pack      Electrical Stimulation   Electrical Stimulation Location  Rt shldr    Electrical Stimulation Action  TENS    Electrical Stimulation Parameters  tolerance    Electrical Stimulation Goals  Pain      Iontophoresis   Type of Iontophoresis  Dexamethasone    Location  Rt shlder    Dose  80 Mamp    Time  take home patch             PT Education - 08/13/17 1615    Education Details  HEP, POC, TENS, Ionto    Person(s) Educated  Patient    Methods  Explanation;Demonstration;Handout    Comprehension  Verbalized understanding;Need further instruction          PT Long Term Goals - 08/13/17 1621      PT LONG TERM GOAL #1   Title  Pt will be I and compliant with HEP.     Status  New      PT LONG TERM GOAL #2   Title  Pt will improve FOTO score to less than 25% limited. 6 weeks 09/24/17    Status  New      PT LONG TERM GOAL #3   Title  Pt will improve Rt shoulder and Rt LE strength to 5/5 MMT. 6 weeks 09/24/17      PT LONG TERM GOAL #4   Title  Pt will improve cervical ROM to WNL. 6 weeks 09/24/17      PT LONG TERM GOAL #5   Title  Pt will report less than 2/10 overall pain with all activities and ADL's. 6 weeks 09/24/17    Status  New  Plan - 08/13/17 1616    Clinical Impression Statement  Pt presents with Rt neck/shoulder pain with intermittent radiculopathy but also signs of bursitis/impingment. He also has Rt sided leg weakness that started 2 weeks ago. Pt will benefit from PT to address these deficits, to reduce pain, and improve overall function.    Clinical Presentation  Stable    Clinical Decision Making  Moderate    Rehab Potential  Good    PT Frequency  2x / week    PT Duration  6 weeks    PT Treatment/Interventions  ADLs/Self Care Home  Management;Cryotherapy;Electrical Stimulation;Iontophoresis 4mg /ml Dexamethasone;Moist Heat;Traction;Ultrasound;Therapeutic activities;Therapeutic exercise;Neuromuscular re-education;Patient/family education;Manual techniques;Passive range of motion;Dry needling    PT Next Visit Plan  review HEP, assess response to modalties       Patient will benefit from skilled therapeutic intervention in order to improve the following deficits and impairments:  Decreased activity tolerance, Decreased range of motion, Decreased strength, Increased muscle spasms, Postural dysfunction, Pain  Visit Diagnosis: Chronic right shoulder pain  Cervical radiculopathy at C7  Right leg weakness     Problem List Patient Active Problem List   Diagnosis Date Noted  . Diabetes mellitus, type 2 ([place]) 04/16/2016  . Hypertriglyceridemia 02/12/2014  . Calculus of kidney 04/03/2013  . DS (disseminated sclerosis) ([place]) 04/03/2013  . Other and unspecified hyperlipidemia 01/02/2013  . LADA (latent autoimmune diabetes in adults), managed as type 1 ([place]) 11/11/2012    Debbe Odea, PT, DPT 08/13/2017, 4:29 PM  [facility] [place], [place], [address] Phone: [phone]   Fax:  [phone]  Name: Neldon Shepard MRN: [id number] Date of Birth: 06-02-1970

## 2017-08-13 NOTE — Patient Instructions (Addendum)
Access Code: [email or web address]  URL: [email or web address]  Date: 08/13/2017  Prepared by: Elsie Ra   Exercises  Doorway Pec Stretch at 90 Degrees Abduction - 10 reps - 3 sets - 2x daily - 6x weekly  Standing Shoulder Flexion Wall Slide - 10 reps - 3 sets - 2x daily - 6x weekly  Standing Shoulder Abduction Slides at Wall - 10 reps - 3 sets - 2x daily - 6x weekly  Standing Shoulder Row with Anchored Resistance - 10 reps - 3 sets - 2x daily - 6x weekly  Shoulder External Rotation with Anchored Resistance - 10 reps - 3 sets - 2x daily - 6x weekly  Seated Cervical Sidebending Stretch - 3 sets - 30 sec hold - 2x daily - 6x weekly  Lunge with Counter Support - 10 reps - 3 sets - 5 hold - 1x daily - 7x weekly  Sit to Stand - 10 reps - 3 sets - 2x daily - 6x weekly  Supine Active Straight Leg Raise - 10 reps - 3 sets - 2x daily - 6x weekly  Leg Swing Single Leg Balance - 3 reps - 1 sets - 30 sec hold - 2x daily - 6x weekly  Sit to Stand without Arm Support - 10 reps - 3 sets - 2x daily - 6x weekly  Pt provided illustrated copy TENS UNIT: This is helpful for muscle pain and spasm.   Search and Purchase a TENS 7000 2nd edition at www.tenspros.com. It should be less than $30.     TENS unit instructions: Do not shower or bathe with the unit on Turn the unit off before removing electrodes or batteries If the electrodes lose stickiness add a drop of water to the electrodes after they are disconnected from the unit and place on plastic sheet. If you continued to have difficulty, call the TENS unit company to purchase more electrodes. Do not apply lotion on the skin area prior to use. Make sure the skin is clean and dry as this will help prolong the life of the electrodes. After use, always check skin for unusual red areas, rash or other skin difficulties. If there are any skin problems, does not apply electrodes to the same area. Never remove the electrodes from the unit by pulling the  wires. Do not use the TENS unit or electrodes other than as directed. Do not change electrode placement without consultating your therapist or physician. Keep 2 fingers with between each electrode. Wear time ratio is 2:1, on to off times.    For example on for 30 minutes off for 15 minutes and then on for 30 minutes off for 15 minutes

## 2017-08-18 ENCOUNTER — Ambulatory Visit (INDEPENDENT_AMBULATORY_CARE_PROVIDER_SITE_OTHER): Payer: BLUE CROSS/BLUE SHIELD | Admitting: Physical Therapy

## 2017-08-18 DIAGNOSIS — M5412 Radiculopathy, cervical region: Secondary | ICD-10-CM

## 2017-08-18 DIAGNOSIS — M25511 Pain in right shoulder: Secondary | ICD-10-CM | POA: Diagnosis not present

## 2017-08-18 DIAGNOSIS — G8929 Other chronic pain: Secondary | ICD-10-CM

## 2017-08-18 DIAGNOSIS — R29898 Other symptoms and signs involving the musculoskeletal system: Secondary | ICD-10-CM | POA: Diagnosis not present

## 2017-08-18 NOTE — Therapy (Signed)
[place], [place], [address] Phone: [phone]   Fax:  [phone]  Physical Therapy Treatment  Patient Details  Name: [name] MRN: [id number] Date of Birth: February 26, 1970 Referring Provider: Dr. Georgina Snell   Encounter Date: 08/18/2017  PT End of Session - 08/18/17 1707    Visit Number  2    Number of Visits  12    Date for PT Re-Evaluation  09/24/17    PT Start Time  1606 pt arrived late    PT Stop Time  1645    PT Time Calculation (min)  39 min    Activity Tolerance  Patient tolerated treatment well    Behavior During Therapy  [facility] for tasks assessed/performed       Past Medical History:  Diagnosis Date  . Diabetes mellitus without complication ([place])   . Hypertension   . Multiple sclerosis (HCC) 1999   Dr Trula Ore  . Nephrolithiasis    sees urology    Past Surgical History:  Procedure Laterality Date  . APPENDECTOMY  2003  . LITHOTRIPSY      There were no vitals filed for this visit.  Subjective Assessment - 08/18/17 1611    Subjective  Pt reports he doesn't feel much change since last visit.  He has been doing the exercises and has some questions about them.  (brought HEP papers with).     Currently in Pain?  No/denies    Pain Score  0-No pain "Just heavy feeling".  Pain up to 7/10 with reaching over head.          [facility] PT Assessment - 08/18/17 0001      Assessment   Medical Diagnosis  Rt neck/shoulder pain,  RLE weakness    Referring Provider  Dr. Madelaine Bhat Dominance  Right       [facility] Adult PT Treatment/Exercise - 08/18/17 0001      Exercises   Exercises  Shoulder;Knee/Hip      Knee/Hip Exercises: Standing   SLS  SLS with toe taps front, side, back x 8 reps each leg, no UE support    Other Standing Knee Exercises  split squat with UE support x 10 reps each leg - multiple cues for form      Knee/Hip Exercises: Seated   Sit to Sand  1 set;10 reps;without UE  support challenging. trial of Rt foot back.       Knee/Hip Exercises: Supine   Other Supine Knee/Hip Exercises  verbally reviewed SLR      Shoulder Exercises: Standing   External Rotation  Strengthening;Right;Left;10 reps;Theraband    Theraband Level (Shoulder External Rotation)  Level 2 (Red);Level 3 (Green) 1 set with each band    Row  Both;10 reps;Theraband    Theraband Level (Shoulder Row)  Level 3 (Green)      Shoulder Exercises: Stretch   Table Stretch - Flexion  10 seconds 8 reps    Table Stretch - Abduction  5 reps;10 seconds    Other Shoulder Stretches  low and mid level doorway stretch x 30 sec x 3 reps each.       Modalities   Modalities  -- held; pt pain free at end      cervical side bend to Lt x 3 reps of 15 seconds.     PT Long Term Goals - 08/18/17 1707      PT LONG TERM GOAL #1   Title  Pt will be  I and compliant with HEP.     Status  On-going      PT LONG TERM GOAL #2   Title  Pt will improve FOTO score to less than 25% limited. 6 weeks 09/24/17    Status  On-going      PT LONG TERM GOAL #3   Title  Pt will improve Rt shoulder and Rt LE strength to 5/5 MMT. 6 weeks 09/24/17    Status  On-going      PT LONG TERM GOAL #4   Title  Pt will improve cervical ROM to WNL. 6 weeks 09/24/17    Status  On-going      PT LONG TERM GOAL #5   Title  Pt will report less than 2/10 overall pain with all activities and ADL's. 6 weeks 09/24/17    Status  On-going            Plan - 08/18/17 1708    Clinical Impression Statement  Pt required multiple cues for proper form of exercises.  Also required freq cues for posture throughout.  Pt tolerated all exercises well; modified shoulder flexion wall slide to table slide for improved tolerance. Pt progressing towards goals.     Rehab Potential  Good    PT Frequency  2x / week    PT Duration  6 weeks    PT Treatment/Interventions  ADLs/Self Care Home Management;Cryotherapy;Electrical Stimulation;Iontophoresis 4mg /ml  Dexamethasone;Moist Heat;Traction;Ultrasound;Therapeutic activities;Therapeutic exercise;Neuromuscular re-education;Patient/family education;Manual techniques;Passive range of motion;Dry needling    PT Next Visit Plan  postural strengthening and education.         Patient will benefit from skilled therapeutic intervention in order to improve the following deficits and impairments:  Decreased activity tolerance, Decreased range of motion, Decreased strength, Increased muscle spasms, Postural dysfunction, Pain  Visit Diagnosis: Chronic right shoulder pain  Cervical radiculopathy at C7  Right leg weakness     Problem List Patient Active Problem List   Diagnosis Date Noted  . Diabetes mellitus, type 2 ([place]) 04/16/2016  . Hypertriglyceridemia 02/12/2014  . Calculus of kidney 04/03/2013  . DS (disseminated sclerosis) ([place]) 04/03/2013  . Other and unspecified hyperlipidemia 01/02/2013  . LADA (latent autoimmune diabetes in adults), managed as type 1 ([place]) 11/11/2012   Kerin Perna, PTA 08/18/17 5:10 PM  [place], [place], [address] Phone: [phone]   Fax:  [phone]  Name: [name] MRN: [id number] Date of Birth: 1970-09-25

## 2017-08-18 NOTE — Patient Instructions (Signed)
Flexion (Passive)    Sitting upright, slide forearm forward along table, bending from the waist until a stretch is felt. Hold _10___ seconds. Repeat _5___ times. Do ___2_ sessions per day.

## 2017-08-20 ENCOUNTER — Ambulatory Visit (INDEPENDENT_AMBULATORY_CARE_PROVIDER_SITE_OTHER): Payer: BLUE CROSS/BLUE SHIELD | Admitting: Physical Therapy

## 2017-08-20 DIAGNOSIS — G8929 Other chronic pain: Secondary | ICD-10-CM | POA: Diagnosis not present

## 2017-08-20 DIAGNOSIS — M5412 Radiculopathy, cervical region: Secondary | ICD-10-CM | POA: Diagnosis not present

## 2017-08-20 DIAGNOSIS — R29898 Other symptoms and signs involving the musculoskeletal system: Secondary | ICD-10-CM | POA: Diagnosis not present

## 2017-08-20 DIAGNOSIS — M25511 Pain in right shoulder: Secondary | ICD-10-CM | POA: Diagnosis not present

## 2017-08-20 NOTE — Therapy (Signed)
[place], [place], [address] Phone: [phone]   Fax:  [phone]  Physical Therapy Treatment  Patient Details  Name: [name] MRN: [id number] Date of Birth: 12-19-1970 Referring Provider: Dr. Georgina Snell   Encounter Date: 08/20/2017  PT End of Session - 08/20/17 1624    Visit Number  3    Number of Visits  12    Date for PT Re-Evaluation  09/24/17    PT Start Time  1540    PT Stop Time  1625    PT Time Calculation (min)  45 min    Activity Tolerance  Patient tolerated treatment well    Behavior During Therapy  [facility] for tasks assessed/performed       Past Medical History:  Diagnosis Date  . Diabetes mellitus without complication ([place])   . Hypertension   . Multiple sclerosis (HCC) 1999   Dr Trula Ore  . Nephrolithiasis    sees urology    Past Surgical History:  Procedure Laterality Date  . APPENDECTOMY  2003  . LITHOTRIPSY      There were no vitals filed for this visit.  Subjective Assessment - 08/20/17 1550    Subjective  Pt relays his shoulder is feeling good, but his legs need some attention    Pertinent History  MS?, DM    Diagnostic tests  MRI shows signs of MS    Pain Score  0-No pain         [facility] PT Assessment - 08/20/17 0001      AROM   Overall AROM   -- neck WNL except Rt lat rotation 75%, shoulder WNL      Strength   Right/Left Shoulder  Right    Right Shoulder Flexion  5/5    Right Shoulder Extension  5/5    Right Shoulder ABduction  5/5    Right Shoulder Internal Rotation  5/5    Right Shoulder External Rotation  5/5    Right/Left Hip  Right    Right Hip Flexion  4+/5    Right Hip Extension  5/5    Right Hip ABduction  4+/5                   [facility] Adult PT Treatment/Exercise - 08/20/17 0001      Exercises   Exercises  Shoulder;Knee/Hip      Knee/Hip Exercises: Aerobic   Stationary Bike  L3 5 min      Knee/Hip Exercises: Machines for  Strengthening   Total Gym Leg Press  5 plates 2X15      Knee/Hip Exercises: Standing   Hip Flexion  Stengthening;Right;1 set;20 reps green    Hip ADduction  Strengthening;1 set;20 reps green    Hip Abduction  Stengthening;1 set;20 reps green    Hip Extension  Stengthening;1 set;20 reps green    Lateral Step Up  20 reps;Right;Hand Hold: 0;Step Height: 6"    Forward Step Up  Right;20 reps;Hand Hold: 0;Step Height: 6"      Knee/Hip Exercises: Seated   Sit to Sand  2 sets;10 reps eccentric lower      Shoulder Exercises: Standing   External Rotation  Strengthening;Right;Left;Theraband;20 reps    Theraband Level (Shoulder External Rotation)  Level 2 (Red)    Internal Rotation  Strengthening;20 reps    Theraband Level (Shoulder Internal Rotation)  Level 2 (Red)    Extension  20 reps    Theraband Level (Shoulder Extension)  Level 2 (  Red)    Row  20 reps    Theraband Level (Shoulder Row)  Level 2 (Red)      Shoulder Exercises: ROM/Strengthening   Wall Wash  flex/abd 15 reps      Shoulder Exercises: Stretch   Other Shoulder Stretches  mid 30 sec X2             PT Education - 08/20/17 1624    Education Details  HEP review and technique    Person(s) Educated  Patient    Methods  Explanation;Demonstration    Comprehension  Verbalized understanding          PT Long Term Goals - 08/18/17 1707      PT LONG TERM GOAL #1   Title  Pt will be I and compliant with HEP.     Status  On-going      PT LONG TERM GOAL #2   Title  Pt will improve FOTO score to less than 25% limited. 6 weeks 09/24/17    Status  On-going      PT LONG TERM GOAL #3   Title  Pt will improve Rt shoulder and Rt LE strength to 5/5 MMT. 6 weeks 09/24/17    Status  On-going      PT LONG TERM GOAL #4   Title  Pt will improve cervical ROM to WNL. 6 weeks 09/24/17    Status  On-going      PT LONG TERM GOAL #5   Title  Pt will report less than 2/10 overall pain with all activities and ADL's. 6 weeks 09/24/17     Status  On-going            Plan - 08/20/17 1626    Clinical Impression Statement  Pt showed better understanding and demonstration with exercises today. He has made excellent progress thus far with ROM, Strength, and had no pain today. See updated measurements.     Rehab Potential  Good    PT Frequency  2x / week    PT Duration  6 weeks    PT Treatment/Interventions  ADLs/Self Care Home Management;Cryotherapy;Electrical Stimulation;Iontophoresis 4mg /ml Dexamethasone;Moist Heat;Traction;Ultrasound;Therapeutic activities;Therapeutic exercise;Neuromuscular re-education;Patient/family education;Manual techniques;Passive range of motion;Dry needling    PT Next Visit Plan  postural and LE strength       Patient will benefit from skilled therapeutic intervention in order to improve the following deficits and impairments:  Decreased activity tolerance, Decreased range of motion, Decreased strength, Increased muscle spasms, Postural dysfunction, Pain  Visit Diagnosis: Chronic right shoulder pain  Cervical radiculopathy at C7  Right leg weakness     Problem List Patient Active Problem List   Diagnosis Date Noted  . Diabetes mellitus, type 2 ([place]) 04/16/2016  . Hypertriglyceridemia 02/12/2014  . Calculus of kidney 04/03/2013  . DS (disseminated sclerosis) ([place]) 04/03/2013  . Other and unspecified hyperlipidemia 01/02/2013  . LADA (latent autoimmune diabetes in adults), managed as type 1 ([place]) 11/11/2012    Debbe Odea, PT, DPT 08/20/2017, 4:28 PM  [facility] [place], [place], [address] Phone: [phone]   Fax:  [phone]  Name: [name] MRN: [id number] Date of Birth: [birth year]

## 2017-08-23 ENCOUNTER — Ambulatory Visit (INDEPENDENT_AMBULATORY_CARE_PROVIDER_SITE_OTHER): Payer: BLUE CROSS/BLUE SHIELD | Admitting: Rehabilitative and Restorative Service Providers"

## 2017-08-23 ENCOUNTER — Encounter: Payer: Self-pay | Admitting: Rehabilitative and Restorative Service Providers"

## 2017-08-23 DIAGNOSIS — G8929 Other chronic pain: Secondary | ICD-10-CM

## 2017-08-23 DIAGNOSIS — M5412 Radiculopathy, cervical region: Secondary | ICD-10-CM

## 2017-08-23 DIAGNOSIS — M25511 Pain in right shoulder: Secondary | ICD-10-CM | POA: Diagnosis not present

## 2017-08-23 DIAGNOSIS — R29898 Other symptoms and signs involving the musculoskeletal system: Secondary | ICD-10-CM

## 2017-08-23 NOTE — Patient Instructions (Addendum)
Self massage using ~ 4 inch plastic ball   Axial Extension (Chin Tuck)    Pull chin in and lengthen back of neck. Hold __5__ seconds while counting out loud. Repeat __10__ times. Do __several__ sessions per day.   Side Bend, Sitting    Sit, head in comfortable, centered position, chin slightly tucked. Gently tilt head, bringing ear toward same-side shoulder. Hold __10_ seconds.  Repeat _3__ times per session. Do _2-3_ sessions per day.   Shoulder Blade Squeeze   Can use noodle for tactile cue  Rotate shoulders back, then squeeze shoulder blades down and back. Hold 10 sec Repeat __10__ times. Do __several __ sessions per day.  Marland KitchenResisted External Rotation: in Neutral - Bilateral   PALMS UP Sit or stand, tubing in both hands, elbows at sides, bent to 90, forearms forward. Pinch shoulder blades together and rotate forearms out. Keep elbows at sides. Repeat __10__ times per set. Do _2-3___ sets per session. Do _2-3___ sessions per day.  Hang theraband on bathroom or fridge door     Upper Back Strength: Lower Trapezius / Rotator Cuff " L's "     Arms in waitress pose, palms up. Press hands back and slide shoulder blades down. Hold for __5__ seconds. Repeat _10___ times. 1-2 times per day.    Scapular Retraction: Elbow Flexion (Standing)  "W's"     With elbows bent to 90, pinch shoulder blades together and rotate arms out, keeping elbows bent. Repeat __10__ times per set. Do __1-2__ sets per session. Do _several ___ sessions per day.   [facility] [address] [place], [place] [address]  [phone] (office) [phone] (fax)    IONTOPHORESIS PATIENT PRECAUTIONS & CONTRAINDICATIONS:  . Redness under one or both electrodes can occur.  This characterized by a uniform redness that usually disappears within 12 hours of treatment. . Small pinhead size blisters may result in response to the drug.  Contact your  physician if the problem persists more than 24 hours. . On rare occasions, iontophoresis therapy can result in temporary skin reactions such as rash, inflammation, irritation or burns.  The skin reactions may be the result of individual sensitivity to the ionic solution used, the condition of the skin at the start of treatment, reaction to the materials in the electrodes, allergies or sensitivity to dexamethasone, or a poor connection between the patch and your skin.  Discontinue using iontophoresis if you have any of these reactions and report to your therapist. . Remove the Patch or electrodes if you have any undue sensation of pain or burning during the treatment and report discomfort to your therapist. . Tell your Therapist if you have had known adverse reactions to the application of electrical current. . If using the Patch, the LED light will turn off when treatment is complete and the patch can be removed.  Approximate treatment time is 1-3 hours.  Remove the patch when light goes off or after 6 hours. . The Patch can be worn during normal activity, however excessive motion where the electrodes have been placed can cause poor contact between the skin and the electrode or uneven electrical current resulting in greater risk of skin irritation. Marland Kitchen Keep out of the reach of children.   . DO NOT use if you have a cardiac pacemaker or any other electrically sensitive implanted device. . DO NOT use if you have a known sensitivity to dexamethasone. . DO NOT use during Magnetic Resonance Imaging (MRI). Marland Kitchen  DO NOT use over broken or compromised skin (e.g. sunburn, cuts, or acne) due to the increased risk of skin reaction. . DO NOT SHAVE over the area to be treated:  To establish good contact between the Patch and the skin, excessive hair may be clipped. . DO NOT place the Patch or electrodes on or over your eyes, directly over your heart, or brain. . DO NOT reuse the Patch or electrodes as this may  cause burns to occur.

## 2017-08-23 NOTE — Therapy (Signed)
[place], [place], [address] Phone: [phone]   Fax:  [phone]  Physical Therapy Treatment  Patient Details  Name: [name] MRN: [id number] Date of Birth: Apr 13, 1970 Referring Provider: Dr Georgina Snell    Encounter Date: 08/23/2017  PT End of Session - 08/23/17 1601    Visit Number  4    Number of Visits  12    Date for PT Re-Evaluation  09/24/17    PT Start Time  1601    PT Stop Time  1648    PT Time Calculation (min)  47 min    Activity Tolerance  Patient tolerated treatment well       Past Medical History:  Diagnosis Date  . Diabetes mellitus without complication ([place])   . Hypertension   . Multiple sclerosis (HCC) 1999   Dr Trula Ore  . Nephrolithiasis    sees urology    Past Surgical History:  Procedure Laterality Date  . APPENDECTOMY  2003  . LITHOTRIPSY      There were no vitals filed for this visit.  Subjective Assessment - 08/23/17 1601    Subjective  Patient reports that he has no pain. He is improving with shoulder pain decreased and LE strength improved. He has used the TENS unit some on the top of his shoulder with good results.     Currently in Pain?  No/denies         [facility] PT Assessment - 08/23/17 0001      Assessment   Medical Diagnosis  Rt neck/shoulder pain,  RLE weakness    Referring Provider  Dr Georgina Snell     Hand Dominance  Right      Posture/Postural Control   Posture Comments  head forward; shoulders rounded and elevated Rt > Lt; scapulae abducted and rotated along the thoracic wall; head of the humeurs anterior in orientation       AROM   Overall AROM Comments  painful arc Rt shoulder scaption ~ 80-100 deg                    [facility] Adult PT Treatment/Exercise - 08/23/17 0001      Knee/Hip Exercises: Aerobic   Nustep  L5 UE at 11 x 6 min       Shoulder Exercises: Supine   Other Supine Exercises  snow angel ~ 2 min UE's at 90 deg;  trunk rotation in snow angel position 15-20 sec hold 1 2 reps       Shoulder Exercises: Standing   Retraction  Strengthening;Right;Left;20 reps;Theraband with noodle along spine     Theraband Level (Shoulder Retraction)  Level 2 (Red)    Other Standing Exercises  axial extension with noodle 10 sec x 5 reps; L's x 10; W's x 10       Shoulder Exercises: Stretch   Other Shoulder Stretches  3 way doorway stretch 30 sec x 2 reps  correcting position       Iontophoresis   Type of Iontophoresis  Dexamethasone    Location  Rt shlder    Dose  120 mAMp    Time  12 hour       Manual Therapy   Manual therapy comments  pt supine     Soft tissue mobilization  deep tissue work thorugh the [employer] pecs/anterior chest and shoulder     Myofascial Release  anterior chest     Scapular Mobilization  Rt scapular passive movement  PT Education - 08/23/17 1633    Education Details  HEP ionto     Person(s) Educated  Patient    Methods  Explanation;Demonstration;Verbal cues;Tactile cues;Handout    Comprehension  Verbalized understanding;Returned demonstration;Verbal cues required;Tactile cues required          PT Long Term Goals - 08/23/17 1607      PT LONG TERM GOAL #1   Title  Pt will be I and compliant with HEP.     Status  On-going      PT LONG TERM GOAL #2   Title  Pt will improve FOTO score to less than 25% limited. 6 weeks 09/24/17    Status  On-going      PT LONG TERM GOAL #3   Title  Pt will improve Rt shoulder and Rt LE strength to 5/5 MMT. 6 weeks 09/24/17    Status  On-going      PT LONG TERM GOAL #4   Title  Pt will improve cervical ROM to WNL. 6 weeks 09/24/17    Status  On-going      PT LONG TERM GOAL #5   Title  Pt will report less than 2/10 overall pain with all activities and ADL's. 6 weeks 09/24/17    Status  On-going            Plan - 08/23/17 1605    Clinical Impression Statement  Patient reports ocntinued improvement. He has some grabbing or burning  in the Rt shoulder when reaching up but symptoms are improved. He no longer feels weakness in the Rt leg like he but he does have some continued tingling in the Rt LE on an intermittent basis. Patient has poor posture and alignment; significant muscular tightness through the anterior Rt shoulder; pain with palpation in anterior shoulder. Progressing well toward stated goals of therapy.     Rehab Potential  Good    PT Frequency  2x / week    PT Duration  6 weeks    PT Treatment/Interventions  ADLs/Self Care Home Management;Cryotherapy;Electrical Stimulation;Iontophoresis 4mg /ml Dexamethasone;Moist Heat;Traction;Ultrasound;Therapeutic activities;Therapeutic exercise;Neuromuscular re-education;Patient/family education;Manual techniques;Passive range of motion;Dry needling    PT Next Visit Plan  postural correction and LE strength as needed - focus on shoulder - assess response to trial of ionto and manual work anterior shoulder     Consulted and Agree with Plan of Care  Patient       Patient will benefit from skilled therapeutic intervention in order to improve the following deficits and impairments:  Decreased activity tolerance, Decreased range of motion, Decreased strength, Increased muscle spasms, Postural dysfunction, Pain  Visit Diagnosis: Chronic right shoulder pain  Cervical radiculopathy at C7  Right leg weakness     Problem List Patient Active Problem List   Diagnosis Date Noted  . Diabetes mellitus, type 2 ([place]) 04/16/2016  . Hypertriglyceridemia 02/12/2014  . Calculus of kidney 04/03/2013  . DS (disseminated sclerosis) ([place]) 04/03/2013  . Other and unspecified hyperlipidemia 01/02/2013  . LADA (latent autoimmune diabetes in adults), managed as type 1 ([place]) 11/11/2012    Meilin Brosh Nilda Simmer PT, MPH  08/23/2017, 5:01 PM  [facility] [place], [place], [address] Phone: [phone]   Fax:  [phone]  Name:  [name] MRN: [id number] Date of Birth: [birth year]

## 2017-08-25 ENCOUNTER — Ambulatory Visit (INDEPENDENT_AMBULATORY_CARE_PROVIDER_SITE_OTHER): Payer: BLUE CROSS/BLUE SHIELD | Admitting: Physical Therapy

## 2017-08-25 DIAGNOSIS — R29898 Other symptoms and signs involving the musculoskeletal system: Secondary | ICD-10-CM | POA: Diagnosis not present

## 2017-08-25 DIAGNOSIS — M25511 Pain in right shoulder: Secondary | ICD-10-CM

## 2017-08-25 DIAGNOSIS — G8929 Other chronic pain: Secondary | ICD-10-CM

## 2017-08-25 DIAGNOSIS — M5412 Radiculopathy, cervical region: Secondary | ICD-10-CM

## 2017-08-25 NOTE — Therapy (Signed)
[place], [place], [address] Phone: [phone]   Fax:  [phone]  Physical Therapy Treatment  Patient Details  Name: [name] MRN: [id number] Date of Birth: October 22, 1970 Referring Provider: Dr Georgina Snell    Encounter Date: 08/25/2017  PT End of Session - 08/25/17 1645    Visit Number  5    Number of Visits  12    Date for PT Re-Evaluation  09/24/17    PT Start Time  1601    PT Stop Time  2952    PT Time Calculation (min)  42 min    Activity Tolerance  Patient tolerated treatment well;No increased pain       Past Medical History:  Diagnosis Date  . Diabetes mellitus without complication ([place])   . Hypertension   . Multiple sclerosis (HCC) 1999   Dr Trula Ore  . Nephrolithiasis    sees urology    Past Surgical History:  Procedure Laterality Date  . APPENDECTOMY  2003  . LITHOTRIPSY      There were no vitals filed for this visit.  Subjective Assessment - 08/25/17 1608    Subjective  Pt relays he is improving with pain but he does still have a grabbing sensation in his shoulder when he goes to reach    Pain Score  -- denies pain unless he reaches    Pain Location  Shoulder                       [facility] Adult PT Treatment/Exercise - 08/25/17 0001      Exercises   Exercises  Shoulder;Knee/Hip      Knee/Hip Exercises: Standing   Step Down  2 sets;10 reps;Hand Hold: 0 6 inch step      Shoulder Exercises: Standing   External Rotation  -- 2X15 reps    Theraband Level (Shoulder External Rotation)  Level 3 (Green)    Internal Rotation  Right 2X15    Theraband Level (Shoulder Internal Rotation)  Level 3 (Green)    Extension  Right 2X15    Theraband Level (Shoulder Extension)  Level 3 (Green)    Row  -- 2X15    Theraband Level (Shoulder Row)  Level 3 (Green)    Retraction  Strengthening;Right;Left;20 reps;Theraband    Theraband Level (Shoulder Retraction)  Level 3  (Green)      Shoulder Exercises: Stretch   Other Shoulder Stretches  3 way doorway stretch 30 sec x 2 reps       Manual Therapy   Manual therapy comments  pt supine     Soft tissue mobilization  deep tissue work thorugh the Rt pecs/anterior chest and shoulder, GH mobs posterior and inf glides, pin and stretch to chest/ant shoulder    Myofascial Release  anterior chest              PT Education - 08/25/17 1644    Education Details  posture and chest stetching to decrease anterior rolling/position on glenoid          PT Long Term Goals - 08/23/17 1607      PT LONG TERM GOAL #1   Title  Pt will be I and compliant with HEP.     Status  On-going      PT LONG TERM GOAL #2   Title  Pt will improve FOTO score to less than 25% limited. 6 weeks 09/24/17    Status  On-going  PT LONG TERM GOAL #3   Title  Pt will improve Rt shoulder and Rt LE strength to 5/5 MMT. 6 weeks 09/24/17    Status  On-going      PT LONG TERM GOAL #4   Title  Pt will improve cervical ROM to WNL. 6 weeks 09/24/17    Status  On-going      PT LONG TERM GOAL #5   Title  Pt will report less than 2/10 overall pain with all activities and ADL's. 6 weeks 09/24/17    Status  On-going            Plan - 08/25/17 1645    Clinical Impression Statement  Pt is making great progress thus far. He had painful arc upon arrival but after session and Manual therapy he did not have painful arc. Pt declined modalities today as he had no pain upon leaving. Pt will continue to progress as able.    Rehab Potential  Good    PT Frequency  2x / week    PT Duration  6 weeks    PT Treatment/Interventions  ADLs/Self Care Home Management;Cryotherapy;Electrical Stimulation;Iontophoresis 4mg /ml Dexamethasone;Moist Heat;Traction;Ultrasound;Therapeutic activities;Therapeutic exercise;Neuromuscular re-education;Patient/family education;Manual techniques;Passive range of motion;Dry needling    PT Next Visit Plan  postural correction  and LE strength as needed - focus on shoulder with strengthening, stretching, and MT     Consulted and Agree with Plan of Care  Patient       Patient will benefit from skilled therapeutic intervention in order to improve the following deficits and impairments:  Decreased activity tolerance, Decreased range of motion, Decreased strength, Increased muscle spasms, Postural dysfunction, Pain  Visit Diagnosis: Chronic right shoulder pain  Cervical radiculopathy at C7  Right leg weakness     Problem List Patient Active Problem List   Diagnosis Date Noted  . Diabetes mellitus, type 2 ([place]) 04/16/2016  . Hypertriglyceridemia 02/12/2014  . Calculus of kidney 04/03/2013  . DS (disseminated sclerosis) ([place]) 04/03/2013  . Other and unspecified hyperlipidemia 01/02/2013  . LADA (latent autoimmune diabetes in adults), managed as type 1 ([place]) 11/11/2012    Debbe Odea, PT, DPT 08/25/2017, 4:49 PM  [facility] [place], [place], [address] Phone: [phone]   Fax:  [phone]  Name: [name] MRN: [id number] Date of Birth: 1970/10/09

## 2017-08-30 ENCOUNTER — Encounter: Payer: BLUE CROSS/BLUE SHIELD | Admitting: Physical Therapy

## 2017-09-01 ENCOUNTER — Ambulatory Visit (INDEPENDENT_AMBULATORY_CARE_PROVIDER_SITE_OTHER): Payer: BLUE CROSS/BLUE SHIELD | Admitting: Physical Therapy

## 2017-09-01 DIAGNOSIS — M25511 Pain in right shoulder: Secondary | ICD-10-CM

## 2017-09-01 DIAGNOSIS — R29898 Other symptoms and signs involving the musculoskeletal system: Secondary | ICD-10-CM | POA: Diagnosis not present

## 2017-09-01 DIAGNOSIS — G8929 Other chronic pain: Secondary | ICD-10-CM

## 2017-09-01 DIAGNOSIS — M5412 Radiculopathy, cervical region: Secondary | ICD-10-CM | POA: Diagnosis not present

## 2017-09-01 NOTE — Therapy (Addendum)
[place], [place], [address] Phone: [phone]   Fax:  [phone]  Physical Therapy Treatment/Discharge  Patient Details  Name: [name] MRN: [id number] Date of Birth: [birth year] Referring Provider: Dr. Georgina Snell    Encounter Date: 09/01/2017  PT End of Session - 09/01/17 1611    Visit Number  6    Number of Visits  12    Date for PT Re-Evaluation  09/24/17    PT Start Time  1605   pt arrived late   PT Stop Time  1645    PT Time Calculation (min)  40 min    Activity Tolerance  Patient tolerated treatment well    Behavior During Therapy  [facility] for tasks assessed/performed       Past Medical History:  Diagnosis Date  . Diabetes mellitus without complication ([place])   . Hypertension   . Multiple sclerosis (HCC) 1999   Dr Trula Ore  . Nephrolithiasis    sees urology    Past Surgical History:  Procedure Laterality Date  . APPENDECTOMY  2003  . LITHOTRIPSY      There were no vitals filed for this visit.  Subjective Assessment - 09/01/17 1608    Subjective  [name] reports he no longer has any lack of strength in RLE.  He complains of stiffness in his Rt shoulder, still catches with certain motions.  "Definitely improving; reaching and ROM, very positive".   He reports he is doing his exercises in evening after work.    Pt reporting 80% improvement since starting therapy.     Diagnostic tests  MRI shows signs of MS    Currently in Pain?  No/denies    Pain Score  0-No pain         [facility] PT Assessment - 09/01/17 0001      Assessment   Medical Diagnosis  Rt neck/shoulder pain,  RLE weakness    Referring Provider  Dr. Georgina Snell     Hand Dominance  Right    Next MD Visit  PRN      AROM   Overall AROM   --   neck rotation 80 deg bilat, no pain   Overall AROM Comments  painfree abduction and flexion with Rt shoulder      Strength   Right Hip Flexion  --   5-/5   Right Hip ABduction  --    5-/5         [facility] Adult PT Treatment/Exercise - 09/01/17 0001      Lumbar Exercises: Sidelying   Hip Abduction  Right;10 reps;2 seconds   leading with heel     Knee/Hip Exercises: Aerobic   Nustep  L6: arms/ legs x 5 min    PTA present to discuss progress     Knee/Hip Exercises: Standing   SLS  SLS Rt/Lt with eyes closed x 5 sec, eyes open with horiz head turns x 30 sec     Other Standing Knee Exercises  tandem stance on bilat therapad with horiz head turns x 30 sec each leg; tandem stance on 1/2 foam roller x 30 sec with occasional UE to steady (challenging);  feet parallel on 1/2 foam roller -trying to keep feet pallel to floor.       Knee/Hip Exercises: Supine   Other Supine Knee/Hip Exercises  Rt resisted hip flexion x 5 sec x 10 reps; Rt SLR x 10      Shoulder Exercises: Standing   External Rotation  Both;10 reps;Theraband   2 sets   Theraband Level (Shoulder External Rotation)  Level 2 (Red);Level 3 (Green)    Extension  Both;10 reps;Theraband   standing on therapad bilat   Theraband Level (Shoulder Extension)  Level 3 (Green)    Row  Both;10 reps;Theraband   standing on therapad bilat    Theraband Level (Shoulder Row)  Level 3 (Green)      Shoulder Exercises: Stretch   Other Shoulder Stretches  3 way doorway stretch 30 sec x 2 reps       Modalities   Modalities  --   pt declined; pain free.            PT Long Term Goals - 09/01/17 1612      PT LONG TERM GOAL #1   Title  Pt will be I and compliant with HEP.     Status  On-going      PT LONG TERM GOAL #2   Title  Pt will improve FOTO score to less than 25% limited. 6 weeks 09/24/17    Status  On-going      PT LONG TERM GOAL #3   Title  Pt will improve Rt shoulder and Rt LE strength to 5/5 MMT. 6 weeks 09/24/17    Status  Partially Met      PT LONG TERM GOAL #4   Title  Pt will improve cervical ROM to WNL. 6 weeks 09/24/17    Status  Achieved      PT LONG TERM GOAL #5   Title  Pt will report less  than 2/10 overall pain with all activities and ADL's. 6 weeks 09/24/17    Status  Partially Met   still taking OTC pain 2x/day, 0/10 pain with activity            Plan - 09/01/17 1622    Clinical Impression Statement  Pt demonstrated improved cervical ROM, Rt hip strength and Rt shoulder ROM.  He tolerated all exercises well without increase in symptoms. Pt has partially met LTG #3 and 5, has met #4.  Making good gains towards remaining goals.     Rehab Potential  Good    PT Frequency  2x / week    PT Duration  6 weeks    PT Treatment/Interventions  ADLs/Self Care Home Management;Cryotherapy;Electrical Stimulation;Iontophoresis 71m/ml Dexamethasone;Moist Heat;Traction;Ultrasound;Therapeutic activities;Therapeutic exercise;Neuromuscular re-education;Patient/family education;Manual techniques;Passive range of motion;Dry needling    PT Next Visit Plan  postural strengthening, balance exercises with RLE. Assess readiness to d/c.      Consulted and Agree with Plan of Care  Patient       Patient will benefit from skilled therapeutic intervention in order to improve the following deficits and impairments:  Decreased activity tolerance, Decreased range of motion, Decreased strength, Increased muscle spasms, Postural dysfunction, Pain  Visit Diagnosis: Chronic right shoulder pain  Cervical radiculopathy at C7  Right leg weakness     Problem List Patient Active Problem List   Diagnosis Date Noted  . Diabetes mellitus, type 2 ([place] 04/16/2016  . Hypertriglyceridemia 02/12/2014  . Calculus of kidney 04/03/2013  . DS (disseminated sclerosis) ([place] 04/03/2013  . Other and unspecified hyperlipidemia 01/02/2013  . LADA (latent autoimmune diabetes in adults), managed as type 1 ([place] 11/11/2012   JKerin Perna PTA 09/01/17 5:15 PM  [place] [address]Phone: [phone]  Fax:   [phone] Name: Austin RoyceMRN: [id number] of Birth: [birth year]  PHYSICAL THERAPY DISCHARGE SUMMARY  Visits from Start of Care: 6  Current functional level related to goals / functional outcomes: See above   Remaining deficits: See above   Plan: Patient agrees to discharge.  Patient goals were partially met. Patient is being discharged due to not returning since the last visit.  ?????      Elsie Ra, PT, DPT 10/06/17 11:27 AM

## 2017-09-06 ENCOUNTER — Encounter: Payer: BLUE CROSS/BLUE SHIELD | Admitting: Physical Therapy

## 2017-09-07 ENCOUNTER — Ambulatory Visit (INDEPENDENT_AMBULATORY_CARE_PROVIDER_SITE_OTHER): Payer: BLUE CROSS/BLUE SHIELD | Admitting: Family Medicine

## 2017-09-07 ENCOUNTER — Encounter: Payer: Self-pay | Admitting: Neurology

## 2017-09-07 ENCOUNTER — Telehealth: Payer: Self-pay | Admitting: Family Medicine

## 2017-09-07 ENCOUNTER — Encounter: Payer: Self-pay | Admitting: Family Medicine

## 2017-09-07 VITALS — BP 114/65 | HR 79 | Ht 71.0 in | Wt 170.0 lb

## 2017-09-07 DIAGNOSIS — Z23 Encounter for immunization: Secondary | ICD-10-CM | POA: Diagnosis not present

## 2017-09-07 DIAGNOSIS — R29898 Other symptoms and signs involving the musculoskeletal system: Secondary | ICD-10-CM

## 2017-09-07 DIAGNOSIS — M25511 Pain in right shoulder: Secondary | ICD-10-CM

## 2017-09-07 DIAGNOSIS — G35 Multiple sclerosis: Secondary | ICD-10-CM | POA: Diagnosis not present

## 2017-09-07 DIAGNOSIS — S46811A Strain of other muscles, fascia and tendons at shoulder and upper arm level, right arm, initial encounter: Secondary | ICD-10-CM

## 2017-09-07 MED ORDER — CYCLOBENZAPRINE HCL 10 MG PO TABS: 10.0000 mg | ORAL_TABLET | Freq: Every evening | ORAL | 0 refills | Status: DC | PRN

## 2017-09-07 NOTE — Progress Notes (Signed)
Subjective:    Patient ID: [name], male    DOB: 1970/07/06, 47 y.o.   MRN: [id number]  HPI 47 yo male is here today to f/u on several concerns.  He was actually seen on initially in July for right leg weakness as well as some neck pain radiating down into his right shoulder.  He is actually been going to physical therapy and has been for 6 sections of thus far.  He is been doing the exercises as well as a TENS unit and has had about a 50% improvement in his symptoms particularly in his neck and his shoulder.  But he still has pain that we will keep him awake at night.  Is not currently taking anything for pain.  No ibuprofen has helped some.  He describes the pain in his neck going towards her shoulder as a heaviness sensation.  And then sometimes the numbness feels like something is locking or clicking in the right shoulder particularly once he gets to about 90 degrees.  He is also been having right sided leg weakness.  He notices it more when he stands on one foot.  He notices it when going downstairs and so when he is putting a lot of weight on that right leg it feels like it is getting give out.  He says this started after he got overheated at the pool and then cooking out in the yard.  It has persisted and has really not improved at all.  He did note that back in 1999 he was actually diagnosed with MS.  He was treated for a time with Avonex and then Copaxone.  He was initially diagnosed by Dr. Marijean Bravo in [facility] but then was followed by Dr.Joel Annie Sable in [place], with [facility].  He then asked to have his care transferred more locally and consulted with a neurologist at [facility] in [place] who told him on repeat scan that his lesions look improved at that point he never returned.  Have a negative lumbar spine film in July.   Review of Systems  BP 114/65   Pulse 79   Ht 5' 11"  (1.803 m)   Wt 170 lb (77.1 kg)   SpO2 100%   BMI 23.71 kg/m     No Known Allergies  Past  Medical History:  Diagnosis Date  . Diabetes mellitus without complication ([place])   . Hypertension   . Multiple sclerosis (HCC) 1999   Dr Trula Ore  . Nephrolithiasis    sees urology    Past Surgical History:  Procedure Laterality Date  . APPENDECTOMY  2003  . LITHOTRIPSY      Social History   Socioeconomic History  . Marital status: Married    Spouse name: christy  . Number of children: 2  . Years of education: Not on file  . Highest education level: Not on file  Occupational History  . Occupation: IT    Comment: works in [place]  . Financial resource strain: Not on file  . Food insecurity:    Worry: Not on file    Inability: Not on file  . Transportation needs:    Medical: Not on file    Non-medical: Not on file  Tobacco Use  . Smoking status: Former Smoker    Packs/day: 1.00    Types: Cigarettes    Last attempt to quit: 10/28/2011    Years since quitting: 5.8  . Smokeless tobacco: Never Used  Substance and Sexual  Activity  . Alcohol use: No  . Drug use: No  . Sexual activity: Yes  Lifestyle  . Physical activity:    Days per week: Not on file    Minutes per session: Not on file  . Stress: Not on file  Relationships  . Social connections:    Talks on phone: Not on file    Gets together: Not on file    Attends religious service: Not on file    Active member of club or organization: Not on file    Attends meetings of clubs or organizations: Not on file    Relationship status: Not on file  . Intimate partner violence:    Fear of current or ex partner: Not on file    Emotionally abused: Not on file    Physically abused: Not on file    Forced sexual activity: Not on file  Other Topics Concern  . Not on file  Social History Narrative  . Not on file    Family History  Problem Relation Age of Onset  . Hypertension Mother   . Alcohol abuse Father   . Cancer Maternal Grandfather   . Diabetes Maternal Aunt     Outpatient Encounter  Medications as of 09/07/2017  Medication Sig  . aspirin 81 MG chewable tablet Chew 81 mg by mouth.  . INVOKAMET 50-1000 MG TABS   . lisinopril (PRINIVIL,ZESTRIL) 5 MG tablet   . simvastatin (ZOCOR) 40 MG tablet TAKE 1 TABLET BY MOUTH DAILY AT 6:00 PM **NEED LABS DRAWN**  . cyclobenzaprine (FLEXERIL) 10 MG tablet Take 1 tablet (10 mg total) by mouth at bedtime as needed for muscle spasms.  . [DISCONTINUED] blood glucose meter kit and supplies KIT Dispense based on patient and insurance preference. Use up to three times daily as directed. DX: E11.9  . [DISCONTINUED] glucose blood (BAYER CONTOUR NEXT TEST) test strip Use to monitor blood sugar testing two times daily. Dx: E13.9  . [DISCONTINUED] metFORMIN (GLUCOPHAGE) 1000 MG tablet Take 1 tablet by mouth twice daily with a meal   No facility-administered encounter medications on file as of 09/07/2017.          Objective:   Physical Exam  Constitutional: He is oriented to person, place, and time. He appears well-developed and well-nourished.  HENT:  Head: Normocephalic and atraumatic.  Eyes: Conjunctivae and EOM are normal.  Cardiovascular: Normal rate.  Pulmonary/Chest: Effort normal.  Musculoskeletal:  Cervical spine with normal flexion, oxygen, rotation right left and side bending.  Nontender of the cervical spine.  Shoulder with normal range of motion though he had a little bit more difficulty because of discomfort getting to full extension with the right arm compared to the left.  Strength is 5 out of 5 the shoulder elbow and wrist.  No discomfort or pain with internal/external rotation.  Negative empty can sign.  Good liftoff test that symmetric compared to the left side.  Right hip flexion is just slightly weaker compared to the left but barely noticeable.  Knee, ankle strength is 5 out of 5 with flexion and extension.  Neurological: He is alert and oriented to person, place, and time. He displays normal reflexes. No cranial nerve  deficit. He exhibits normal muscle tone. Coordination normal.  Reflexes 2+ in the upper and lower extremities.  Very symmetric.  Rapid alternating movements are normal and symmetric.  Skin: Skin is dry. No pallor.  Psychiatric: He has a normal mood and affect. His behavior is normal.  Vitals reviewed.  Assessment & Plan:  Right lower extremity weakness-him to go ahead and get a brain MRI with and without contrast since there is this history of MS about 20 years ago.  He is not really having any pain in the back or in that right leg area it really is just true weakness.  Will refer him to neurology.  Right shoulder pain-he is about 50% better after physical therapy and we discussed the possibility of moving forward with an MRI for further evaluation.    Right cervical pain over the trapezius muscle-we will try a trial of a low-dose muscle relaxer at bedtime just to see if he feels like this is making more him more comfortable, and he is sleeping better.  He really feels like the pain and lack of sleep are really significantly impairing his quality of life right now.

## 2017-09-07 NOTE — Telephone Encounter (Signed)
I spoke with patient and he states all has been taken care of. I did advise patient to call triage in the future.

## 2017-09-07 NOTE — Telephone Encounter (Signed)
I spoke with Dr. Charise Carwin who stated that you have tried calling and when unable to get a hold of me.  That is not what we want to be doing.  I always want to hear from you if you are having problems.  I hear that you are somewhat better but not all better from your shoulder.  I am happy to see you anytime in the near future if he like to schedule an appointment.

## 2017-09-13 ENCOUNTER — Encounter: Payer: Self-pay | Admitting: Family Medicine

## 2017-09-15 MED FILL — INVOKAMET 50-1,000 MG TAB: 50-1000 | 30 days supply | Qty: 60 | Fill #5

## 2017-09-15 MED FILL — SIMVASTATIN 40 MG TABLET: 40 | 90 days supply | Qty: 90 | Fill #1

## 2017-09-15 MED FILL — LISINOPRIL 5 MG TABLET: 5 | 90 days supply | Qty: 90 | Fill #0

## 2017-09-16 DIAGNOSIS — G35 Multiple sclerosis: Secondary | ICD-10-CM | POA: Diagnosis not present

## 2017-09-16 DIAGNOSIS — S46811A Strain of other muscles, fascia and tendons at shoulder and upper arm level, right arm, initial encounter: Secondary | ICD-10-CM | POA: Diagnosis not present

## 2017-09-16 DIAGNOSIS — R29898 Other symptoms and signs involving the musculoskeletal system: Secondary | ICD-10-CM | POA: Diagnosis not present

## 2017-09-16 DIAGNOSIS — M25511 Pain in right shoulder: Secondary | ICD-10-CM | POA: Diagnosis not present

## 2017-09-16 LAB — BASIC METABOLIC PANEL WITH GFR
BUN: 21 mg/dL (ref 7–25)
CO2: 27 mmol/L (ref 20–32)
CREATININE: 0.78 mg/dL (ref 0.60–1.35)
Calcium: 10.5 mg/dL — ABNORMAL HIGH (ref 8.6–10.3)
Chloride: 102 mmol/L (ref 98–110)
GFR, Est African American: 125 mL/min/{1.73_m2} (ref >=60)
GFR, Est Non African American: 108 mL/min/{1.73_m2} (ref >=60)
GLUCOSE: 125 mg/dL — AB (ref 65–99)
Potassium: 4.2 mmol/L (ref 3.5–5.3)
Sodium: 139 mmol/L (ref 135–146)

## 2017-09-17 ENCOUNTER — Other Ambulatory Visit: Payer: Self-pay | Admitting: *Deleted

## 2017-09-18 ENCOUNTER — Ambulatory Visit (HOSPITAL_BASED_OUTPATIENT_CLINIC_OR_DEPARTMENT_OTHER)
Admission: RE | Admit: 2017-09-18 | Discharge: 2017-09-18 | Disposition: A | Discharge status: Home / Self Care | Payer: BLUE CROSS/BLUE SHIELD | Source: Ambulatory Visit | Attending: Family Medicine | Admitting: Family Medicine

## 2017-09-18 DIAGNOSIS — G35 Multiple sclerosis: Secondary | ICD-10-CM | POA: Diagnosis not present

## 2017-09-18 DIAGNOSIS — G9389 Other specified disorders of brain: Secondary | ICD-10-CM | POA: Insufficient documentation

## 2017-09-18 DIAGNOSIS — S46811A Strain of other muscles, fascia and tendons at shoulder and upper arm level, right arm, initial encounter: Secondary | ICD-10-CM

## 2017-09-18 DIAGNOSIS — R29898 Other symptoms and signs involving the musculoskeletal system: Secondary | ICD-10-CM | POA: Diagnosis not present

## 2017-09-18 DIAGNOSIS — X58XXXA Exposure to other specified factors, initial encounter: Secondary | ICD-10-CM | POA: Diagnosis not present

## 2017-09-18 DIAGNOSIS — M25511 Pain in right shoulder: Secondary | ICD-10-CM

## 2017-09-18 DIAGNOSIS — R531 Weakness: Secondary | ICD-10-CM | POA: Diagnosis not present

## 2017-09-18 DIAGNOSIS — Z23 Encounter for immunization: Secondary | ICD-10-CM

## 2017-09-18 MED ORDER — GADOBENATE DIMEGLUMINE 529 MG/ML IV SOLN
15.0000 mL | Freq: Once | INTRAVENOUS | Status: AC | PRN
  Administered 2017-09-18: 15 mL via INTRAVENOUS

## 2017-09-21 MED FILL — CYCLOBENZAPRINE HCL 10 MG T: 10 | 30 days supply | Qty: 30 | Fill #0

## 2017-09-22 NOTE — Progress Notes (Signed)
Patient is scheduled with [facility] on 9/11 and aware of the appointment - CF

## 2017-09-23 ENCOUNTER — Encounter: Payer: Self-pay | Admitting: Family Medicine

## 2017-09-23 ENCOUNTER — Ambulatory Visit (INDEPENDENT_AMBULATORY_CARE_PROVIDER_SITE_OTHER): Payer: BLUE CROSS/BLUE SHIELD | Admitting: Family Medicine

## 2017-09-23 DIAGNOSIS — R9082 White matter disease, unspecified: Secondary | ICD-10-CM

## 2017-09-23 DIAGNOSIS — F411 Generalized anxiety disorder: Secondary | ICD-10-CM

## 2017-09-23 DIAGNOSIS — R197 Diarrhea, unspecified: Secondary | ICD-10-CM | POA: Diagnosis not present

## 2017-09-23 MED ORDER — FLUOXETINE HCL 20 MG PO TABS: ORAL_TABLET | ORAL | 1 refills | Status: DC

## 2017-09-23 MED FILL — FLUoxetine HCL 20 MG TABS: 20 | 33 days supply | Qty: 30 | Fill #0

## 2017-09-23 NOTE — Progress Notes (Signed)
Subjective:    Patient ID: [name], male    DOB: [birth year], 47 y.o.   MRN: [id number]  HPI 47 year old male is here today to follow-up on recent MRI results.  He was actually diagnosed about 20 years ago back in 1999 with MS and was actually treated with Copaxone and then Rebif.  But at some point his symptoms remitted and he was lost to follow-up.  More recently though he came in complaining of right lower extremity weakness.  We decided to get an MRI scan.  He unfortunately has been experiencing some significant anxiety.  He is been having difficulty sleeping at night.  Waking up feeling just almost sad and depressed.  Having a difficult time getting it out of his thoughts during the day.  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  He reports feeling nervous and on edge nearly every day as well as difficulty controlling his worry nearly every day.  He also complains of some irritability several days of the week.  He also reports feeling down and depressed and has had thoughts of being better off dead several days .  His wife is here today with him and is very supportive.  He also wanted to discuss that he is having loose, frequent and urgent stools.  He is currently on invoke a met twice a day.   Review of Systems  There were no vitals taken for this visit.    No Known Allergies  Past Medical History:  Diagnosis Date  . Diabetes mellitus without complication ([place])   . Hypertension   . Multiple sclerosis (HCC) 1999   Dr Trula Ore  . Nephrolithiasis    sees urology    Past Surgical History:  Procedure Laterality Date  . APPENDECTOMY  2003  . LITHOTRIPSY      Social History   Socioeconomic History  . Marital status: Married    Spouse name: christy  . Number of children: 2  . Years of education: Not on file  . Highest  education level: Not on file  Occupational History  . 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 Financial resource strain: Not on file  . Food insecurity:    Worry: Not on file    Inability: Not on file  . Transportation needs:    Medical: Not on file    Non-medical: Not on file  Tobacco Use  . Smoking status: Former Smoker    Packs/day: 1.00    Types: Cigarettes    Last attempt to quit: 10/28/2011    Years since quitting: 5.9  . Smokeless tobacco: Never Used  Substance and Sexual Activity  . Alcohol use: No  . Drug use: No  . Sexual activity: Yes  Lifestyle  . Physical activity:    Days per week: Not on file    Minutes per session: Not on file  . Stress: Not on file  Relationships  . Social connections:    Talks on phone: Not on file    Gets together: Not on file    Attends religious service: Not on file    Active member of club or organization: Not on file    Attends meetings of clubs or organizations: Not on file    Relationship status: Not on file  . Intimate partner violence:  Fear of current or ex partner: Not on file    Emotionally abused: Not on file    Physically abused: Not on file    Forced sexual activity: Not on file  Other Topics Concern  . Not on file  Social History Narrative  . Not on file    Family History  Problem Relation Age of Onset  . Hypertension Mother   . Alcohol abuse Father   . Cancer Maternal Grandfather   . Diabetes Maternal Aunt     Outpatient Encounter Medications as of 09/23/2017  Medication Sig  . aspirin 81 MG chewable tablet Chew 81 mg by mouth.  . cyclobenzaprine (FLEXERIL) 10 MG tablet Take 1 tablet (10 mg total) by mouth at bedtime as needed for muscle spasms.  . INVOKAMET 50-1000 MG TABS   . lisinopril (PRINIVIL,ZESTRIL) 5 MG tablet   . simvastatin (ZOCOR) 40 MG tablet TAKE 1 TABLET BY MOUTH DAILY AT 6:00 PM **NEED LABS DRAWN** (Patient taking differently: Take by mouth daily at 6 PM. )  .  tamsulosin (FLOMAX) 0.4 MG CAPS capsule Take 0.4 mg by mouth.  Marland Kitchen FLUoxetine (PROZAC) 20 MG tablet 1/2 tab po QD x 6 days, then increase to whole tab daily   No facility-administered encounter medications on file as of 09/23/2017.          Objective:   Physical Exam  Constitutional: He is oriented to person, place, and time. He appears well-developed and well-nourished.  HENT:  Head: Normocephalic and atraumatic.  Eyes: Conjunctivae and EOM are normal.  Cardiovascular: Normal rate.  Pulmonary/Chest: Effort normal.  Neurological: He is alert and oriented to person, place, and time.  Skin: Skin is dry. No pallor.  Psychiatric: He has a normal mood and affect. His behavior is normal.  Vitals reviewed.       Assessment & Plan:  GAD with depression- GAD 7 score of 17.  PHQ 9 score of 21 since he is having both depressive and anxiety to symptoms decided to start him on fluoxetine will start with 10 mg daily for 6 days and then increase to 20 mg.  I will see him back in about 3 weeks.  Warned about potential side effects of the medication and call if he has any problems or concerns.  We are unable to use this medication with treatment that is recommended then we can always change it if needed.  Periventricular white matter changes-likely consistent with MS but he is getting in with neurology.  We were able to get him him next week and set of November.  He can go over the information and confirm diagnosis at that time.  I did answer his many questions as I was able to today.  Frequent, loose and urgent stools-likely from the metformin component.  Discussed that he could consider switching to the extended release form of metformin or decreasing his dose on the metformin.  This is actually a pretty common side effect and encouraged him to discuss it with his endocrinologist who writes his invoke a met.  Encouraged him to call their office or he can certainly wait until his appointment next month if  he prefers.

## 2017-09-24 MED FILL — TAMSULOSIN HCL 0.4 MG CAP: 0.4 | 30 days supply | Qty: 30 | Fill #0

## 2017-09-29 ENCOUNTER — Encounter: Payer: Self-pay | Admitting: Neurology

## 2017-09-29 ENCOUNTER — Ambulatory Visit (INDEPENDENT_AMBULATORY_CARE_PROVIDER_SITE_OTHER): Payer: BLUE CROSS/BLUE SHIELD | Admitting: Neurology

## 2017-09-29 ENCOUNTER — Telehealth: Payer: Self-pay | Admitting: Neurology

## 2017-09-29 ENCOUNTER — Other Ambulatory Visit: Payer: Self-pay | Admitting: Neurology

## 2017-09-29 VITALS — BP 142/98 | HR 100 | Ht 71.0 in | Wt 167.0 lb

## 2017-09-29 DIAGNOSIS — R413 Other amnesia: Secondary | ICD-10-CM

## 2017-09-29 DIAGNOSIS — R292 Abnormal reflex: Secondary | ICD-10-CM

## 2017-09-29 DIAGNOSIS — R6889 Other general symptoms and signs: Secondary | ICD-10-CM

## 2017-09-29 DIAGNOSIS — G35 Multiple sclerosis: Secondary | ICD-10-CM | POA: Insufficient documentation

## 2017-09-29 DIAGNOSIS — R9089 Other abnormal findings on diagnostic imaging of central nervous system: Secondary | ICD-10-CM

## 2017-09-29 DIAGNOSIS — G9389 Other specified disorders of brain: Secondary | ICD-10-CM | POA: Diagnosis not present

## 2017-09-29 DIAGNOSIS — F09 Unspecified mental disorder due to known physiological condition: Secondary | ICD-10-CM | POA: Insufficient documentation

## 2017-09-29 NOTE — Progress Notes (Signed)
Provider:  Larey Seat, M D  Referring Provider: Hali Marry, * Primary Care Physician:  Hali Marry, MD  Chief Complaint  Patient presents with  . New Patient (Initial Visit)    pt alone, rm 11. in 99 pt developed right sided numbness. he went and had evaluated and followed neurologist at [facility] and was diagnosed through them with readmitting and relapsing MS after MRI/spinal tap. symptoms resolved quickly and was following [facility] until last year transferred to [facility] and that neurologist stated MS related and dc his medications. no check ups until jan 2019 issues on the right side, arm, neck and leg.   . Follow-up    he thought he slept wrong and was treated with PT to help with the symptoms, then he addressed with PCP and the PCP did MRI and wanted him to establish with neurologist.    HPI:  [name] is a 47 y.o. male and seen here with his wife on 09-29-2017 in a referral  from Dr. Madilyn Fireman for an evalution of right body weakness,  Associated with a diagnosis of MS in 1999, that was later disputed.  [name] is a married 47 year old gentleman who distinctly remembers that his wife was pregnant at the time when he was first suspected to have symptoms of MS.  It was in 1999 and he had woken up noticing his right arm was numb and tingly.  The lack of feeling did not correlate to weakness.  He was first evaluated by physical therapy, thinking he may have slept wrong position his arm the wrong way. He saw Dr Marijean Bravo In [facility].  Later he saw Dr. Cornell Barman at [facility] for a second opinion- his diagnosis was a mild or new onset of multiple sclerosis.   He was treated with injectable medications, did not suffer any relapses or exacerbations, he never became in any way disabled, and all symptoms of dysesthesias or sensory loss resolved. He was exquisitely sensitive to heat and humidity, which was felt to be related to MS. Only once did he receive  treatment by steroid drip, at the initial diagnosis.  The diagnosis was later revised when he transferred care closer to home and was seen at a private neurology practice in [place], Dr. Benjamine Sprague, MD  but not [facility]. This was over a decade ago and in the meantime he is done very well. He no longer took Rebif. In July of this year [name] developed right leg weakness as well as some neck pain radiating down to his right shoulder he went through with physical therapy had 6 sessions, and noted that the numbness did somewhat improve but it felt more like locking or clicking in the right shoulder with certain movements.  He continues having right-sided leg weakness.  Notices most when he stands on his right leg.  Is also evident when descending stairs.  He needs to hold onto the handrail when he does so.  He is currently taking Flexeril, Zocor, Prinivil, aspirin, no other prescription medications were named.  He works as a Orthoptist, Teaching laboratory technician based - he relays a lot more on notes. - his wife mention insomnia.    Review of Systems: Out of a complete 14 system review, the patient complains of only the following symptoms, and all other reviewed systems are negative. Anxiety - right leg weakness. normal lumbar spine- abnormal memory.  MRI _______________CLINICAL DATA:  Multiple sclerosis diagnosed in 1999. Right-sided  weakness with imbalance  EXAM: MRI HEAD WITHOUT AND WITH CONTRAST  TECHNIQUE: Multiplanar, multiecho pulse sequences of the brain and surrounding structures were obtained without and with intravenous contrast.  CONTRAST:  32mL MULTIHANCE GADOBENATE DIMEGLUMINE 529 MG/ML IV SOLN  COMPARISON:  None.  FINDINGS: Brain: FLAIR hyperintensity around the lateral ventricles with areas of nodular ovoid morphology superimposed on thin confluent rim. No clear juxta cortical or infratentorial signal abnormality. No restricted diffusion. There is a small  nodular focus of enhancement along the occipital horn of the left lateral ventricle, see 12:6.  Ventriculomegaly which may be related to central predominant atrophy. However, there is up lifting of the corpus callosum and is proportionate subarachnoid spaces, with crowding at the vertex.  No infarct, hemorrhage, or collection.  Vascular: Major flow voids and vascular enhancements are preserved  Skull and upper cervical spine: No evidence of marrow lesion  Sinuses/Orbits: Negative  IMPRESSION: 1. Moderate periventricular white matter disease in this patient with history of multiple sclerosis. Small nodular focus of enhancement about the occipital horn of the left lateral ventricle is presumably a site of active demyelination. 2. 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   Electronically Signed   By: Monte Fantasia M.D.   On: 09/18/2017 13:05  Social History   Socioeconomic History  . Marital status: Married    Spouse name: [name]  . Number of children: 2  . Years of education: Not on file  . Highest education level: Not on file  Occupational History  . 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 Financial resource strain: Not on file  . Food insecurity:    Worry: Not on file    Inability: Not on file  . Transportation needs:    Medical: Not on file    Non-medical: Not on file  Tobacco Use  . Smoking status: Former Smoker    Packs/day: 1.00    Types: Cigarettes    Last attempt to quit: 10/28/2011    Years since quitting: 5.9  . Smokeless tobacco: Never Used  Substance and Sexual Activity  . Alcohol use: No  . Drug use: No  . Sexual activity: Yes  Lifestyle  . Physical activity:    Days per week: Not on file    Minutes per session: Not on file  . Stress: Not on file  Relationships  . Social connections:    Talks on  phone: Not on file    Gets together: Not on file    Attends religious service: Not on file    Active member of club or organization: Not on file    Attends meetings of clubs or organizations: Not on file    Relationship status: Not on file  . Intimate partner violence:    Fear of current or ex partner: Not on file    Emotionally abused: Not on file    Physically abused: Not on file    Forced sexual activity: Not on file  Other Topics Concern  . Not on file  Social History Narrative  . Not on file    Family History  Problem Relation Age of Onset  . Hypertension Mother   . Alcohol abuse Father   . Cancer Maternal Grandfather   . Diabetes Maternal Aunt     Past Medical History:  Diagnosis Date  . Diabetes mellitus without complication ([place])   . Hypertension   .  Multiple sclerosis (HCC) 1999   Dr Trula Ore  . Nephrolithiasis    sees urology    Past Surgical History:  Procedure Laterality Date  . APPENDECTOMY  2003  . LITHOTRIPSY      Current Outpatient Medications  Medication Sig Dispense Refill  . aspirin 81 MG chewable tablet Chew 81 mg by mouth.    . cyclobenzaprine (FLEXERIL) 10 MG tablet Take 1 tablet (10 mg total) by mouth at bedtime as needed for muscle spasms. 30 tablet 0  . FLUoxetine (PROZAC) 20 MG tablet 1/2 tab po QD x 6 days, then increase to whole tab daily 30 tablet 1  . INVOKAMET 50-1000 MG TABS   5  . lisinopril (PRINIVIL,ZESTRIL) 5 MG tablet   2  . simvastatin (ZOCOR) 40 MG tablet TAKE 1 TABLET BY MOUTH DAILY AT 6:00 PM **NEED LABS DRAWN** (Patient taking differently: Take by mouth daily at 6 PM. ) 15 tablet 0  . tadalafil (CIALIS) 5 MG tablet Take 5 mg by mouth daily as needed for erectile dysfunction.    . tamsulosin (FLOMAX) 0.4 MG CAPS capsule Take 0.4 mg by mouth.     No current facility-administered medications for this visit.     Allergies as of 09/29/2017  . (No Known Allergies)    Vitals: BP (!) 142/98   Pulse 100   Ht 5\' 11"  (1.803  m)   Wt 167 lb (75.8 kg)   BMI 23.29 kg/m  Last Weight:  Wt Readings from Last 1 Encounters:  09/29/17 167 lb (75.8 kg)   Last Height:   Ht Readings from Last 1 Encounters:  09/29/17 5\' 11"  (1.803 m)    Physical exam:  General: The patient is awake, alert and appears not in acute distress. The patient is well groomed. Head: Normocephalic, atraumatic. Neck is supple. Mallampati 2, neck circumference 15 Cardiovascular:  Regular rate and rhythmRespiratory: Lungs are clear to auscultation. Skin:  Without evidence of edema, or rash Trunk: BMI is normal, as is  posture.  Neurologic exam : The patient is awake and alert, oriented to place and time.  Memory subjective described as impaired- short term and long term, he had trouble remembering the time and place some family pictures.  .  There is some distractibly. Speech is fluent without dysarthria, dysphonia or aphasia. Mood and affect are slightly anxious.   Cranial nerves: Pupils are equal in size and bilaterally briskly reactive to light. The left pupil dilated while light exposed. Funduscopic exam without evidence of pallor or edema. Extraocular movements  in vertical and horizontal planes intact and without nystagmus.  Visual fields by finger perimetry are intact. Hearing to finger rub intact. Facial sensation intact to fine touch. Facial motor strength is symmetric and tongue and uvula move midline. Tongue protrusion into either cheek is normal. Shoulder shrug is normal.   Motor exam:   Normal tone ,muscle bulk and symmetric  strength in all extremities. Sensory:  Fine touch, pinprick and vibration were tested in all extremities. Proprioception was normal. Vibration preserved ( patient has diabetes )  Coordination: Rapid alternating movements in the fingers/hands were normal. Finger-to-nose maneuver  normal without evidence of ataxia, dysmetria or tremor.  Gait and station: Patient walks without assistive device and is able  unassisted to climb up to the exam table.  He turns with 3 steps, was able to stand on tip toes and heels, appeared to have some foot drop - " walking flat footed" . Strength within normal limits ![name] is  stable and normal. Tandem gait is unfragmented. Romberg testing is negative   Deep tendon reflexes: very brisk - jumpy- in  upper and lower extremities . Babinski maneuver response is  downgoing.  Assessment:  After physical and neurologic examination, review of laboratory studies, imaging, neurophysiology testing and pre-existing records, assessment is that of :  I am more concerned about the large ventricles- His MRI white matter disease is not exclusively diagnostic for MS.  His history is not necessarily typical for MS.  He has a orthopedic problem in his left shoulder, not related to a known injury.   Plan:  Treatment plan and additional workup :  MRI cervical spine.   CSF testing under fluroscopy- oligoclonal bands.   Referral to Dr. Felecia Shelling, if CSF positive oligo-clonals.   Austin Partridge Merwyn Hodapp MD 09/29/2017

## 2017-09-29 NOTE — Telephone Encounter (Signed)
[employer] Auth: [id number] (exp. 09/29/17 to 10/28/17) roder sent to GI. They will reach out to the pt to schedule.

## 2017-10-11 ENCOUNTER — Ambulatory Visit
Admission: RE | Admit: 2017-10-11 | Discharge: 2017-10-11 | Disposition: A | Discharge status: Home / Self Care | Payer: BLUE CROSS/BLUE SHIELD | Source: Ambulatory Visit | Attending: Neurology | Admitting: Neurology

## 2017-10-11 DIAGNOSIS — R292 Abnormal reflex: Secondary | ICD-10-CM

## 2017-10-11 DIAGNOSIS — R9089 Other abnormal findings on diagnostic imaging of central nervous system: Secondary | ICD-10-CM | POA: Diagnosis not present

## 2017-10-11 DIAGNOSIS — R6889 Other general symptoms and signs: Secondary | ICD-10-CM | POA: Diagnosis not present

## 2017-10-11 DIAGNOSIS — G9389 Other specified disorders of brain: Secondary | ICD-10-CM

## 2017-10-11 DIAGNOSIS — G35 Multiple sclerosis: Secondary | ICD-10-CM | POA: Diagnosis not present

## 2017-10-11 MED ORDER — GADOBENATE DIMEGLUMINE 529 MG/ML IV SOLN
15.0000 mL | Freq: Once | INTRAVENOUS | Status: AC | PRN
  Administered 2017-10-11: 15 mL via INTRAVENOUS

## 2017-10-12 DIAGNOSIS — E1169 Type 2 diabetes mellitus with other specified complication: Secondary | ICD-10-CM | POA: Diagnosis not present

## 2017-10-12 DIAGNOSIS — E119 Type 2 diabetes mellitus without complications: Secondary | ICD-10-CM | POA: Diagnosis not present

## 2017-10-12 DIAGNOSIS — E782 Mixed hyperlipidemia: Secondary | ICD-10-CM | POA: Diagnosis not present

## 2017-10-12 MED FILL — INVOKAMET XR 50-1,000 MG TA: 50-1000 | 30 days supply | Qty: 60 | Fill #0

## 2017-10-13 ENCOUNTER — Telehealth: Payer: Self-pay | Admitting: Neurology

## 2017-10-13 LAB — BASIC METABOLIC PANEL
BUN: 13 mg/dL (ref 7–25)
CHLORIDE: 102 mmol/L (ref 98–110)
CO2: 25 mmol/L (ref 20–32)
Calcium: 10.1 mg/dL (ref 8.6–10.3)
Creat: 0.71 mg/dL (ref 0.60–1.35)
Glucose, Bld: 157 mg/dL — ABNORMAL HIGH (ref 65–99)
Potassium: 3.8 mmol/L (ref 3.5–5.3)
Sodium: 140 mmol/L (ref 135–146)

## 2017-10-13 NOTE — Telephone Encounter (Signed)
Patient returned call and I was able to go over the MRI cervial spine with him. I advised the findings were consistent with diagnosis of MS. Dr Dohmeier recommends the patient move forward with having the LP completed so that we can test the spinal fluid. Patient will contact them and get scheduled. Once he has an apt date he will call us so that we can get him scheduled for a follow up visit. We would have to allow enough time to get all the results back from LP before scheduling that way we have all the information. Pt verbalized understanding. Pt had no questions at this time but was encouraged to call back if questions arise. Will wait for his call back.

## 2017-10-13 NOTE — Telephone Encounter (Signed)
Called the patient to review the MRI results with the patient. LVM instructing the patient to call back to discuss.

## 2017-10-13 NOTE — Telephone Encounter (Signed)
-----   Message from Larey Seat, MD sent at 10/12/2017  4:48 PM EDT ----- IMPRESSION: This MRI of the cervical spine with and without contrast shows  the following: Abnormal study with multiple lesions attributed to multiple sclerosis, none acute.    1.     There are several T2 hyperintense foci within the spinal cord  adjacent to C2, C3-C4, C4, C6 and C7-T1 as detailed above.  None of these  appear to be acute and they do not enhance.   They are consistent with  chronic demyelinating plaque associated with multiple sclerosis. 2.     There is borderline spinal stenosis at C5-C6 and mild degenerative  changes at C4-C5 and C6-C7.  There is no nerve root compression at any of  these levels. 3.     There is a normal enhancement pattern.    INTERPRETING PHYSICIAN:  Richard A. Felecia Shelling, MD, PhD, FAAN Certified in  Neuroimaging by [employer] Signed By:  Britt Bottom, MD on 10/11/2017  5:35 PM

## 2017-10-14 ENCOUNTER — Ambulatory Visit (INDEPENDENT_AMBULATORY_CARE_PROVIDER_SITE_OTHER): Payer: BLUE CROSS/BLUE SHIELD | Admitting: Family Medicine

## 2017-10-14 ENCOUNTER — Encounter: Payer: Self-pay | Admitting: Family Medicine

## 2017-10-14 VITALS — BP 154/85 | HR 82 | Ht 71.0 in | Wt 174.0 lb

## 2017-10-14 DIAGNOSIS — M25511 Pain in right shoulder: Secondary | ICD-10-CM

## 2017-10-14 DIAGNOSIS — F411 Generalized anxiety disorder: Secondary | ICD-10-CM

## 2017-10-14 DIAGNOSIS — G35 Multiple sclerosis: Secondary | ICD-10-CM | POA: Diagnosis not present

## 2017-10-14 NOTE — Addendum Note (Signed)
Addended by: Beatrice Lecher D on: 10/14/2017 05:14 PM   Modules accepted: Level of Service

## 2017-10-14 NOTE — Telephone Encounter (Signed)
Pt called and is scheduled for LP on 10/9. I advised the pt that we will give a couple weeks to make sure we have all the results. I have added him to the schedule for 11/11/17 at 10:30 am.

## 2017-10-14 NOTE — Progress Notes (Addendum)
Subjective:    Patient ID: [name], male    DOB: 27-Mar-1970, 47 y.o.   MRN: [id number]  HPI F/U GAD/Anxiety - Last GAD 7 score of 17.  PHQ 9 score of 21 since he is having both depressive and anxiety to symptoms decided to start him on fluoxetine will start with 10 mg daily for 6 days and then increase to 20 mg.  He did fill the prescription but says he decided not to take it.  After he met with the neurologist and felt like he had a game plan in place some of his stress and anxiety actually came down a good bit.  Right now he would rather hold off but said he would still consider taking it in the future if he felt like his mood got worse.  His wife is here from the visit today.  Sleep is fair.  F/U right shoulder pain -he still says his right shoulder is bothering him just a little bit.  Is taking a little bit longer to get comfortable with his shoulder at night when he goes to bed.  But is not nearly as tender and painful as it was several weeks ago.  He did complete 3 weeks of physical therapy going twice per week.  He says it did help.  He did try the muscle relaxer as well and says that did help as well.  Not been doing his home exercises.  Diabetes-he did update me that he is now on the invoke a met XR  Multiple sclerosis-he is going additional work-up with Dr. Brett Fairy in fact he is scheduled for a spinal tap next week and then has a follow-up later in October.  He has not had any change in his symptoms.  Review of Systems   BP (!) 154/85   Pulse 82   Ht _0  (1.803 m)   Wt 174 lb (78.9 kg)   SpO2 99%   BMI 24.27 kg/m     No Known Allergies  Past Medical History:  Diagnosis Date  . Diabetes mellitus without complication ([place])   . Hypertension   . Multiple sclerosis (HCC) 1999   Dr Trula Ore  . Nephrolithiasis    sees urology    Past Surgical History:  Procedure Laterality Date  . APPENDECTOMY  2003  . LITHOTRIPSY      Social History   Socioeconomic  History  . Marital status: Married    Spouse name: christy  . Number of children: 2  . Years of education: Not on file  . Highest education level: Not on file  Occupational History  . Occupation: IT    Comment: works in [place]  . Financial resource strain: Not on file  . Food insecurity:    Worry: Not on file    Inability: Not on file  . Transportation needs:    Medical: Not on file    Non-medical: Not on file  Tobacco Use  . Smoking status: Former Smoker    Packs/day: 1.00    Types: Cigarettes    Last attempt to quit: 10/28/2011    Years since quitting: 5.9  . Smokeless tobacco: Never Used  Substance and Sexual Activity  . Alcohol use: No  . Drug use: No  . Sexual activity: Yes  Lifestyle  . Physical activity:    Days per week: Not on file    Minutes per session: Not on file  . Stress: Not on file  Relationships  .  Social connections:    Talks on phone: Not on file    Gets together: Not on file    Attends religious service: Not on file    Active member of club or organization: Not on file    Attends meetings of clubs or organizations: Not on file    Relationship status: Not on file  . Intimate partner violence:    Fear of current or ex partner: Not on file    Emotionally abused: Not on file    Physically abused: Not on file    Forced sexual activity: Not on file  Other Topics Concern  . Not on file  Social History Narrative  . Not on file    Family History  Problem Relation Age of Onset  . Hypertension Mother   . Alcohol abuse Father   . Cancer Maternal Grandfather   . Diabetes Maternal Aunt     Outpatient Encounter Medications as of 10/14/2017  Medication Sig  . Canagliflozin-metFORMIN HCl ER (INVOKAMET XR) 50-1000 MG TB24 Take 1 tablet by mouth daily.  . cyclobenzaprine (FLEXERIL) 10 MG tablet Take 1 tablet (10 mg total) by mouth at bedtime as needed for muscle spasms.  . INVOKAMET 50-1000 MG TABS   . lisinopril (PRINIVIL,ZESTRIL) 5  MG tablet   . simvastatin (ZOCOR) 40 MG tablet TAKE 1 TABLET BY MOUTH DAILY AT 6:00 PM **NEED LABS DRAWN** (Patient taking differently: Take 40 mg by mouth daily at 6 PM. )  . tadalafil (CIALIS) 5 MG tablet Take 5 mg by mouth daily as needed for erectile dysfunction.  . tamsulosin (FLOMAX) 0.4 MG CAPS capsule Take 0.4 mg by mouth.  Marland Kitchen FLUoxetine (PROZAC) 20 MG tablet 1/2 tab po QD x 6 days, then increase to whole tab daily (Patient not taking: Reported on 10/14/2017)  . [DISCONTINUED] aspirin 81 MG chewable tablet Chew 81 mg by mouth.   No facility-administered encounter medications on file as of 10/14/2017.           Objective:   Physical Exam  Constitutional: He is oriented to person, place, and time. He appears well-developed and well-nourished.  HENT:  Head: Normocephalic and atraumatic.  Eyes: Conjunctivae and EOM are normal.  Cardiovascular: Normal rate.  Pulmonary/Chest: Effort normal.  Musculoskeletal:  Right shoulder with normal flexion and extension.  Symmetric external rotation both shoulders.  Nontender over the shoulder joint itself.  Several weeks ago he said it would be very tender to touch over the top of the shoulder.  Crossover test is normal.  Liftoff test off his lower back is normal as well.  Strength is symmetric.  Negative anti-can test.  Strength at the elbow and shoulder are 5 out of 5  Neurological: He is alert and oriented to person, place, and time.  Skin: Skin is dry. No pallor.  Psychiatric: He has a normal mood and affect. His behavior is normal.  Vitals reviewed.      Assessment & Plan:  GAD - GAD - 7 score of  6 and somewhat difficulty. PHQ-9 score of 9 and someone what difficulty.  Discussed options.  He really wants to hold off on medication and is feeling much better.  His gad 7 and PHQ 9 scores are significantly better than they were a few weeks ago.  I think it is reasonable to hold off.  If any point he feels like his symptoms are increasing or  persisting then he is welcome to start the medication.  If he does start it then  I would like to see him back about 3 to 4 weeks after starting it so that we can make any adjustments needed before he refills it.  Right shoulder pain -actually has good strength and range of motion on exam.  Just a little bit of discomfort with full extension but otherwise doing well.  We discussed options including returning back to physical therapy, continuing to work on his home exercises which she has fallen off on over the last couple weeks or may be even getting back in with Dr. Georgina Snell at some point, for further work-up.  He does feel like it is better than it was was just to try to do his home exercises for now.  MS-continued wound work-up with neurology.  We will keep my eye out for any additional test results.  Had a cervical spine film done on Monday.

## 2017-10-27 ENCOUNTER — Ambulatory Visit
Admission: RE | Admit: 2017-10-27 | Discharge: 2017-10-27 | Disposition: A | Discharge status: Home / Self Care | Payer: BLUE CROSS/BLUE SHIELD | Source: Ambulatory Visit | Attending: Neurology | Admitting: Neurology

## 2017-10-27 VITALS — BP 106/58 | HR 78

## 2017-10-27 DIAGNOSIS — R9089 Other abnormal findings on diagnostic imaging of central nervous system: Secondary | ICD-10-CM

## 2017-10-27 DIAGNOSIS — G35 Multiple sclerosis: Secondary | ICD-10-CM | POA: Diagnosis not present

## 2017-10-27 DIAGNOSIS — R413 Other amnesia: Secondary | ICD-10-CM

## 2017-10-27 DIAGNOSIS — G9389 Other specified disorders of brain: Secondary | ICD-10-CM | POA: Diagnosis not present

## 2017-10-27 DIAGNOSIS — R6889 Other general symptoms and signs: Secondary | ICD-10-CM

## 2017-10-27 DIAGNOSIS — R202 Paresthesia of skin: Secondary | ICD-10-CM | POA: Diagnosis not present

## 2017-10-27 DIAGNOSIS — R292 Abnormal reflex: Secondary | ICD-10-CM

## 2017-10-27 NOTE — Discharge Instructions (Signed)

## 2017-10-27 NOTE — Progress Notes (Signed)
Blood obtained from pt's L AC for LP labs. Pt tolerated procedure well. Site is unremarkable. 

## 2017-11-03 ENCOUNTER — Telehealth: Payer: Self-pay | Admitting: Neurology

## 2017-11-03 LAB — CSF CELL COUNT WITH DIFFERENTIAL
Basophils, %: 0 %
Lymphs, CSF: 92 % — ABNORMAL HIGH (ref 40–80)
MONOCYTE/MACROPHAGE: 8 % — AB (ref 15–45)
RBC COUNT CSF: 0 {cells}/uL (ref 0–10)
SEGMENTED NEUTROPHILS-CSF: 0 % (ref 0–6)
WBC CSF: 31 {cells}/uL — AB (ref 0–5)

## 2017-11-03 LAB — GLUCOSE, CSF: Glucose, CSF: 81 mg/dL — ABNORMAL HIGH (ref 40–80)

## 2017-11-03 LAB — PROTEIN, CSF: Total Protein, CSF: 119 mg/dL — ABNORMAL HIGH (ref 15–45)

## 2017-11-03 LAB — OLIGOCLONAL BANDS, CSF + SERM

## 2017-11-03 NOTE — Telephone Encounter (Signed)
Called patient and reviewed the lab findings from the CSF fluid. Based off the findings the CSF fluid his WBC and protein and glucose were elevated. Olicoclonals were negative. Pt was a little concerned because of the MRI result stating it looked consistent with MS. Advised the patient the gold standard diagnosis is looking oligoclonals in the CSF fluid. I did advise the patient think of any questions that he may have as he is scheduled next week for a follow up to talk with Dr Brett Fairy about these results. Pt verbalized understanding. Pt had no questions at this time but was encouraged to call back if questions arise. Pt was appreciative for calling and updating him

## 2017-11-03 NOTE — Telephone Encounter (Signed)
-----   Message from Larey Seat, MD sent at 11/03/2017 12:37 PM EDT ----- Glucose , protein and cells were all elevated. Oligoclonals were negative , unlikely to represent MS.

## 2017-11-11 ENCOUNTER — Telehealth: Payer: Self-pay | Admitting: Neurology

## 2017-11-11 ENCOUNTER — Ambulatory Visit (INDEPENDENT_AMBULATORY_CARE_PROVIDER_SITE_OTHER): Payer: BLUE CROSS/BLUE SHIELD | Admitting: Neurology

## 2017-11-11 ENCOUNTER — Encounter: Payer: Self-pay | Admitting: Neurology

## 2017-11-11 VITALS — BP 127/74 | HR 85 | Ht 71.0 in | Wt 171.5 lb

## 2017-11-11 DIAGNOSIS — R9089 Other abnormal findings on diagnostic imaging of central nervous system: Secondary | ICD-10-CM | POA: Diagnosis not present

## 2017-11-11 DIAGNOSIS — R9082 White matter disease, unspecified: Secondary | ICD-10-CM | POA: Insufficient documentation

## 2017-11-11 NOTE — Progress Notes (Signed)
Provider:  Larey Seat, M D  Referring Provider: Hali Marry, * Primary Care Physician:  Hali Marry, MD  Chief Complaint  Patient presents with  . Abnormal finding, MRI    rm 11, wife- [name], "reviewing MRI, LP results, no new concerns"  . Follow-up    HPI:  [name] is a 47 y.o. male and seen here with his wife on 11-11-2017, having had a second LP for CSF testing since 1999.  I have the pleasure of seeing Mr. and Mrs. [name] today following not just the MRI report that was obtained as a brain study through Dr. Madilyn Fireman disorders from 18 September 2017 but also on our cervical spine MRI which followed on 11 October 2017.  The study was read by my colleague Dr. Arlice Colt, it showed multiple small T2 hyperintensities within the spinal cord these are located centrally and appear at C2, C3 and C4 as well as at C6 level.  None of these appeared acute to him and they did not enhance with contrast, there was no significant spinal stenosis that bone spur development but it came to his bony spinal anatomy.  He had a broad disc protrusion between the fifth and sixth cervical vertebra,  and uncovertebral spurring.  There was a very small paramedian disc protrusion between the 6 and 7 vertebra - there was no foraminal narrowing narrowing, or nerve root compression. Overall he would probably benefit from physical exercises or physical therapy working on his cervical spine. His CSF was free of oligoclonals, when he had reportedly positive bands in 1999.    09-29-2017 in a referral  from Dr. Madilyn Fireman for an evalution of right body weakness,  Associated with a diagnosis of MS in 1999, that was later disputed.  [name] is a married 47 year old gentleman who distinctly remembers that his wife was pregnant at the time when he was first suspected to have symptoms of MS.  It was in 1999 and he had woken up noticing his right arm was numb and tingly.  The lack  of feeling did not correlate to weakness.  He was first evaluated by physical therapy, thinking he may have slept wrong position his arm the wrong way. He saw Dr Marijean Bravo In [facility].  Later he saw Dr. Ailene Ravel at [facility] for a second opinion- his diagnosis was a mild or new onset of multiple sclerosis.   He was treated with injectable medications, did not suffer any relapses or exacerbations, he never became in any way disabled, and all symptoms of dysesthesias or sensory loss resolved. He was exquisitely sensitive to heat and humidity, which was felt to be related to MS. Only once did he receive treatment by steroid drip, at the initial diagnosis.  The diagnosis was later revised when he transferred care closer to home and was seen at a private neurology practice in [place], Dr. Benjamine Sprague, MD  but not [facility]. This was over a decade ago and in the meantime he is done very well. He no longer took Rebif. In July of this year [name] developed right leg weakness as well as some neck pain radiating down to his right shoulder he went through with physical therapy had 6 sessions, and noted that the numbness did somewhat improve but it felt more like locking or clicking in the right shoulder with certain movements.  He continues having right-sided leg weakness.  Notices most when he stands on his  right leg.  Is also evident when descending stairs.  He needs to hold onto the handrail when he does so.  He is currently taking Flexeril, Zocor, Prinivil, aspirin, no other prescription medications were named.  He works as a Orthoptist, Teaching laboratory technician based - he relays a lot more on notes. - his wife mention insomnia.    Review of Systems: Out of a complete 14 system review, the patient complains of only the following symptoms, and all other reviewed systems are negative. Anxiety - right leg weakness. normal lumbar spine- abnormal memory.    Social History   Socioeconomic  History  . Marital status: Married    Spouse name: [name]  . Number of children: 2  . Years of education: Not on file  . Highest education level: Not on file  Occupational History  . Occupation: IT    Comment: works in [place]  . Financial resource strain: Not on file  . Food insecurity:    Worry: Not on file    Inability: Not on file  . Transportation needs:    Medical: Not on file    Non-medical: Not on file  Tobacco Use  . Smoking status: Former Smoker    Packs/day: 1.00    Types: Cigarettes    Last attempt to quit: 10/28/2011    Years since quitting: 6.0  . Smokeless tobacco: Never Used  Substance and Sexual Activity  . Alcohol use: No  . Drug use: No  . Sexual activity: Yes  Lifestyle  . Physical activity:    Days per week: Not on file    Minutes per session: Not on file  . Stress: Not on file  Relationships  . Social connections:    Talks on phone: Not on file    Gets together: Not on file    Attends religious service: Not on file    Active member of club or organization: Not on file    Attends meetings of clubs or organizations: Not on file    Relationship status: Not on file  . Intimate partner violence:    Fear of current or ex partner: Not on file    Emotionally abused: Not on file    Physically abused: Not on file    Forced sexual activity: Not on file  Other Topics Concern  . Not on file  Social History Narrative  . Not on file    Family History  Problem Relation Age of Onset  . Hypertension Mother   . Alcohol abuse Father   . Cancer Maternal Grandfather   . Diabetes Maternal Aunt     Past Medical History:  Diagnosis Date  . Diabetes mellitus without complication ([place])   . Hypertension   . Multiple sclerosis (HCC) 1999   Dr Trula Ore  . Nephrolithiasis    sees urology    Past Surgical History:  Procedure Laterality Date  . APPENDECTOMY  2003  . LITHOTRIPSY      Current Outpatient Medications  Medication Sig  Dispense Refill  . aspirin EC 81 MG tablet Take 81 mg by mouth daily.    . Canagliflozin-metFORMIN HCl ER (INVOKAMET XR) 50-1000 MG TB24 Take 1 tablet by mouth daily.    Marland Kitchen lisinopril (PRINIVIL,ZESTRIL) 5 MG tablet   2  . simvastatin (ZOCOR) 40 MG tablet TAKE 1 TABLET BY MOUTH DAILY AT 6:00 PM **NEED LABS DRAWN** (Patient taking differently: Take 40 mg by mouth daily at 6 PM. ) 15 tablet 0  . tadalafil (  CIALIS) 5 MG tablet Take 5 mg by mouth daily as needed for erectile dysfunction.    . tamsulosin (FLOMAX) 0.4 MG CAPS capsule Take 0.4 mg by mouth.    . cyclobenzaprine (FLEXERIL) 10 MG tablet Take 1 tablet (10 mg total) by mouth at bedtime as needed for muscle spasms. (Patient not taking: Reported on 11/11/2017) 30 tablet 0  . FLUoxetine (PROZAC) 20 MG tablet 1/2 tab po QD x 6 days, then increase to whole tab daily (Patient not taking: Reported on 10/14/2017) 30 tablet 1   No current facility-administered medications for this visit.     Allergies as of 11/11/2017  . (No Known Allergies)    Vitals: BP 127/74   Pulse 85   Ht 5\' 11"  (1.803 m)   Wt 171 lb 8 oz (77.8 kg)   BMI 23.92 kg/m  Last Weight:  Wt Readings from Last 1 Encounters:  11/11/17 171 lb 8 oz (77.8 kg)   Last Height:   Ht Readings from Last 1 Encounters:  11/11/17 5\' 11"  (1.803 m)    Physical exam:  General: The patient is awake, alert and appears not in acute distress. The patient is well groomed. Head: Normocephalic, atraumatic. Neck is supple. Mallampati 2, neck circumference 15 Cardiovascular:  Regular rate and rhythmRespiratory: Lungs are clear to auscultation. Skin:  Without evidence of edema, or rash Trunk: BMI is normal, as is  posture.  Neurologic exam : The patient is awake and alert, oriented to place and time.  Memory subjective described as impaired- short term and long term, he had trouble remembering the time and place some family pictures.  .  There is some distractibly. Speech is fluent without  dysarthria, dysphonia or aphasia. Mood and affect are slightly anxious.   Cranial nerves: Pupils are equal in size and bilaterally briskly reactive to light. The left pupil dilated while light exposed. Funduscopic exam without evidence of pallor or edema. Extraocular movements  in vertical and horizontal planes intact and without nystagmus.  Visual fields by finger perimetry are intact. Hearing to finger rub intact. Facial sensation intact to fine touch. Facial motor strength is symmetric and tongue and uvula move midline. Tongue protrusion into either cheek is normal. Shoulder shrug is normal.   Motor exam:   Normal tone ,muscle bulk and symmetric  strength in all extremities. Sensory:  Fine touch, pinprick and vibration were tested in all extremities. Proprioception was normal. Vibration preserved ( patient has diabetes )  Coordination: Rapid alternating movements in the fingers/hands were normal. Finger-to-nose maneuver  normal without evidence of ataxia, dysmetria or tremor.  Gait and station: Patient walks without assistive device and is able unassisted to climb up to the exam table.  He turns with 3 steps, was able to stand on tip toes and heels, appeared to have some foot drop - " walking flat footed" . Strength within normal limits !. Stance is stable and normal. Tandem gait is unfragmented. Romberg testing is negative   Deep tendon reflexes: very brisk - jumpy- in  upper and lower extremities . Babinski maneuver response is  downgoing.  Assessment:  After physical and neurologic examination, review of laboratory studies, imaging, neurophysiology testing and pre-existing records, assessment is that of : I am more concerned about the large ventricles- His MRI white matter disease is not exclusively diagnostic for MS, but his cervical spine lesions lean towards the diagnosis. The negative CSF was another discussion point. Marland Kitchen  His history is not necessarily typical for MS- I  have asked Dr. Felecia Shelling  to see him for assessment and treatment - if needed.   Referral to Dr. Felecia Shelling, patient was CSF negative for oligo-clonals.   Asencion Partridge Jae Skeet MD 11/11/2017

## 2017-11-11 NOTE — Telephone Encounter (Signed)
Dr. Brett Fairy has requested patient be scheduled for a 1 hour appt with Dr. Felecia Shelling. I called patient to schedule this follow-up and patient did not answer. I LVM asking for patient to call us back to schedule this.

## 2017-11-15 NOTE — Telephone Encounter (Signed)
Please leave your number with [name]. I am sure he wants an appointment before the end of the year. CD

## 2017-11-15 NOTE — Telephone Encounter (Signed)
Appears pt already scheduled with Dr. Felecia Shelling on 11/18/17 at [place].

## 2017-11-15 NOTE — Telephone Encounter (Signed)
Called, LVM for pt to call and schedule appt with Dr. Felecia Shelling. If he calls, can offer 11-25-17 at 9am (1hr appt), check in 830am.

## 2017-11-16 MED FILL — INVOKAMET XR 50-1,000 MG TA: 50-1000 | 30 days supply | Qty: 60 | Fill #1

## 2017-11-16 MED FILL — TAMSULOSIN HCL 0.4 MG CAP: 0.4 | 30 days supply | Qty: 30 | Fill #0

## 2017-11-18 ENCOUNTER — Ambulatory Visit (INDEPENDENT_AMBULATORY_CARE_PROVIDER_SITE_OTHER): Payer: BLUE CROSS/BLUE SHIELD | Admitting: Neurology

## 2017-11-18 ENCOUNTER — Encounter: Payer: Self-pay | Admitting: Neurology

## 2017-11-18 VITALS — BP 101/70 | HR 95 | Ht 71.0 in | Wt 175.5 lb

## 2017-11-18 DIAGNOSIS — Z79899 Other long term (current) drug therapy: Secondary | ICD-10-CM | POA: Insufficient documentation

## 2017-11-18 DIAGNOSIS — G35 Multiple sclerosis: Secondary | ICD-10-CM

## 2017-11-18 DIAGNOSIS — R6889 Other general symptoms and signs: Secondary | ICD-10-CM

## 2017-11-18 DIAGNOSIS — R3911 Hesitancy of micturition: Secondary | ICD-10-CM

## 2017-11-18 DIAGNOSIS — F09 Unspecified mental disorder due to known physiological condition: Secondary | ICD-10-CM | POA: Diagnosis not present

## 2017-11-18 DIAGNOSIS — R269 Unspecified abnormalities of gait and mobility: Secondary | ICD-10-CM | POA: Insufficient documentation

## 2017-11-18 DIAGNOSIS — E1121 Type 2 diabetes mellitus with diabetic nephropathy: Secondary | ICD-10-CM

## 2017-11-18 DIAGNOSIS — R799 Abnormal finding of blood chemistry, unspecified: Secondary | ICD-10-CM | POA: Diagnosis not present

## 2017-11-18 MED ORDER — AMPHETAMINE-DEXTROAMPHET ER 15 MG PO CP24: 15.0000 mg | ORAL_CAPSULE | ORAL | 0 refills | Status: DC

## 2017-11-18 MED FILL — ADDERALL XR 15 MG CAP SA: 15 | 30 days supply | Qty: 30 | Fill #0

## 2017-11-18 NOTE — Progress Notes (Signed)
Pl[facility] in [facility] for routine lab pick up.  

## 2017-11-18 NOTE — Progress Notes (Signed)
[facility]  PATIENT: [name] DOB: September 13, 1970  REFERRING DOCTOR OR PCP:  Asencion Partridge Dohmeier SOURCE: patient, notes from Dr. Brett Fairy, imaging/lab reports and MRI images were personally reviewed  _________________________________   HISTORICAL  CHIEF COMPLAINT:  Chief Complaint  Patient presents with  . New Patient (Initial Visit)    RM 13, alone. Last seen 11/11/17 by Dr. Brett Fairy. He was referred to Dr. Felecia Shelling for evaluation.    HISTORY OF PRESENT ILLNESS:  I had the pleasure of seeing your patient, [name], at the [place] center at [facility] for consultation regarding the possibility of MS and disease modifying therapy options  In 1999, he recalls having a GI virus and waking up the next day with numbness in his right arm.  He was referred to Dr. Marijean Bravo in [facility].   He had an LP showing elevated OCB and he was officially diagnosed with RRMS.    He started seeing Dr. Hart Robinsons at [facility] for a few years.  He was placed on Avonex and then Copaxone.   He tolerated the medications ok but did not note any improvements afterwards. He transferred care to Dr. Trula Ore who questioned the diagnosis of MS.   He stopped taking any DMT.   This July, on a hot day, he noticed tingling in his fingertips.  The next morning he woke up with right sided weakness and clumsiness in the right leg and numbness in the leg and arm.   He has not had much improvement since the onset of the new symptoms.  He saw Dr. Brett Fairy and had a repeat LP.  It showed OCB in the CSF and serum.   No change in walking afterwards.    Currently, straight line walking is ok but he stumbles with turns and upon his first few steps or on an uneven surface.     The right legg still feels mildly weak and clumsy.  Arms are fine.    He has urinary hesitancy and frequency.    He does fully empty by [place].  He also has an enlarged prostate.   Combination of Flomax and Cialis has  helped.    He has fatigue, worse in heat.   He has insomnia due t problems getting comfortable and difficulty falling back asleep.   He had depression and anxiety earlier this year but has since improved.   There is mild anxiety.   He works as a Naval architect fluency issues.  He has reduced focus attention with some processing issues and reduced short term memory at times.     He has well controlled Type 2 DM, first treated with insulin and now with Invokana after weight loss and diet changes.     I personally reviewed the MRI images of the brain and cervical spine from 09/18/2017.   The MRI of the brain shows multiple T2/FLAIR hyperintense foci, mostly in the periventricular white matter radially oriented to the ventricles.  There are no lesions in the brainstem or cerebellum.  One small focus in the left parietal lobe enhanced after contrast.  There is generalized atrophy but the ventricles appear to be enlarged, mildly out of proportion to the extent of atrophy.  The MRI of the cervical spine showed 5-6 T2 hyperintense foci, most of which were posterolateral and none enhancing.  CSF 10/27/2017 showed > 5 oligoclonal bands in CSF and additional bands in CSF and serum.   Protein was markedly elevated at 119.  Glucose slightly elevated (diabetic).  WBC elevated at 31 with 92% lymphs.   REVIEW OF SYSTEMS: Constitutional: No fevers, chills, sweats, or change in appetite.  He has fatigue.  There is some insomnia. Eyes: No visual changes, double vision, eye pain Ear, nose and throat: No hearing loss, ear pain, nasal congestion, sore throat Cardiovascular: No chest pain, palpitations Respiratory: No shortness of breath at rest or with exertion.   No wheezes GastrointestinaI: No nausea, vomiting, diarrhea, abdominal pain, fecal incontinence Genitourinary: No dysuria, urinary retention or frequency.  No nocturia. Musculoskeletal: No neck pain, back pain Integumentary: No rash,  pruritus, skin lesions Neurological: as above Psychiatric: No depression at this time though he had some a few months ago.  No anxiety Endocrine: No palpitations, diaphoresis, change in appetite, change in weigh or increased thirst Hematologic/Lymphatic: No anemia, purpura, petechiae. Allergic/Immunologic: No itchy/runny eyes, nasal congestion, recent allergic reactions, rashes  ALLERGIES: No Known Allergies  HOME MEDICATIONS:  Current Outpatient Medications:  .  aspirin EC 81 MG tablet, Take 81 mg by mouth daily., Disp: , Rfl:  .  Canagliflozin-metFORMIN HCl ER (INVOKAMET XR) 50-1000 MG TB24, Take 1 tablet by mouth daily., Disp: , Rfl:  .  lisinopril (PRINIVIL,ZESTRIL) 5 MG tablet, , Disp: , Rfl: 2 .  simvastatin (ZOCOR) 40 MG tablet, TAKE 1 TABLET BY MOUTH DAILY AT 6:00 PM **NEED LABS DRAWN** (Patient taking differently: Take 40 mg by mouth daily at 6 PM. ), Disp: 15 tablet, Rfl: 0 .  tadalafil (CIALIS) 5 MG tablet, Take 5 mg by mouth daily as needed for erectile dysfunction., Disp: , Rfl:  .  tamsulosin (FLOMAX) 0.4 MG CAPS capsule, Take 0.4 mg by mouth., Disp: , Rfl:   PAST MEDICAL HISTORY: Past Medical History:  Diagnosis Date  . Diabetes mellitus without complication ([place])   . Hypertension   . Multiple sclerosis (HCC) 1999   Dr Trula Ore  . Nephrolithiasis    sees urology    PAST SURGICAL HISTORY: Past Surgical History:  Procedure Laterality Date  . APPENDECTOMY  2003  . LITHOTRIPSY      FAMILY HISTORY: Family History  Problem Relation Age of Onset  . Hypertension Mother   . Alcohol abuse Father   . Cancer Maternal Grandfather   . Diabetes Maternal Aunt     SOCIAL HISTORY:  Social History   Socioeconomic History  . Marital status: Married    Spouse name: christy  . Number of children: 2  . Years of education: Not on file  . Highest education level: Not on file  Occupational History  . Occupation: IT    Comment: works in [place]  .  Financial resource strain: Not on file  . Food insecurity:    Worry: Not on file    Inability: Not on file  . Transportation needs:    Medical: Not on file    Non-medical: Not on file  Tobacco Use  . Smoking status: Former Smoker    Packs/day: 1.00    Types: Cigarettes    Last attempt to quit: 10/28/2011    Years since quitting: 6.0  . Smokeless tobacco: Never Used  Substance and Sexual Activity  . Alcohol use: Yes    Comment: sometimes   . Drug use: Yes    Types: Marijuana    Comment: Weekly  . Sexual activity: Yes  Lifestyle  . Physical activity:    Days per week: Not on file    Minutes per session: Not on file  .  Stress: Not on file  Relationships  . Social connections:    Talks on phone: Not on file    Gets together: Not on file    Attends religious service: Not on file    Active member of club or organization: Not on file    Attends meetings of clubs or organizations: Not on file    Relationship status: Not on file  . Intimate partner violence:    Fear of current or ex partner: Not on file    Emotionally abused: Not on file    Physically abused: Not on file    Forced sexual activity: Not on file  Other Topics Concern  . Not on file  Social History Narrative   Right handed    Caffeine use: Diet soda     PHYSICAL EXAM  Vitals:   11/18/17 0912  BP: 101/70  Pulse: 95  Weight: 175 lb 8 oz (79.6 kg)  Height: 5\' 11"  (1.803 m)    Body mass index is 24.48 kg/m.   General: The patient is well-developed and well-nourished and in no acute distress  Eyes:  Funduscopic exam shows normal optic discs and retinal vessels.  Neck: The neck is supple, no carotid bruits are noted.  The neck is nontender.  Cardiovascular: The heart has a regular rate and rhythm with a normal S1 and S2. There were no murmurs, gallops or rubs. .  Skin: Extremities are without significant edema.  Musculoskeletal:  Back is nontender  Neurologic Exam  Mental status: The patient is  alert and oriented x 3 at the time of the examination. The patient has apparent normal recent and remote memory, with an apparently normal attention span and concentration ability.   Speech is normal.  Cranial nerves: Extraocular movements are full. Pupils are equal, round, and reactive to light and accomodation.  Visual fields are full.  Facial symmetry is present. There is good facial sensation to soft touch bilaterally.Facial strength is normal.  Trapezius and sternocleidomastoid strength is normal. No dysarthria is noted.  The tongue is midline, and the patient has symmetric elevation of the soft palate. No obvious hearing deficits are noted.  Motor:  Muscle bulk is normal.  Muscle tone is increased in the right leg.  Strength was 5/5 except 4+/5 in the right EHL.  Sensory: Sensory testing shows reduced sensation to touch, temperature and vibration in the right leg relative to the left leg but normal sensation in the arms Coordination: Cerebellar testing reveals good finger-nose-finger and reduced right heel-to-shin .  Gait and station: Station is normal.   Gait is mildly wide and mildly spastic on the right.  The tandem gait is wide Romberg is negative.   Reflexes: Deep tendon reflexes are increased, legs more than arms.  There are cross abductor reflexes at the knees and nonsustained clonus at the ankles.   DIAGNOSTIC DATA (LABS, IMAGING, TESTING) - I reviewed patient records, labs, notes, testing and imaging myself where available.  Lab Results  Component Value Date   WBC 7.5 06/25/2011   HGB 15.5 06/25/2011   HCT 45.0 06/25/2011   MCV 86.9 06/25/2011   PLT 235 06/25/2011      Component Value Date/Time   NA 140 10/12/2017 0824   K 3.8 10/12/2017 0824   CL 102 10/12/2017 0824   CO2 25 10/12/2017 0824   GLUCOSE 157 (H) 10/12/2017 0824   BUN 13 10/12/2017 0824   CREATININE 0.71 10/12/2017 0824   CALCIUM 10.1 10/12/2017 0824   PROT  7.3 02/15/2014 0916   ALBUMIN 4.6 02/15/2014  0916   AST 14 02/15/2014 0916   ALT 22 02/15/2014 0916   ALKPHOS 65 02/15/2014 0916   BILITOT 0.3 02/15/2014 0916   GFRNONAA 108 09/16/2017 1056   GFRAA 125 09/16/2017 1056   Lab Results  Component Value Date   CHOL 121 10/12/2016   HDL 22 (A) 10/12/2016   LDLCALC 56 10/12/2016   LDLDIRECT 111 (H) 02/15/2014   TRIG 215 (A) 10/12/2016   CHOLHDL 6.3 02/15/2014   Lab Results  Component Value Date   HGBA1C 6.5 10/12/2016   No results found for: VITAMINB12 No results found for: TSH     ASSESSMENT AND PLAN  Multiple sclerosis ([place]) - Plan: Hepatitis B core antibody, total, Hepatitis B surface antigen, CBC with Differential/Platelet, Hepatitis B surface antibody,qualitative, Hepatitis C antibody, Hepatic function panel, QuantiFERON-TB Gold Plus, Stratify [facility] Antibody Test ([facility]), CBC with Differential/Platelet  Type 2 diabetes mellitus with diabetic nephropathy, without long-term current use of insulin (HCC)  Cognitive deficit secondary to multiple sclerosis (HCC)  Abnormal sensory exam  Urinary hesitancy  Gait disturbance  High risk medication use - Plan: Hepatitis B core antibody, total, Hepatitis B surface antigen, CBC with Differential/Platelet, Hepatitis B surface antibody,qualitative, Hepatitis C antibody, Hepatic function panel, QuantiFERON-TB Gold Plus, Stratify [facility]CV Antibody Test ([facility]), CBC with Differential/Platelet   In summary, [name] is a 47 year old man who we presented for possible MS this summer after having the onset of right-sided numbness weakness and clumsiness.    I reviewed multiple MRIs as well as his history over the years.  His history, physical and MRIs are all consistent with a diagnosis of multiple sclerosis.  An initial lumbar puncture had shown CSF consistent with MS.  His most recent CSF is mixed with some oligoclonal bands only present in the CSF and others present in the CSF and serum.   Putting all the data together, MS is by far the  most likely diagnosis.  Therefore, we need to start a disease modifying therapy.  He has multiple lesions in his spinal cord and has some difficulties with gait.  It is essential that we help him avoid getting more plaque in the spinal cord and therefore I recommend a highly effective disease modifying therapy.  We specifically discussed Mayzent, Ocrevus and Tysabri.  He is most interested in the IV therapies and we will check blood work and reviewed the results with him next week to help him choose.  He also has some symptoms including mild cognitive issues with reduced focus and attention.  I believe he would benefit from a stimulant and I will start him on a low-dose of Adderall XR 15 mg daily and increase if needed.  Thank you for asking me to see [name].  80 minutes face-to-face evaluation with greater than one half the time counseling and coordinating care about his diagnosis of MS, disease modifying therapy options, prognosis, cognitive issues.   Zavia Pullen A. Felecia Shelling, MD, [facility] 61/44/[id number], 0:08 AM Certified in Neurology, Clinical Neurophysiology, Sleep Medicine, Pain Medicine and Neuroimaging  [facility] [address] [place], [place] [address] [phone]

## 2017-11-22 LAB — CBC WITH DIFFERENTIAL/PLATELET
Basophils Absolute: 0.1 10*3/uL (ref 0.0–0.2)
Basos: 1 %
EOS (ABSOLUTE): 0.1 10*3/uL (ref 0.0–0.4)
EOS: 2 %
HEMATOCRIT: 46.8 % (ref 37.5–51.0)
HEMOGLOBIN: 15.7 g/dL (ref 13.0–17.7)
Immature Grans (Abs): 0.1 10*3/uL (ref 0.0–0.1)
Immature Granulocytes: 1 %
LYMPHS ABS: 2.1 10*3/uL (ref 0.7–3.1)
Lymphs: 39 %
MCH: 30 pg (ref 26.6–33.0)
MCHC: 33.5 g/dL (ref 31.5–35.7)
MCV: 90 fL (ref 79–97)
MONOCYTES: 10 %
Monocytes Absolute: 0.5 10*3/uL (ref 0.1–0.9)
NEUTROS ABS: 2.6 10*3/uL (ref 1.4–7.0)
Neutrophils: 47 %
Platelets: 249 10*3/uL (ref 150–450)
RBC: 5.23 x10E6/uL (ref 4.14–5.80)
RDW: 13.2 % (ref 12.3–15.4)
WBC: 5.4 10*3/uL (ref 3.4–10.8)

## 2017-11-22 LAB — HEPATIC FUNCTION PANEL
ALBUMIN: 5.2 g/dL (ref 3.5–5.5)
ALK PHOS: 65 IU/L (ref 39–117)
ALT: 16 IU/L (ref 0–44)
AST: 17 IU/L (ref 0–40)
BILIRUBIN TOTAL: 0.4 mg/dL (ref 0.0–1.2)
BILIRUBIN, DIRECT: 0.11 mg/dL (ref 0.00–0.40)
Total Protein: 7.5 g/dL (ref 6.0–8.5)

## 2017-11-22 LAB — QUANTIFERON-TB GOLD PLUS
QUANTIFERON TB1 AG VALUE: 0.03 [IU]/mL
QUANTIFERON-TB GOLD PLUS: NEGATIVE
QuantiFERON Nil Value: 0.02 IU/mL
QuantiFERON TB2 Ag Value: 0.02 IU/mL

## 2017-11-22 LAB — HEPATITIS B CORE ANTIBODY, TOTAL: Hep B Core Total Ab: NEGATIVE

## 2017-11-22 LAB — HEPATITIS B SURFACE ANTIGEN: Hepatitis B Surface Ag: NEGATIVE

## 2017-11-22 LAB — HEPATITIS C ANTIBODY: Hep C Virus Ab: <0.1 s/co ratio (ref 0.0–0.9)

## 2017-11-22 LAB — HEPATITIS B SURFACE ANTIBODY,QUALITATIVE: Hep B Surface Ab, Qual: NONREACTIVE

## 2017-11-26 ENCOUNTER — Telehealth: Payer: Self-pay | Admitting: *Deleted

## 2017-11-26 ENCOUNTER — Telehealth: Payer: Self-pay | Admitting: Neurology

## 2017-11-26 NOTE — Telephone Encounter (Signed)
Called [facility] at [phone] to get [facility] lab results that we have not received yet. Sent out on 11/18/17.  They are going to fax results to [phone].

## 2017-11-26 NOTE — Telephone Encounter (Signed)
I called and left a message.    I had wanted to discuss his lab work to get him on a disease modifying therapy for his MS.  Labs look good and he can go on Tysabri as he is JCV antibody negative.  If he prefers he could do Ocrevus as those labs were fine as well.  I will try to call him again later this afternoon and if we cannot reach him today we will try to talk to him next week.

## 2017-11-26 NOTE — Telephone Encounter (Signed)
JCV ab drawn on 11/18/17 in [facility] at 0.33.  Inhibition Assay is Negative/fim

## 2017-11-26 NOTE — Telephone Encounter (Signed)
Called again 2:36 and left message

## 2017-12-03 NOTE — Telephone Encounter (Signed)
Paperwork completed, signed by Dr. Felecia Shelling and provided to [facility].

## 2017-12-03 NOTE — Telephone Encounter (Signed)
He wishes to go on Tysabri.  We will send the paperwork in.

## 2017-12-10 ENCOUNTER — Ambulatory Visit: Payer: BLUE CROSS/BLUE SHIELD | Admitting: Neurology

## 2017-12-15 MED FILL — TAMSULOSIN HCL 0.4 MG CAP: 0.4 | 30 days supply | Qty: 30 | Fill #1

## 2017-12-20 ENCOUNTER — Other Ambulatory Visit: Payer: Self-pay | Admitting: Family Medicine

## 2017-12-21 ENCOUNTER — Other Ambulatory Visit: Payer: Self-pay | Admitting: Family Medicine

## 2017-12-21 MED FILL — SIMVASTATIN 40 MG TABLET: 40 | 90 days supply | Qty: 90 | Fill #0

## 2017-12-21 MED FILL — INVOKAMET XR 50-1,000 MG TA: 50-1000 | 30 days supply | Qty: 60 | Fill #2

## 2017-12-22 MED FILL — CONTOUR NEXT STRIPS: 50 days supply | Qty: 50 | Fill #0

## 2017-12-28 MED FILL — LISINOPRIL 5 MG TABLET: 5 | 90 days supply | Qty: 90 | Fill #1

## 2018-01-24 MED FILL — INVOKAMET XR 50-1,000 MG TA: 50-1000 | 30 days supply | Qty: 60 | Fill #3

## 2018-01-25 MED FILL — TAMSULOSIN HCL 0.4 MG CAP: 0.4 | 30 days supply | Qty: 30 | Fill #0

## 2018-02-14 ENCOUNTER — Telehealth: Payer: Self-pay | Admitting: Neurology

## 2018-02-14 NOTE — Telephone Encounter (Signed)
pt has called for the [facility] suite, call transferred °

## 2018-02-22 ENCOUNTER — Telehealth: Payer: Self-pay | Admitting: *Deleted

## 2018-02-22 DIAGNOSIS — G35 Multiple sclerosis: Secondary | ICD-10-CM | POA: Diagnosis not present

## 2018-02-22 NOTE — Telephone Encounter (Signed)
Received fax notification from touch prescribing program that pt authorized from 02/22/18-08/23/18. Enrollment #: [id number]. Account: [employer]. Sit auth#: [id number].

## 2018-02-28 MED FILL — TAMSULOSIN HCL 0.4 MG CAP: 0.4 | 30 days supply | Qty: 30 | Fill #1

## 2018-02-28 MED FILL — INVOKAMET XR 50-1,000 MG TA: 50-1000 | 30 days supply | Qty: 60 | Fill #4

## 2018-03-10 ENCOUNTER — Ambulatory Visit (INDEPENDENT_AMBULATORY_CARE_PROVIDER_SITE_OTHER): Payer: BLUE CROSS/BLUE SHIELD | Admitting: Neurology

## 2018-03-10 VITALS — BP 140/84 | HR 85 | Ht 71.0 in | Wt 177.5 lb

## 2018-03-10 DIAGNOSIS — G35 Multiple sclerosis: Secondary | ICD-10-CM

## 2018-03-10 DIAGNOSIS — F09 Unspecified mental disorder due to known physiological condition: Secondary | ICD-10-CM

## 2018-03-10 DIAGNOSIS — G9389 Other specified disorders of brain: Secondary | ICD-10-CM | POA: Diagnosis not present

## 2018-03-10 DIAGNOSIS — Z79899 Other long term (current) drug therapy: Secondary | ICD-10-CM

## 2018-03-10 MED ORDER — PREDNISONE 50 MG PO TABS: ORAL_TABLET | ORAL | 0 refills | Status: DC

## 2018-03-10 MED FILL — predniSONE 50 MG TABS: 50 | 9 days supply | Qty: 52 | Fill #0

## 2018-03-10 NOTE — Progress Notes (Signed)
Faxed printed/signed rx prednisone to [facility] at [phone]. Received fax confirmation.

## 2018-03-10 NOTE — Progress Notes (Signed)
[facility]  PATIENT: [name] DOB: 07-23-1970  REFERRING DOCTOR OR PCP:  Asencion Partridge Dohmeier SOURCE: patient, notes from Dr. Brett Fairy, imaging/lab reports and MRI images were personally reviewed  _________________________________   HISTORICAL  CHIEF COMPLAINT:  Chief Complaint  Patient presents with  . Follow-up    RM 12 with wife. Last seen 11/18/17. Feels he has more numbness on right side/weakness. Also now having some on the left side which is new for him. This started last week, Thursday. Feels Adderall may have caused headache when he took this.    HISTORY OF PRESENT ILLNESS:  [name] is a 48 y.o. man who was diagnosed with MS in 1999.     Update 03/10/2018: He is on Tysabri with his first dose a few weeks ago.  He had no issues with the infusion.      He started to notice left hand numbness that started last Thursday.   The left leg also felt slightly weaker the last week.   He also noted more trouble with his balance last week.   He still has more numbness and weakness on his right.      He notes more difficulty with bladder and bowel urgency.    He also has an enlarged prostate and also has some hesitancy.      Cognitive issues seem worse over the past year.    From 11/18/2017: I had the pleasure of seeing your patient, [name], at the [place] [facility] at [facility] for consultation regarding the possibility of MS and disease modifying therapy options  In 1999, he recalls having a GI virus and waking up the next day with numbness in his right arm.  He was referred to Dr. Marijean Bravo in [facility].   He had an LP showing elevated OCB and he was officially diagnosed with RRMS.    He started seeing Dr. Hart Robinsons at [facility] for a few years.  He was placed on Avonex and then Copaxone.   He tolerated the medications ok but did not note any improvements afterwards. He transferred care to Dr. Trula Ore who questioned the diagnosis of  MS.   He stopped taking any DMT.   This July, on a hot day, he noticed tingling in his fingertips.  The next morning he woke up with right sided weakness and clumsiness in the right leg and numbness in the leg and arm.   He has not had much improvement since the onset of the new symptoms.  He saw Dr. Brett Fairy and had a repeat LP.  It showed OCB in the CSF and serum.   No change in walking afterwards.    Currently, straight line walking is ok but he stumbles with turns and upon his first few steps or on an uneven surface.     The right legg still feels mildly weak and clumsy.  Arms are fine.    He has urinary hesitancy and frequency.    He does fully empty by [place].  He also has an enlarged prostate.   Combination of Flomax and Cialis has helped.    He has fatigue, worse in heat.   He has insomnia due t problems getting comfortable and difficulty falling back asleep.   He had depression and anxiety earlier this year but has since improved.   There is mild anxiety.   He works as a Naval architect fluency issues.  He has reduced focus attention with some processing issues and reduced  short term memory at times.     He has well controlled Type 2 DM, first treated with insulin and now with Invokana after weight loss and diet changes.     I personally reviewed the MRI images of the brain and cervical spine from 09/18/2017.   The MRI of the brain shows multiple T2/FLAIR hyperintense foci, mostly in the periventricular white matter radially oriented to the ventricles.  There are no lesions in the brainstem or cerebellum.  One small focus in the left parietal lobe enhanced after contrast.  There is generalized atrophy but the ventricles appear to be enlarged, mildly out of proportion to the extent of atrophy.  The MRI of the cervical spine showed 5-6 T2 hyperintense foci, most of which were posterolateral and none enhancing.  CSF 10/27/2017 showed > 5 oligoclonal bands in CSF and additional  bands in CSF and serum.   Protein was markedly elevated at 119.   Glucose slightly elevated (diabetic).  WBC elevated at 31 with 92% lymphs.   REVIEW OF SYSTEMS: Constitutional: No fevers, chills, sweats, or change in appetite.  He has fatigue.  There is some insomnia. Eyes: No visual changes, double vision, eye pain Ear, nose and throat: No hearing loss, ear pain, nasal congestion, sore throat Cardiovascular: No chest pain, palpitations Respiratory: No shortness of breath at rest or with exertion.   No wheezes GastrointestinaI: No nausea, vomiting, diarrhea, abdominal pain, fecal incontinence Genitourinary: No dysuria, urinary retention or frequency.  No nocturia. Musculoskeletal: No neck pain, back pain Integumentary: No rash, pruritus, skin lesions Neurological: as above Psychiatric: No depression at this time though he had some a few months ago.  No anxiety Endocrine: No palpitations, diaphoresis, change in appetite, change in weigh or increased thirst Hematologic/Lymphatic: No anemia, purpura, petechiae. Allergic/Immunologic: No itchy/runny eyes, nasal congestion, recent allergic reactions, rashes  ALLERGIES: No Known Allergies  HOME MEDICATIONS:  Current Outpatient Medications:  .  aspirin EC 81 MG tablet, Take 81 mg by mouth daily., Disp: , Rfl:  .  Canagliflozin-metFORMIN HCl ER (INVOKAMET XR) 50-1000 MG TB24, Take 1 tablet by mouth daily., Disp: , Rfl:  .  lisinopril (PRINIVIL,ZESTRIL) 5 MG tablet, , Disp: , Rfl: 2 .  natalizumab (TYSABRI) 300 MG/15ML injection, Inject 300 mg into the vein every 30 (thirty) days. Start form provided to [facility] on 12/06/17., Disp: , Rfl:  .  simvastatin (ZOCOR) 40 MG tablet, TAKE 1 TABLET BY MOUTH DAILY AT 6:00 PM **NEED LABS DRAWN** (Patient taking differently: Take 40 mg by mouth daily at 6 PM. ), Disp: 15 tablet, Rfl: 0 .  tadalafil (CIALIS) 5 MG tablet, Take 5 mg by mouth daily as needed for erectile dysfunction., Disp: , Rfl:  .   tamsulosin (FLOMAX) 0.4 MG CAPS capsule, Take 0.4 mg by mouth., Disp: , Rfl:  .  amphetamine-dextroamphetamine (ADDERALL XR) 15 MG 24 hr capsule, Take 1 capsule by mouth every morning. (Patient not taking: Reported on 03/10/2018), Disp: 30 capsule, Rfl: 0 .  predniSONE (DELTASONE) 50 MG tablet, 10 po x 3 day (500 g) then 8 po x 1d then 6 po x 1d, then 4 po x 1d, then 2 po x1d, then 1 po x 1d, then 1/2 po x 1d.    HIGH DOSE ORAL TAPER FOR MS EXACERBATION., Disp: 52 tablet, Rfl: 0  PAST MEDICAL HISTORY: Past Medical History:  Diagnosis Date  . Diabetes mellitus without complication ([place])   . Hypertension   . Multiple sclerosis (HCC) 1999   Dr  Runheim  . Nephrolithiasis    sees urology    PAST SURGICAL HISTORY: Past Surgical History:  Procedure Laterality Date  . APPENDECTOMY  2003  . LITHOTRIPSY      FAMILY HISTORY: Family History  Problem Relation Age of Onset  . Hypertension Mother   . Alcohol abuse Father   . Cancer Maternal Grandfather   . Diabetes Maternal Aunt     SOCIAL HISTORY:  Social History   Socioeconomic History  . Marital status: Married    Spouse name: christy  . Number of children: 2  . Years of education: Not on file  . Highest education level: Not on file  Occupational History  . Occupation: IT    Comment: works in [place]  . Financial resource strain: Not on file  . Food insecurity:    Worry: Not on file    Inability: Not on file  . Transportation needs:    Medical: Not on file    Non-medical: Not on file  Tobacco Use  . Smoking status: Former Smoker    Packs/day: 1.00    Types: Cigarettes    Last attempt to quit: 10/28/2011    Years since quitting: 6.3  . Smokeless tobacco: Never Used  Substance and Sexual Activity  . Alcohol use: Yes    Comment: sometimes   . Drug use: Yes    Types: Marijuana    Comment: Weekly  . Sexual activity: Yes  Lifestyle  . Physical activity:    Days per week: Not on file    Minutes per  session: Not on file  . Stress: Not on file  Relationships  . Social connections:    Talks on phone: Not on file    Gets together: Not on file    Attends religious service: Not on file    Active member of club or organization: Not on file    Attends meetings of clubs or organizations: Not on file    Relationship status: Not on file  . Intimate partner violence:    Fear of current or ex partner: Not on file    Emotionally abused: Not on file    Physically abused: Not on file    Forced sexual activity: Not on file  Other Topics Concern  . Not on file  Social History Narrative   Right handed    Caffeine use: Diet soda     PHYSICAL EXAM  Vitals:   03/10/18 1124  BP: 140/84  Pulse: 85  Weight: 177 lb 8 oz (80.5 kg)  Height: 5\' 11"  (1.803 m)    Body mass index is 24.76 kg/m.   General: The patient is well-developed and well-nourished and in no acute distress   Skin: Extremities are without rash or edema.   Neurologic Exam  Mental status: The patient is alert and oriented x 3 at the time of the examination. The patient has apparent normal recent and remote memory, with an apparently normal attention span and concentration ability.   Speech is normal.  Cranial nerves: Extraocular movements are full.  Color vision is symmetric.  Facial strength and sensation was normal.  Trapezius strength was normal. No obvious hearing deficits are noted.  Motor:  Muscle bulk is normal.  Muscle tone is increased in the right leg.  Strength was 5/5 except 4+/5 in the right EHL.  Sensory: Sensory testing shows reduced sensation to temperature in the left leg but sytmmtric touch and vibration (was worse on right last time)  Coordination: Cerebellar testing reveals good finger-nose-finger and slightly right heel-to-shin .  Gait and station: Station is normal.   Gait is mildly wide and mildly spastic on the right.  The tandem gait is mildly wide but he can do.  Romberg is negative.    Reflexes: Deep tendon reflexes are increased, legs more than arms.  There are cross abductor reflexes at the knees and nonsustained clonus at the ankles.    DIAGNOSTIC DATA (LABS, IMAGING, TESTING) - I reviewed patient records, labs, notes, testing and imaging myself where available.  Lab Results  Component Value Date   WBC 5.4 11/18/2017   HGB 15.7 11/18/2017   HCT 46.8 11/18/2017   MCV 90 11/18/2017   PLT 249 11/18/2017      Component Value Date/Time   NA 140 10/12/2017 0824   K 3.8 10/12/2017 0824   CL 102 10/12/2017 0824   CO2 25 10/12/2017 0824   GLUCOSE 157 (H) 10/12/2017 0824   BUN 13 10/12/2017 0824   CREATININE 0.71 10/12/2017 0824   CALCIUM 10.1 10/12/2017 0824   PROT 7.5 11/18/2017 1023   ALBUMIN 5.2 11/18/2017 1023   AST 17 11/18/2017 1023   ALT 16 11/18/2017 1023   ALKPHOS 65 11/18/2017 1023   BILITOT 0.4 11/18/2017 1023   GFRNONAA 108 09/16/2017 1056   GFRAA 125 09/16/2017 1056   Lab Results  Component Value Date   CHOL 121 10/12/2016   HDL 22 (A) 10/12/2016   LDLCALC 56 10/12/2016   LDLDIRECT 111 (H) 02/15/2014   TRIG 215 (A) 10/12/2016   CHOLHDL 6.3 02/15/2014   Lab Results  Component Value Date   HGBA1C 6.5 10/12/2016       ASSESSMENT AND PLAN  Multiple sclerosis ([place])  Cerebral ventriculomegaly  Cognitive deficit secondary to multiple sclerosis (HCC)  High risk medication use  1.   Continue Tysabri 2.  He appears to be having a small sensory exacerbation.  Since symptoms are not rapidly improving I will have him do a high dose oral taper.  He has type 2 diabetes and is on oral medication.  He is advised to take his glucose readings more regularly and use his prn insulin if necessary.  If the sugars are not able to get under control he should stop the steroid 3.   He will return to see me in 3 months or sooner if there are new or worsening neurologic symptoms.  Richard A. Felecia Shelling, MD, [facility] 5/63/1497, 0:26 PM Certified in  Neurology, Clinical Neurophysiology, Sleep Medicine, Pain Medicine and Neuroimaging  [facility] [address], [place], [place] [address] [phone]

## 2018-03-22 DIAGNOSIS — G35 Multiple sclerosis: Secondary | ICD-10-CM | POA: Diagnosis not present

## 2018-03-31 MED FILL — LISINOPRIL 5 MG TABLET: 5 | 90 days supply | Qty: 90 | Fill #0

## 2018-03-31 MED FILL — TAMSULOSIN HCL 0.4 MG CAP: 0.4 | 30 days supply | Qty: 30 | Fill #2

## 2018-03-31 MED FILL — INVOKAMET XR 50-1,000 MG TA: 50-1000 | 30 days supply | Qty: 60 | Fill #5

## 2018-04-28 MED FILL — INVOKAMET XR 50-1,000 MG TA: 50-1000 | 30 days supply | Qty: 60 | Fill #0

## 2018-04-28 MED FILL — SIMVASTATIN 40 MG TABLET: 40 | 90 days supply | Qty: 90 | Fill #1

## 2018-04-28 MED FILL — TAMSULOSIN HCL 0.4 MG CAP: 0.4 | 30 days supply | Qty: 30 | Fill #3

## 2018-05-03 DIAGNOSIS — G35 Multiple sclerosis: Secondary | ICD-10-CM | POA: Diagnosis not present

## 2018-06-01 DIAGNOSIS — G35 Multiple sclerosis: Secondary | ICD-10-CM | POA: Diagnosis not present

## 2018-06-03 MED FILL — INVOKAMET XR 50-1,000 MG TA: 50-1000 | 30 days supply | Qty: 60 | Fill #1

## 2018-06-03 MED FILL — FLUOXETINE HCL 20 MG TABS: 20 | 30 days supply | Qty: 30 | Fill #1

## 2018-06-09 ENCOUNTER — Telehealth: Payer: Self-pay | Admitting: Family Medicine

## 2018-06-09 NOTE — Telephone Encounter (Signed)
Left voicemail for patient to call us back if they still needed an appointment. Spouse had called and stated that two voicemails were left.

## 2018-06-29 DIAGNOSIS — G35 Multiple sclerosis: Secondary | ICD-10-CM | POA: Diagnosis not present

## 2018-06-30 MED FILL — INVOKAMET XR 50-1,000 MG TA: 50-1000 | 30 days supply | Qty: 60 | Fill #2

## 2018-06-30 MED FILL — TAMSULOSIN HCL 0.4 MG CAP: 0.4 | 30 days supply | Qty: 30 | Fill #0

## 2018-07-11 ENCOUNTER — Ambulatory Visit: Payer: BLUE CROSS/BLUE SHIELD | Admitting: Neurology

## 2018-07-18 ENCOUNTER — Ambulatory Visit: Payer: BLUE CROSS/BLUE SHIELD | Admitting: Neurology

## 2018-07-18 NOTE — Progress Notes (Deleted)
[facility]  PATIENT: [name] DOB: 1970/04/09  REFERRING DOCTOR OR PCP:  Asencion Partridge Dohmeier SOURCE: patient, notes from Dr. Brett Fairy, imaging/lab reports and MRI images were personally reviewed  _________________________________   HISTORICAL  CHIEF COMPLAINT:  No chief complaint on file.   HISTORY OF PRESENT ILLNESS:  [name] is a 48 y.o. man who was diagnosed with MS in 1999.     Update 03/10/2018: He is on Tysabri with his first dose a few weeks ago.  He had no issues with the infusion.      He started to notice left hand numbness that started last Thursday.   The left leg also felt slightly weaker the last week.   He also noted more trouble with his balance last week.   He still has more numbness and weakness on his right.      He notes more difficulty with bladder and bowel urgency.    He also has an enlarged prostate and also has some hesitancy.      Cognitive issues seem worse over the past year.    From 11/18/2017: I had the pleasure of seeing your patient, [name], at the [place] center at [facility] for consultation regarding the possibility of MS and disease modifying therapy options  In 1999, he recalls having a GI virus and waking up the next day with numbness in his right arm.  He was referred to Dr. Marijean Bravo in [facility].   He had an LP showing elevated OCB and he was officially diagnosed with RRMS.    He started seeing Dr. Hart Robinsons at [facility] for a few years.  He was placed on Avonex and then Copaxone.   He tolerated the medications ok but did not note any improvements afterwards. He transferred care to Dr. Trula Ore who questioned the diagnosis of MS.   He stopped taking any DMT.   This July, on a hot day, he noticed tingling in his fingertips.  The next morning he woke up with right sided weakness and clumsiness in the right leg and numbness in the leg and arm.   He has not had much improvement since the onset of  the new symptoms.  He saw Dr. Brett Fairy and had a repeat LP.  It showed OCB in the CSF and serum.   No change in walking afterwards.    Currently, straight line walking is ok but he stumbles with turns and upon his first few steps or on an uneven surface.     The right legg still feels mildly weak and clumsy.  Arms are fine.    He has urinary hesitancy and frequency.    He does fully empty by [place].  He also has an enlarged prostate.   Combination of Flomax and Cialis has helped.    He has fatigue, worse in heat.   He has insomnia due t problems getting comfortable and difficulty falling back asleep.   He had depression and anxiety earlier this year but has since improved.   There is mild anxiety.   He works as a Naval architect fluency issues.  He has reduced focus attention with some processing issues and reduced short term memory at times.     He has well controlled Type 2 DM, first treated with insulin and now with Invokana after weight loss and diet changes.     I personally reviewed the MRI images of the brain and cervical spine from 09/18/2017.   The  MRI of the brain shows multiple T2/FLAIR hyperintense foci, mostly in the periventricular white matter radially oriented to the ventricles.  There are no lesions in the brainstem or cerebellum.  One small focus in the left parietal lobe enhanced after contrast.  There is generalized atrophy but the ventricles appear to be enlarged, mildly out of proportion to the extent of atrophy.  The MRI of the cervical spine showed 5-6 T2 hyperintense foci, most of which were posterolateral and none enhancing.  CSF 10/27/2017 showed > 5 oligoclonal bands in CSF and additional bands in CSF and serum.   Protein was markedly elevated at 119.   Glucose slightly elevated (diabetic).  WBC elevated at 31 with 92% lymphs.   REVIEW OF SYSTEMS: Constitutional: No fevers, chills, sweats, or change in appetite.  He has fatigue.  There is some  insomnia. Eyes: No visual changes, double vision, eye pain Ear, nose and throat: No hearing loss, ear pain, nasal congestion, sore throat Cardiovascular: No chest pain, palpitations Respiratory: No shortness of breath at rest or with exertion.   No wheezes GastrointestinaI: No nausea, vomiting, diarrhea, abdominal pain, fecal incontinence Genitourinary: No dysuria, urinary retention or frequency.  No nocturia. Musculoskeletal: No neck pain, back pain Integumentary: No rash, pruritus, skin lesions Neurological: as above Psychiatric: No depression at this time though he had some a few months ago.  No anxiety Endocrine: No palpitations, diaphoresis, change in appetite, change in weigh or increased thirst Hematologic/Lymphatic: No anemia, purpura, petechiae. Allergic/Immunologic: No itchy/runny eyes, nasal congestion, recent allergic reactions, rashes  ALLERGIES: No Known Allergies  HOME MEDICATIONS:  Current Outpatient Medications:    amphetamine-dextroamphetamine (ADDERALL XR) 15 MG 24 hr capsule, Take 1 capsule by mouth every morning. (Patient not taking: Reported on 03/10/2018), Disp: 30 capsule, Rfl: 0   aspirin EC 81 MG tablet, Take 81 mg by mouth daily., Disp: , Rfl:    Canagliflozin-metFORMIN HCl ER (INVOKAMET XR) 50-1000 MG TB24, Take 1 tablet by mouth daily., Disp: , Rfl:    lisinopril (PRINIVIL,ZESTRIL) 5 MG tablet, , Disp: , Rfl: 2   natalizumab (TYSABRI) 300 MG/15ML injection, Inject 300 mg into the vein every 30 (thirty) days. Start form provided to [facility] on 12/06/17., Disp: , Rfl:    predniSONE (DELTASONE) 50 MG tablet, 10 po x 3 day (500 g) then 8 po x 1d then 6 po x 1d, then 4 po x 1d, then 2 po x1d, then 1 po x 1d, then 1/2 po x 1d.    HIGH DOSE ORAL TAPER FOR MS EXACERBATION., Disp: 52 tablet, Rfl: 0   simvastatin (ZOCOR) 40 MG tablet, TAKE 1 TABLET BY MOUTH DAILY AT 6:00 PM **NEED LABS DRAWN** (Patient taking differently: Take 40 mg by mouth daily at 6 PM.  ), Disp: 15 tablet, Rfl: 0   tadalafil (CIALIS) 5 MG tablet, Take 5 mg by mouth daily as needed for erectile dysfunction., Disp: , Rfl:    tamsulosin (FLOMAX) 0.4 MG CAPS capsule, Take 0.4 mg by mouth., Disp: , Rfl:   PAST MEDICAL HISTORY: Past Medical History:  Diagnosis Date   Diabetes mellitus without complication ([place])    Hypertension    Multiple sclerosis (HCC) 1999   Dr Trula Ore   Nephrolithiasis    sees urology    PAST SURGICAL HISTORY: Past Surgical History:  Procedure Laterality Date   APPENDECTOMY  2003   LITHOTRIPSY      FAMILY HISTORY: Family History  Problem Relation Age of Onset   Hypertension Mother  Alcohol abuse Father    Cancer Maternal Grandfather    Diabetes Maternal Aunt     SOCIAL HISTORY:  Social History   Socioeconomic History   Marital status: Married    Spouse name: [name]   Number of children: 2   Years of education: Not on file   Highest education level: Not on file  Occupational History   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: Not on file   Food insecurity    Worry: Not on file    Inability: Not on file   Transportation needs    Medical: Not on file    Non-medical: Not on file  Tobacco Use   Smoking status: Former Smoker    Packs/day: 1.00    Types: Cigarettes    Quit date: 10/28/2011    Years since quitting: 6.7   Smokeless tobacco: Never Used  Substance and Sexual Activity   Alcohol use: Yes    Comment: sometimes    Drug use: Yes    Types: Marijuana    Comment: Weekly   Sexual activity: Yes  Lifestyle   Physical activity    Days per week: Not on file    Minutes per session: Not on file   Stress: Not on file  Relationships   Social connections    Talks on phone: Not on file    Gets together: Not on file    Attends religious service: Not on file    Active member of club or organization: Not on file    Attends meetings of clubs or  organizations: Not on file    Relationship status: Not on file   Intimate partner violence    Fear of current or ex partner: Not on file    Emotionally abused: Not on file    Physically abused: Not on file    Forced sexual activity: Not on file  Other Topics Concern   Not on file  Social History Narrative   Right handed    Caffeine use: Diet soda     PHYSICAL EXAM  There were no vitals filed for this visit.  There is no height or weight on file to calculate BMI.   General: The patient is well-developed and well-nourished and in no acute distress   Skin: Extremities are without rash or edema.   Neurologic Exam  Mental status: The patient is alert and oriented x 3 at the time of the examination. The patient has apparent normal recent and remote memory, with an apparently normal attention span and concentration ability.   Speech is normal.  Cranial nerves: Extraocular movements are full.  Color vision is symmetric.  Facial strength and sensation was normal.  Trapezius strength was normal. No obvious hearing deficits are noted.  Motor:  Muscle bulk is normal.  Muscle tone is increased in the right leg.  Strength was 5/5 except 4+/5 in the right EHL.  Sensory: Sensory testing shows reduced sensation to temperature in the left leg but sytmmtric touch and vibration (was worse on right last time)  Coordination: Cerebellar testing reveals good finger-nose-finger and slightly right heel-to-shin .  Gait and station: Station is normal.   Gait is mildly wide and mildly spastic on the right.  The tandem gait is mildly wide but he can do.  Romberg is negative.   Reflexes: Deep tendon reflexes are increased, legs more than arms.  There are cross abductor reflexes at the knees and  nonsustained clonus at the ankles.    DIAGNOSTIC DATA (LABS, IMAGING, TESTING) - I reviewed patient records, labs, notes, testing and imaging myself where available.  Lab Results  Component Value Date    WBC 5.4 11/18/2017   HGB 15.7 11/18/2017   HCT 46.8 11/18/2017   MCV 90 11/18/2017   PLT 249 11/18/2017      Component Value Date/Time   NA 140 10/12/2017 0824   K 3.8 10/12/2017 0824   CL 102 10/12/2017 0824   CO2 25 10/12/2017 0824   GLUCOSE 157 (H) 10/12/2017 0824   BUN 13 10/12/2017 0824   CREATININE 0.71 10/12/2017 0824   CALCIUM 10.1 10/12/2017 0824   PROT 7.5 11/18/2017 1023   ALBUMIN 5.2 11/18/2017 1023   AST 17 11/18/2017 1023   ALT 16 11/18/2017 1023   ALKPHOS 65 11/18/2017 1023   BILITOT 0.4 11/18/2017 1023   GFRNONAA 108 09/16/2017 1056   GFRAA 125 09/16/2017 1056   Lab Results  Component Value Date   CHOL 121 10/12/2016   HDL 22 (A) 10/12/2016   LDLCALC 56 10/12/2016   LDLDIRECT 111 (H) 02/15/2014   TRIG 215 (A) 10/12/2016   CHOLHDL 6.3 02/15/2014   Lab Results  Component Value Date   HGBA1C 6.5 10/12/2016       ASSESSMENT AND PLAN  No diagnosis found.  1.   Continue Tysabri 2.  He appears to be having a small sensory exacerbation.  Since symptoms are not rapidly improving I will have him do a high dose oral taper.  He has type 2 diabetes and is on oral medication.  He is advised to take his glucose readings more regularly and use his prn insulin if necessary.  If the sugars are not able to get under control he should stop the steroid 3.   He will return to see me in 3 months or sooner if there are new or worsening neurologic symptoms.  Marisol Giambra A. Felecia Shelling, MD, [facility] 2/53/6644, 0:34 PM Certified in Neurology, Clinical Neurophysiology, Sleep Medicine, Pain Medicine and Neuroimaging  [facility] [address], [place], [place] [address] [phone]

## 2018-07-25 ENCOUNTER — Telehealth: Payer: Self-pay | Admitting: *Deleted

## 2018-07-25 NOTE — Telephone Encounter (Signed)
Called, LVM for pt to call office to schedule f/u. He no showed on 07/18/18 and needing follow up asap.

## 2018-07-25 NOTE — Telephone Encounter (Signed)
Faxed completed/signed Tysabri pt status report and reauth questionnaire to [facility] touch at [phone]. Received confirmation.  

## 2018-07-25 NOTE — Telephone Encounter (Signed)
Received fax notification from touch prescribing program that pt re-auth for Tysabri from 07/25/18-02/22/19. Patient enrollment number: [id number]. Account: [facility]. Site auth number: [id number].

## 2018-07-26 ENCOUNTER — Other Ambulatory Visit: Payer: Self-pay | Admitting: *Deleted

## 2018-07-26 DIAGNOSIS — Z79899 Other long term (current) drug therapy: Secondary | ICD-10-CM

## 2018-07-26 DIAGNOSIS — G35 Multiple sclerosis: Secondary | ICD-10-CM

## 2018-07-26 NOTE — Telephone Encounter (Signed)
Pt returned call and r/s on the next available which was for 08/31/18.

## 2018-07-26 NOTE — Telephone Encounter (Signed)
Called pt to offer appt tomorrow at 1pm with Dr. Felecia Shelling since someone cx. He already has Tysabri infusion tomorrow. I scheduled f/u for 08/02/18 at [facility] he needs labs while he is here tomorrow for infusion. I placed future lab orders.

## 2018-07-27 ENCOUNTER — Telehealth: Payer: Self-pay | Admitting: *Deleted

## 2018-07-27 ENCOUNTER — Encounter: Payer: Self-pay | Admitting: Neurology

## 2018-07-27 DIAGNOSIS — Z79899 Other long term (current) drug therapy: Secondary | ICD-10-CM | POA: Diagnosis not present

## 2018-07-27 DIAGNOSIS — G35 Multiple sclerosis: Secondary | ICD-10-CM | POA: Diagnosis not present

## 2018-07-27 NOTE — Addendum Note (Signed)
Addended by: Calvert Cantor on: 07/27/2018 05:24 PM   Modules accepted: Orders

## 2018-07-27 NOTE — Telephone Encounter (Signed)
Placed [facility] lab in [facility] lock box for routine lab pick up. Results pending. 

## 2018-07-28 LAB — CBC WITH DIFFERENTIAL/PLATELET
Basophils Absolute: 0.1 10*3/uL (ref 0.0–0.2)
Basos: 1 %
EOS (ABSOLUTE): 0.4 10*3/uL (ref 0.0–0.4)
Eos: 5 %
Hematocrit: 41.7 % (ref 37.5–51.0)
Hemoglobin: 14.3 g/dL (ref 13.0–17.7)
Immature Grans (Abs): 0.1 10*3/uL (ref 0.0–0.1)
Immature Granulocytes: 1 %
Lymphocytes Absolute: 3.3 10*3/uL — ABNORMAL HIGH (ref 0.7–3.1)
Lymphs: 43 %
MCH: 29.7 pg (ref 26.6–33.0)
MCHC: 34.3 g/dL (ref 31.5–35.7)
MCV: 87 fL (ref 79–97)
Monocytes Absolute: 0.6 10*3/uL (ref 0.1–0.9)
Monocytes: 7 %
NRBC: 1 % — ABNORMAL HIGH (ref 0–0)
Neutrophils Absolute: 3.2 10*3/uL (ref 1.4–7.0)
Neutrophils: 43 %
Platelets: 252 10*3/uL (ref 150–450)
RBC: 4.82 x10E6/uL (ref 4.14–5.80)
RDW: 13.5 % (ref 11.6–15.4)
WBC: 7.5 10*3/uL (ref 3.4–10.8)

## 2018-08-01 ENCOUNTER — Telehealth: Payer: Self-pay | Admitting: *Deleted

## 2018-08-01 MED FILL — INVOKAMET XR 50-1,000 MG TA: 50-1000 | 30 days supply | Qty: 60 | Fill #3

## 2018-08-01 MED FILL — LISINOPRIL 5 MG TABLET: 5 | 90 days supply | Qty: 90 | Fill #1

## 2018-08-01 MED FILL — TAMSULOSIN HCL 0.4 MG CAP: 0.4 | 30 days supply | Qty: 30 | Fill #0

## 2018-08-01 NOTE — Telephone Encounter (Signed)
-----   Message from Britt Bottom, MD sent at 07/31/2018  8:49 AM EDT ----- He had CBC which was normal --- however, I don't see that the JCV Ab was also done.   COuld you check to see if this was done --- if not, next infusion we need to check J[facility]

## 2018-08-01 NOTE — Telephone Encounter (Signed)
Called, LVM for pt about results per Dr. Nathaneil Canary note. Advised JCV ab still pending and will let him know if it comes back abnormal.

## 2018-08-01 NOTE — Telephone Encounter (Signed)
I called [facility] and requested lab results for JCV be faxed to our office at [phone]. Waiting on results to be faxed

## 2018-08-02 ENCOUNTER — Ambulatory Visit (INDEPENDENT_AMBULATORY_CARE_PROVIDER_SITE_OTHER): Payer: BC Managed Care – PPO | Admitting: Neurology

## 2018-08-02 ENCOUNTER — Encounter: Payer: Self-pay | Admitting: Neurology

## 2018-08-02 ENCOUNTER — Other Ambulatory Visit: Payer: Self-pay

## 2018-08-02 VITALS — BP 118/72 | HR 72 | Temp 97.1°F | Ht 71.0 in | Wt 173.8 lb

## 2018-08-02 DIAGNOSIS — Z79899 Other long term (current) drug therapy: Secondary | ICD-10-CM

## 2018-08-02 DIAGNOSIS — R269 Unspecified abnormalities of gait and mobility: Secondary | ICD-10-CM

## 2018-08-02 DIAGNOSIS — N521 Erectile dysfunction due to diseases classified elsewhere: Secondary | ICD-10-CM

## 2018-08-02 DIAGNOSIS — G35 Multiple sclerosis: Secondary | ICD-10-CM | POA: Diagnosis not present

## 2018-08-02 DIAGNOSIS — F09 Unspecified mental disorder due to known physiological condition: Secondary | ICD-10-CM

## 2018-08-02 MED ORDER — BUPROPION HCL ER (XL) 150 MG PO TB24: 150.0000 mg | ORAL_TABLET | Freq: Every day | ORAL | 2 refills | Status: DC

## 2018-08-02 MED FILL — buPROPion HCL ER (XL) 150 M: 150 | 90 days supply | Qty: 90 | Fill #0

## 2018-08-02 NOTE — Progress Notes (Addendum)
[facility]  PATIENT: [name] DOB: [birth year]  REFERRING DOCTOR OR PCP:  [name] SOURCE: patient, notes from Dr. Brett Bernard, imaging/lab reports and MRI images were personally reviewed  _________________________________   HISTORICAL  CHIEF COMPLAINT:  Chief Complaint  Patient presents with  . Follow-up    RM 12, alone. Last seen 03/10/18.   . Multiple Sclerosis    On Tysabri. Last infusion: 07/27/2018. Next infusion: 08/29/2018.  Last JCV drawn 07/26/18. Results still pending.     HISTORY OF PRESENT ILLNESS:  [name] is a 48 y.o. man with relapsing remitting multiple sclerosis diagnosed in 1999.      Update 08/02/2018: He was diagnosed with MS in 1999.    He feels his relapsing remitting MS has been stable.   He is on Tysabri since January 2020.  The JCV was negative (0.33 and inhibition study performed).    He feels his gait is mildly off, especially shortly after standing up and with going down steps.    He holds the bannister going down but not up.    He has no falls.  He denies much weakness.  However, he feels he fatigues easily.     He denies numbness and has tingling in the right hand now and then if he sustains certain positions long enough.   Bladder function is ok with mild urgency but no incontinence or retention.   He has ED.   Vision is doing well (wears reading glasses.    Color vision is fine.   He slurs his words at time  He has physical = mental fatigue and does worse.  He tried Adderall a couple days and did not note any improvement and had a headache.  He was felt to have some depression and was started on Prozac.  He stopped after feeling no better after a month. He also has some anxieties and is worried about his future.   He sometimes has trouble coming up with the right words.   He sleeps ok most nights.   He dozes off 5/7 nights while watching TV.   He does not snore much and no one has noticed OSA signs.     Update  03/10/2018: He is on Tysabri with his first dose a few weeks ago.  He had no issues with the infusion.      He started to notice left hand numbness that started last Thursday.   The left leg also felt slightly weaker the last week.   He also noted more trouble with his balance last week.   He still has more numbness and weakness on his right.      He notes more difficulty with bladder and bowel urgency.    He also has an enlarged prostate and also has some hesitancy.      Cognitive issues seem worse over the past year.    From 11/18/2017: I had the pleasure of seeing your patient, [name], at the [place] center at [facility] for consultation regarding the possibility of MS and disease modifying therapy options  In 1999, he recalls having a GI virus and waking up the next day with numbness in his right arm.  He was referred to Dr. Marijean Bravo in [facility].   He had an LP showing elevated OCB and he was officially diagnosed with RRMS.    He started seeing Dr. Hart Robinsons at [facility] for a few years.  He was placed on Avonex and then Copaxone.  He tolerated the medications ok but did not note any improvements afterwards. He transferred care to Dr. Trula Ore who questioned the diagnosis of MS.   He stopped taking any DMT.   This July, on a hot day, he noticed tingling in his fingertips.  The next morning he woke up with right sided weakness and clumsiness in the right leg and numbness in the leg and arm.   He has not had much improvement since the onset of the new symptoms.  He saw Dr. Brett Bernard and had a repeat LP.  It showed OCB in the CSF and serum.   No change in walking afterwards.    Currently, straight line walking is ok but he stumbles with turns and upon his first few steps or on an uneven surface.     The right legg still feels mildly weak and clumsy.  Arms are fine.    He has urinary hesitancy and frequency.    He does fully empty by [place].  He also has an enlarged prostate.    Combination of Flomax and Cialis has helped.    He has fatigue, worse in heat.   He has insomnia due t problems getting comfortable and difficulty falling back asleep.   He had depression and anxiety earlier this year but has since improved.   There is mild anxiety.   He works as a Naval architect fluency issues.  He has reduced focus attention with some processing issues and reduced short term memory at times.     He has well controlled Type 2 DM, first treated with insulin and now with Invokana after weight loss and diet changes.     I personally reviewed the MRI images of the brain and cervical spine from 09/18/2017.   The MRI of the brain shows multiple T2/FLAIR hyperintense foci, mostly in the periventricular white matter radially oriented to the ventricles.  There are no lesions in the brainstem or cerebellum.  One small focus in the left parietal lobe enhanced after contrast.  There is generalized atrophy but the ventricles appear to be enlarged, mildly out of proportion to the extent of atrophy.  The MRI of the cervical spine showed 5-6 T2 hyperintense foci, most of which were posterolateral and none enhancing.  CSF 10/27/2017 showed > 5 oligoclonal bands in CSF and additional bands in CSF and serum.   Protein was markedly elevated at 119.   Glucose slightly elevated (diabetic).  WBC elevated at 31 with 92% lymphs.   REVIEW OF SYSTEMS: Constitutional: No fevers, chills, sweats, or change in appetite.  He has fatigue.  There is some insomnia. Eyes: No visual changes, double vision, eye pain Ear, nose and throat: No hearing loss, ear pain, nasal congestion, sore throat Cardiovascular: No chest pain, palpitations Respiratory: No shortness of breath at rest or with exertion.   No wheezes GastrointestinaI: No nausea, vomiting, diarrhea, abdominal pain, fecal incontinence Genitourinary: No dysuria, urinary retention or frequency.  No nocturia. Musculoskeletal: No neck pain,  back pain Integumentary: No rash, pruritus, skin lesions Neurological: as above Psychiatric: No depression at this time though he had some a few months ago.  No anxiety Endocrine: No palpitations, diaphoresis, change in appetite, change in weigh or increased thirst Hematologic/Lymphatic: No anemia, purpura, petechiae. Allergic/Immunologic: No itchy/runny eyes, nasal congestion, recent allergic reactions, rashes  ALLERGIES: No Known Allergies  HOME MEDICATIONS:  Current Outpatient Medications:  .  amphetamine-dextroamphetamine (ADDERALL XR) 15 MG 24 hr capsule, Take 1 capsule by mouth  every morning., Disp: 30 capsule, Rfl: 0 .  aspirin EC 81 MG tablet, Take 81 mg by mouth daily., Disp: , Rfl:  .  Canagliflozin-metFORMIN HCl ER (INVOKAMET XR) 50-1000 MG TB24, Take 1 tablet by mouth daily., Disp: , Rfl:  .  lisinopril (PRINIVIL,ZESTRIL) 5 MG tablet, , Disp: , Rfl: 2 .  natalizumab (TYSABRI) 300 MG/15ML injection, Inject 300 mg into the vein every 30 (thirty) days. Start form provided to [facility] on 12/06/17., Disp: , Rfl:  .  predniSONE (DELTASONE) 50 MG tablet, 10 po x 3 day (500 g) then 8 po x 1d then 6 po x 1d, then 4 po x 1d, then 2 po x1d, then 1 po x 1d, then 1/2 po x 1d.    HIGH DOSE ORAL TAPER FOR MS EXACERBATION., Disp: 52 tablet, Rfl: 0 .  simvastatin (ZOCOR) 40 MG tablet, TAKE 1 TABLET BY MOUTH DAILY AT 6:00 PM **NEED LABS DRAWN** (Patient taking differently: Take 40 mg by mouth daily at 6 PM. ), Disp: 15 tablet, Rfl: 0 .  tadalafil (CIALIS) 5 MG tablet, Take 5 mg by mouth daily as needed for erectile dysfunction., Disp: , Rfl:  .  tamsulosin (FLOMAX) 0.4 MG CAPS capsule, Take 0.4 mg by mouth., Disp: , Rfl:   PAST MEDICAL HISTORY: Past Medical History:  Diagnosis Date  . Diabetes mellitus without complication ([place])   . Hypertension   . Multiple sclerosis (HCC) 1999   Dr Trula Ore  . Nephrolithiasis    sees urology    PAST SURGICAL HISTORY: Past Surgical History:   Procedure Laterality Date  . APPENDECTOMY  2003  . LITHOTRIPSY      FAMILY HISTORY: Family History  Problem Relation Age of Onset  . Hypertension Mother   . Alcohol abuse Father   . Cancer Maternal Grandfather   . Diabetes Maternal Aunt     SOCIAL HISTORY:  Social History   Socioeconomic History  . Marital status: Married    Spouse name: christy  . Number of children: 2  . Years of education: Not on file  . Highest education level: Not on file  Occupational History  . 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 Financial resource strain: Not on file  . Food insecurity    Worry: Not on file    Inability: Not on file  . Transportation needs    Medical: Not on file    Non-medical: Not on file  Tobacco Use  . Smoking status: Former Smoker    Packs/day: 1.00    Types: Cigarettes    Quit date: 10/28/2011    Years since quitting: 6.7  . Smokeless tobacco: Never Used  Substance and Sexual Activity  . Alcohol use: Yes    Comment: sometimes   . Drug use: Yes    Types: Marijuana    Comment: Weekly  . Sexual activity: Yes  Lifestyle  . Physical activity    Days per week: Not on file    Minutes per session: Not on file  . Stress: Not on file  Relationships  . Social Herbalist on phone: Not on file    Gets together: Not on file    Attends religious service: Not on file    Active member of club or organization: Not on file    Attends meetings of clubs or organizations: Not on file    Relationship status: Not on file  . Intimate partner violence  Fear of current or ex partner: Not on file    Emotionally abused: Not on file    Physically abused: Not on file    Forced sexual activity: Not on file  Other Topics Concern  . Not on file  Social History Narrative   Right handed    Caffeine use: Diet soda     PHYSICAL EXAM  Vitals:   08/02/18 0859  BP: 118/72  Pulse: 72  Temp: (!) 97.1 F (36.2 C)  Weight: 173 lb 12.8 oz  (78.8 kg)  Height: 5\' 11"  (1.803 m)    Body mass index is 24.24 kg/m.   General: The patient is well-developed and well-nourished and in no acute distress   Skin: Extremities are without rash or edema.   Neurologic Exam  Mental status: The patient is alert and oriented x 3 at the time of the examination. The patient has apparent normal recent and remote memory, with an apparently normal attention span and concentration ability.   Speech is normal.  Cranial nerves: Extraocular movements are full.  Color vision is symmetric.  Facial strength and sensation was normal.  Trapezius strength was normal. No obvious hearing deficits are noted.  Motor:  Muscle bulk is normal.  Muscle tone is increased in the right leg.  Strength was 5/5 except 4+/5 in the right EHL.  Sensory: Sensory testing shows reduced sensation to temperature in the left leg but sytmmtric touch and vibration (was worse on right last time)  Coordination: Cerebellar testing reveals good finger-nose-finger and slightly right heel-to-shin .  Gait and station: Station is normal.   Gait is mildly wide and mildly spastic on the right.  The tandem gait is mildly wide but he can do.  Romberg is negative.  Reflexes: Deep tendon reflexes are increased, legs more than arms.  There are cross abductor reflexes at the knees and nonsustained clonus at the ankles.    DIAGNOSTIC DATA (LABS, IMAGING, TESTING) - I reviewed patient records, labs, notes, testing and imaging myself where available.  Lab Results  Component Value Date   WBC 7.5 07/27/2018   HGB 14.3 07/27/2018   HCT 41.7 07/27/2018   MCV 87 07/27/2018   PLT 252 07/27/2018      Component Value Date/Time   NA 140 10/12/2017 0824   K 3.8 10/12/2017 0824   CL 102 10/12/2017 0824   CO2 25 10/12/2017 0824   GLUCOSE 157 (H) 10/12/2017 0824   BUN 13 10/12/2017 0824   CREATININE 0.71 10/12/2017 0824   CALCIUM 10.1 10/12/2017 0824   PROT 7.5 11/18/2017 1023   ALBUMIN 5.2  11/18/2017 1023   AST 17 11/18/2017 1023   ALT 16 11/18/2017 1023   ALKPHOS 65 11/18/2017 1023   BILITOT 0.4 11/18/2017 1023   GFRNONAA 108 09/16/2017 1056   GFRAA 125 09/16/2017 1056   Lab Results  Component Value Date   CHOL 121 10/12/2016   HDL 22 (A) 10/12/2016   LDLCALC 56 10/12/2016   LDLDIRECT 111 (H) 02/15/2014   TRIG 215 (A) 10/12/2016   CHOLHDL 6.3 02/15/2014   Lab Results  Component Value Date   HGBA1C 6.5 10/12/2016      ASSESSMENT AND PLAN  1. Multiple sclerosis (HCC)   2. Gait disturbance   3. High risk medication use   4. Erectile dysfunction due to diseases classified elsewhere   5. Cognitive deficit secondary to multiple sclerosis ([place])     1.   Continue Tysabri for relapsing remitting MS. 2.  Continue  tamsulosin for bladder hesitancy 3.   Wellbutrin XL for mood and fatigue 4.   He will return to see me in 6 months or sooner if there are new or worsening neurologic symptoms.  Richard A. Felecia Shelling, MD, [facility] 08/29/313, 9:45 AM Certified in Neurology, Clinical Neurophysiology, Sleep Medicine, Pain Medicine and Neuroimaging  [facility] [address] [place], [place] [address] [phone]

## 2018-08-08 NOTE — Telephone Encounter (Signed)
JCV drawn on 07/27/18 indeterminate, index: 0.22. Inhibition assay: negative.

## 2018-08-24 DIAGNOSIS — G35 Multiple sclerosis: Secondary | ICD-10-CM | POA: Diagnosis not present

## 2018-08-31 ENCOUNTER — Ambulatory Visit: Payer: BC Managed Care – PPO | Admitting: Neurology

## 2018-08-31 MED FILL — INVOKAMET XR 50-1,000 MG TA: 50-1000 | 30 days supply | Qty: 60 | Fill #4

## 2018-08-31 MED FILL — TAMSULOSIN HCL 0.4 MG CAP: 0.4 | 30 days supply | Qty: 30 | Fill #1

## 2018-08-31 MED FILL — SIMVASTATIN 40 MG TABLET: 40 | 90 days supply | Qty: 90 | Fill #0

## 2018-09-12 DIAGNOSIS — H04123 Dry eye syndrome of bilateral lacrimal glands: Secondary | ICD-10-CM | POA: Insufficient documentation

## 2018-09-12 DIAGNOSIS — H524 Presbyopia: Secondary | ICD-10-CM | POA: Insufficient documentation

## 2018-09-12 DIAGNOSIS — Z7984 Long term (current) use of oral hypoglycemic drugs: Secondary | ICD-10-CM | POA: Diagnosis not present

## 2018-09-12 DIAGNOSIS — G35 Multiple sclerosis: Secondary | ICD-10-CM | POA: Diagnosis not present

## 2018-09-12 DIAGNOSIS — E119 Type 2 diabetes mellitus without complications: Secondary | ICD-10-CM | POA: Diagnosis not present

## 2018-09-21 DIAGNOSIS — G35 Multiple sclerosis: Secondary | ICD-10-CM | POA: Diagnosis not present

## 2018-09-29 MED FILL — INVOKAMET XR 50-1,000 MG TA: 50-1000 | 30 days supply | Qty: 60 | Fill #5

## 2018-09-29 MED FILL — TAMSULOSIN HCL 0.4 MG CAP: 0.4 | 30 days supply | Qty: 30 | Fill #0

## 2018-10-07 DIAGNOSIS — E119 Type 2 diabetes mellitus without complications: Secondary | ICD-10-CM | POA: Diagnosis not present

## 2018-10-17 DIAGNOSIS — E1169 Type 2 diabetes mellitus with other specified complication: Secondary | ICD-10-CM | POA: Diagnosis not present

## 2018-10-17 DIAGNOSIS — E782 Mixed hyperlipidemia: Secondary | ICD-10-CM | POA: Diagnosis not present

## 2018-10-19 DIAGNOSIS — G35 Multiple sclerosis: Secondary | ICD-10-CM | POA: Diagnosis not present

## 2018-10-24 ENCOUNTER — Ambulatory Visit (INDEPENDENT_AMBULATORY_CARE_PROVIDER_SITE_OTHER): Payer: BC Managed Care – PPO | Admitting: Family Medicine

## 2018-10-24 ENCOUNTER — Encounter: Payer: Self-pay | Admitting: Family Medicine

## 2018-10-24 ENCOUNTER — Ambulatory Visit (INDEPENDENT_AMBULATORY_CARE_PROVIDER_SITE_OTHER): Payer: BC Managed Care – PPO

## 2018-10-24 ENCOUNTER — Other Ambulatory Visit: Payer: Self-pay

## 2018-10-24 VITALS — BP 106/66 | HR 90 | Ht 71.0 in | Wt 179.0 lb

## 2018-10-24 DIAGNOSIS — Z0000 Encounter for general adult medical examination without abnormal findings: Secondary | ICD-10-CM | POA: Diagnosis not present

## 2018-10-24 DIAGNOSIS — Z23 Encounter for immunization: Secondary | ICD-10-CM

## 2018-10-24 DIAGNOSIS — K2100 Gastro-esophageal reflux disease with esophagitis, without bleeding: Secondary | ICD-10-CM

## 2018-10-24 DIAGNOSIS — E781 Pure hyperglyceridemia: Secondary | ICD-10-CM | POA: Diagnosis not present

## 2018-10-24 DIAGNOSIS — E1121 Type 2 diabetes mellitus with diabetic nephropathy: Secondary | ICD-10-CM | POA: Diagnosis not present

## 2018-10-24 DIAGNOSIS — Z0001 Encounter for general adult medical examination with abnormal findings: Secondary | ICD-10-CM | POA: Diagnosis not present

## 2018-10-24 DIAGNOSIS — R0602 Shortness of breath: Secondary | ICD-10-CM | POA: Diagnosis not present

## 2018-10-24 LAB — HM DIABETES EYE EXAM

## 2018-10-24 MED ORDER — OMEPRAZOLE 20 MG PO CPDR: 20.0000 mg | DELAYED_RELEASE_CAPSULE | Freq: Every day | ORAL | 1 refills | Status: DC

## 2018-10-24 MED FILL — OMEPRAZOLE 20 MG CAP: 20 | 30 days supply | Qty: 30 | Fill #0

## 2018-10-24 NOTE — Progress Notes (Signed)
Established Patient Office Visit - CPE  Subjective:  Patient ID: [name], male    DOB: [birth year]  Age: 48 y.o. MRN: [id number]  CC:  Chief Complaint  Patient presents with  . Annual Exam    HPI [name] presents for CPE.  He has been having SOB when he goes up the stairs with minimal activity.  No cough, no wheezing, no palpitations, no chest pain.  He is not currently exercising routinely.  He says he noticed it started about 6 months ago.  Initially he thought maybe it was just a little bit of spring allergies that was causing him to feel little bit more short of breath but it has not resolved.  He does have a history of asthma but says the last time he used an albuterol inhaler was when he was a teenager.  Is also been noticing a little bit more heartburn recently about 3 times per week mostly at night when he lays down.  Says usually he can just wrinkle little bit of water it lasts a few minutes and then it goes away so he has not actually tried taking medication for it.  Past Medical History:  Diagnosis Date  . Diabetes mellitus without complication ([place])   . Hypertension   . Multiple sclerosis (HCC) 1999   Dr Trula Ore  . Nephrolithiasis    sees urology    Past Surgical History:  Procedure Laterality Date  . APPENDECTOMY  2003  . LITHOTRIPSY      Family History  Problem Relation Age of Onset  . Hypertension Mother   . Alcohol abuse Father   . Cancer Maternal Grandfather   . Diabetes Maternal Aunt     Social History   Socioeconomic History  . Marital status: Married    Spouse name: christy  . Number of children: 2  . Years of education: Not on file  . Highest education level: Not on file  Occupational History  . 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 Financial resource strain: Not on file  . Food insecurity    Worry: Not on file    Inability: Not on file  . Transportation needs    Medical: Not on file     Non-medical: Not on file  Tobacco Use  . Smoking status: Former Smoker    Packs/day: 1.00    Types: Cigarettes    Quit date: 10/28/2011    Years since quitting: 6.9  . Smokeless tobacco: Never Used  Substance and Sexual Activity  . Alcohol use: Yes    Comment: sometimes   . Drug use: Yes    Types: Marijuana    Comment: Weekly  . Sexual activity: Yes  Lifestyle  . Physical activity    Days per week: Not on file    Minutes per session: Not on file  . Stress: Not on file  Relationships  . Social Herbalist on phone: Not on file    Gets together: Not on file    Attends religious service: Not on file    Active member of club or organization: Not on file    Attends meetings of clubs or organizations: Not on file    Relationship status: Not on file  . Intimate partner violence    Fear of current or ex partner: Not on file    Emotionally abused: Not on file    Physically abused: Not on file  Forced sexual activity: Not on file  Other Topics Concern  . Not on file  Social History Narrative   Right handed    Caffeine use: Diet soda    Outpatient Medications Prior to Visit  Medication Sig Dispense Refill  . aspirin EC 81 MG tablet Take 81 mg by mouth daily.    Marland Kitchen buPROPion (WELLBUTRIN XL) 150 MG 24 hr tablet Take 1 tablet (150 mg total) by mouth daily. 90 tablet 2  . Canagliflozin-metFORMIN HCl ER (INVOKAMET XR) 50-1000 MG TB24 Take 1 tablet by mouth daily.    Marland Kitchen lisinopril (PRINIVIL,ZESTRIL) 5 MG tablet   2  . natalizumab (TYSABRI) 300 MG/15ML injection Inject 300 mg into the vein every 30 (thirty) days. Start form provided to [facility] on 12/06/17.    Marland Kitchen predniSONE (DELTASONE) 50 MG tablet 10 po x 3 day (500 g) then 8 po x 1d then 6 po x 1d, then 4 po x 1d, then 2 po x1d, then 1 po x 1d, then 1/2 po x 1d.    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 52 tablet 0  . simvastatin (ZOCOR) 40 MG tablet TAKE 1 TABLET BY MOUTH DAILY AT 6:00 PM **NEED LABS DRAWN** (Patient  taking differently: Take 40 mg by mouth daily at 6 PM. ) 15 tablet 0  . tadalafil (CIALIS) 5 MG tablet Take 5 mg by mouth daily as needed for erectile dysfunction.    . tamsulosin (FLOMAX) 0.4 MG CAPS capsule Take 0.4 mg by mouth.     No facility-administered medications prior to visit.     No Known Allergies  ROS Review of Systems    Objective:    Physical Exam  Constitutional: He is oriented to person, place, and time. He appears well-developed and well-nourished.  HENT:  Head: Normocephalic and atraumatic.  Right Ear: External ear normal.  Left Ear: External ear normal.  Nose: Nose normal.  Mouth/Throat: Oropharynx is clear and moist.  Eyes: Pupils are equal, round, and reactive to light. Conjunctivae and EOM are normal.  Neck: Normal range of motion. Neck supple. No thyromegaly present.  Cardiovascular: Normal rate, regular rhythm, normal heart sounds and intact distal pulses.  Pulmonary/Chest: Effort normal and breath sounds normal.  Abdominal: Soft. Bowel sounds are normal. He exhibits no distension and no mass. There is no abdominal tenderness. There is no rebound and no guarding.  Musculoskeletal: Normal range of motion.  Lymphadenopathy:    He has no cervical adenopathy.  Neurological: He is alert and oriented to person, place, and time. He has normal reflexes.  Skin: Skin is warm and dry.  Psychiatric: He has a normal mood and affect. His behavior is normal. Judgment and thought content normal.    BP 106/66   Pulse 90   Ht 5\' 11"  (1.803 m)   Wt 179 lb (81.2 kg)   SpO2 99%   BMI 24.97 kg/m  Wt Readings from Last 3 Encounters:  10/24/18 179 lb (81.2 kg)  08/02/18 173 lb 12.8 oz (78.8 kg)  03/10/18 177 lb 8 oz (80.5 kg)     Health Maintenance Due  Topic Date Due  . OPHTHALMOLOGY EXAM  03/20/2016  . HEMOGLOBIN A1C  04/11/2017  . FOOT EXAM  10/12/2017  . INFLUENZA VACCINE  08/20/2018    There are no preventive care reminders to display for this  patient.  No results found for: TSH Lab Results  Component Value Date   WBC 7.5 07/27/2018   HGB 14.3 07/27/2018  HCT 41.7 07/27/2018   MCV 87 07/27/2018   PLT 252 07/27/2018   Lab Results  Component Value Date   NA 140 10/12/2017   K 3.8 10/12/2017   CO2 25 10/12/2017   GLUCOSE 157 (H) 10/12/2017   BUN 13 10/12/2017   CREATININE 0.71 10/12/2017   BILITOT 0.4 11/18/2017   ALKPHOS 65 11/18/2017   AST 17 11/18/2017   ALT 16 11/18/2017   PROT 7.5 11/18/2017   ALBUMIN 5.2 11/18/2017   CALCIUM 10.1 10/12/2017   Lab Results  Component Value Date   CHOL 121 10/12/2016   Lab Results  Component Value Date   HDL 22 (A) 10/12/2016   Lab Results  Component Value Date   LDLCALC 56 10/12/2016   Lab Results  Component Value Date   TRIG 215 (A) 10/12/2016   Lab Results  Component Value Date   CHOLHDL 6.3 02/15/2014   Lab Results  Component Value Date   HGBA1C 6.5 10/12/2016      Assessment & Plan:   Problem List Items Addressed This Visit      Endocrine   Diabetes mellitus, type 2 ([place])   Relevant Orders   COMPLETE METABOLIC PANEL WITH GFR   Lipid Panel w/reflex Direct LDL   HgB A1c    Other Visit Diagnoses    Routine general medical examination at a health care facility    -  Primary   Relevant Orders   COMPLETE METABOLIC PANEL WITH GFR   Lipid Panel w/reflex Direct LDL   HgB A1c   Need for immunization against influenza       Relevant Orders   Flu Vaccine QUAD 36+ mos IM (Completed)   SOB (shortness of breath)       Relevant Orders   EKG 12-Lead   DG Chest 2 View   Gastroesophageal reflux disease with esophagitis without hemorrhage         CPE - Keep up a regular exercise program and make sure you are eating a healthy diet Try to eat 4 servings of dairy a day, or if you are lactose intolerant take a calcium with vitamin D daily.  Your vaccines are up to date.    SOB -unclear etiology.  Consider further work-up for pulmonary and cardiac  evaluation.  Will get chest x-ray today.  EKG today shows rate of 72 bpm, normal sinus rhythm.  Slight ST elevation in V3, no as no acute ST-T wave changes.  Consider deconditioning as a cause as well.  Keep up a regular exercise program and make sure you are eating a healthy diet Try to eat 4 servings of dairy a day, or if you are lactose intolerant take a calcium with vitamin D daily.  Your vaccines are up to date.   GERD -additional information and handout for reflux diet.  Recommend a trial of omeprazole  2 weeks, if your symptoms improve then I want you to take the medication for complete 6 weeks and then discontinue.  This usually will get the reflux under control.    Meds ordered this encounter  Medications  . omeprazole (PRILOSEC) 20 MG capsule    Sig: Take 1 capsule (20 mg total) by mouth daily.    Dispense:  30 capsule    Refill:  1    Follow-up: Return in about 4 weeks (around 11/21/2018) for Shortness of breath .    Beatrice Lecher, MD

## 2018-10-24 NOTE — Patient Instructions (Addendum)
For your reflux I would like you to try prilosec for 2 weeks, if your symptoms improve then I want you to take the medication for complete 6 weeks and then discontinue.  This usually will get the reflux under control.  Also see the information below about reflux diet and foods to avoid.   Food Choices for Gastroesophageal Reflux Disease, Adult When you have gastroesophageal reflux disease (GERD), the foods you eat and your eating habits are very important. Choosing the right foods can help ease the discomfort of GERD. Consider working with a diet and nutrition specialist (dietitian) to help you make healthy food choices. What general guidelines should I follow?  Eating plan  Choose healthy foods low in fat, such as fruits, vegetables, whole grains, low-fat dairy products, and lean meat, fish, and poultry.  Eat frequent, small meals instead of three large meals each day. Eat your meals slowly, in a relaxed setting. Avoid bending over or lying down until 2-3 hours after eating.  Limit high-fat foods such as fatty meats or fried foods.  Limit your intake of oils, butter, and shortening to less than 8 teaspoons each day.  Avoid the following: ? Foods that cause symptoms. These may be different for different people. Keep a food diary to keep track of foods that cause symptoms. ? Alcohol. ? Drinking large amounts of liquid with meals. ? Eating meals during the 2-3 hours before bed.  Cook foods using methods other than frying. This may include baking, grilling, or broiling. Lifestyle  Maintain a healthy weight. Ask your health care provider what weight is healthy for you. If you need to lose weight, work with your health care provider to do so safely.  Exercise for at least 30 minutes on 5 or more days each week, or as told by your health care provider.  Avoid wearing clothes that fit tightly around your waist and chest.  Do not use any products that contain nicotine or tobacco, such as  cigarettes and e-cigarettes. If you need help quitting, ask your health care provider.  Sleep with the head of your bed raised. Use a wedge under the mattress or blocks under the bed frame to raise the head of the bed. What foods are not recommended? The items listed may not be a complete list. Talk with your dietitian about what dietary choices are best for you. Grains Pastries or quick breads with added fat. [place] toast. Vegetables Deep fried vegetables. [place] fries. Any vegetables prepared with added fat. Any vegetables that cause symptoms. For some people this may include tomatoes and tomato products, chili peppers, onions and garlic, and horseradish. Fruits Any fruits prepared with added fat. Any fruits that cause symptoms. For some people this may include citrus fruits, such as oranges, grapefruit, pineapple, and lemons. Meats and other protein foods High-fat meats, such as fatty beef or pork, hot dogs, ribs, ham, sausage, salami and bacon. Fried meat or protein, including fried fish and fried chicken. Nuts and nut butters. Dairy Whole milk and chocolate milk. Sour cream. Cream. Ice cream. Cream cheese. Milk shakes. Beverages Coffee and tea, with or without caffeine. Carbonated beverages. Sodas. Energy drinks. Fruit juice made with acidic fruits (such as orange or grapefruit). Tomato juice. Alcoholic drinks. Fats and oils Butter. Margarine. Shortening. Ghee. Sweets and desserts Chocolate and cocoa. Donuts. Seasoning and other foods Pepper. Peppermint and spearmint. Any condiments, herbs, or seasonings that cause symptoms. For some people, this may include curry, hot sauce, or vinegar-based salad dressings. Summary  When you have gastroesophageal reflux disease (GERD), food and lifestyle choices are very important to help ease the discomfort of GERD.  Eat frequent, small meals instead of three large meals each day. Eat your meals slowly, in a relaxed setting. Avoid bending over or  lying down until 2-3 hours after eating.  Limit high-fat foods such as fatty meat or fried foods. This information is not intended to replace advice given to you by your health care provider. Make sure you discuss any questions you have with your health care provider. Document Released: 01/05/2005 Document Revised: 04/28/2018 Document Reviewed: 01/07/2016 Elsevier Patient Education  2020 [employer].

## 2018-10-25 DIAGNOSIS — E119 Type 2 diabetes mellitus without complications: Secondary | ICD-10-CM | POA: Diagnosis not present

## 2018-10-25 LAB — COMPLETE METABOLIC PANEL WITH GFR
AG Ratio: 2.1 (calc) (ref 1.0–2.5)
ALT: 16 U/L (ref 9–46)
AST: 14 U/L (ref 10–40)
Albumin: 5 g/dL (ref 3.6–5.1)
Alkaline phosphatase (APISO): 57 U/L (ref 36–130)
BUN: 13 mg/dL (ref 7–25)
CO2: 26 mmol/L (ref 20–32)
Calcium: 10.2 mg/dL (ref 8.6–10.3)
Chloride: 102 mmol/L (ref 98–110)
Creat: 0.75 mg/dL (ref 0.60–1.35)
GFR, Est African American: 127 mL/min/{1.73_m2} (ref >=60)
GFR, Est Non African American: 109 mL/min/{1.73_m2} (ref >=60)
Globulin: 2.4 g/dL (calc) (ref 1.9–3.7)
Glucose, Bld: 90 mg/dL (ref 65–99)
Potassium: 4.1 mmol/L (ref 3.5–5.3)
Sodium: 139 mmol/L (ref 135–146)
Total Bilirubin: 0.6 mg/dL (ref 0.2–1.2)
Total Protein: 7.4 g/dL (ref 6.1–8.1)

## 2018-10-25 LAB — LIPID PANEL W/REFLEX DIRECT LDL
Cholesterol: 178 mg/dL (ref <200)
HDL: 29 mg/dL — ABNORMAL LOW (ref >=40)
LDL Cholesterol (Calc): 109 mg/dL (calc) — ABNORMAL HIGH
Non-HDL Cholesterol (Calc): 149 mg/dL (calc) — ABNORMAL HIGH (ref <130)
Total CHOL/HDL Ratio: 6.1 (calc) — ABNORMAL HIGH (ref <5.0)
Triglycerides: 282 mg/dL — ABNORMAL HIGH (ref <150)

## 2018-10-25 LAB — HEMOGLOBIN A1C
Hgb A1c MFr Bld: 7 % of total Hgb — ABNORMAL HIGH (ref <5.7)
Mean Plasma Glucose: 154 (calc)
eAG (mmol/L): 8.5 (calc)

## 2018-10-31 DIAGNOSIS — N138 Other obstructive and reflux uropathy: Secondary | ICD-10-CM | POA: Diagnosis not present

## 2018-10-31 DIAGNOSIS — N401 Enlarged prostate with lower urinary tract symptoms: Secondary | ICD-10-CM | POA: Diagnosis not present

## 2018-10-31 DIAGNOSIS — N200 Calculus of kidney: Secondary | ICD-10-CM | POA: Diagnosis not present

## 2018-10-31 DIAGNOSIS — N529 Male erectile dysfunction, unspecified: Secondary | ICD-10-CM | POA: Diagnosis not present

## 2018-10-31 MED FILL — OMEPRAZOLE 20 MG CAP: 20 | 30 days supply | Qty: 30 | Fill #0

## 2018-10-31 MED FILL — TAMSULOSIN HCL 0.4 MG CAP: 0.4 | 30 days supply | Qty: 30 | Fill #0

## 2018-11-01 MED FILL — INVOKAMET XR 50-1,000 MG TA: 50-1000 | 30 days supply | Qty: 60 | Fill #0

## 2018-11-17 DIAGNOSIS — G35 Multiple sclerosis: Secondary | ICD-10-CM | POA: Diagnosis not present

## 2018-11-18 ENCOUNTER — Telehealth: Payer: Self-pay | Admitting: *Deleted

## 2018-11-18 NOTE — Telephone Encounter (Signed)
lvm asking if he can come in earlier on Monday 11/21/2018 either 1010,1030, or 1050. Asked that he rtn call if he can do this.Marland KitchenMarland KitchenElouise Munroe, [place]

## 2018-11-21 ENCOUNTER — Ambulatory Visit: Payer: BC Managed Care – PPO | Admitting: Family Medicine

## 2018-11-21 NOTE — Telephone Encounter (Signed)
lvm asking that he rtn call ASAP if he can come in this morning.Maryruth Eve, [place], CMA

## 2018-11-24 ENCOUNTER — Ambulatory Visit (INDEPENDENT_AMBULATORY_CARE_PROVIDER_SITE_OTHER): Payer: BC Managed Care – PPO | Admitting: Family Medicine

## 2018-11-24 ENCOUNTER — Encounter: Payer: Self-pay | Admitting: Family Medicine

## 2018-11-24 ENCOUNTER — Other Ambulatory Visit: Payer: Self-pay

## 2018-11-24 VITALS — BP 139/86 | HR 97 | Temp 97.8°F | Wt 174.0 lb

## 2018-11-24 DIAGNOSIS — N401 Enlarged prostate with lower urinary tract symptoms: Secondary | ICD-10-CM | POA: Diagnosis not present

## 2018-11-24 DIAGNOSIS — H9313 Tinnitus, bilateral: Secondary | ICD-10-CM

## 2018-11-24 DIAGNOSIS — G35 Multiple sclerosis: Secondary | ICD-10-CM | POA: Diagnosis not present

## 2018-11-24 DIAGNOSIS — R0602 Shortness of breath: Secondary | ICD-10-CM | POA: Diagnosis not present

## 2018-11-24 DIAGNOSIS — E1121 Type 2 diabetes mellitus with diabetic nephropathy: Secondary | ICD-10-CM

## 2018-11-24 DIAGNOSIS — N138 Other obstructive and reflux uropathy: Secondary | ICD-10-CM

## 2018-11-24 NOTE — Assessment & Plan Note (Signed)
Okay to refill Cialis and Flomax when needed.  I will take over these prescriptions for him since his urologist is leaving the area.  I will be happy to follow him if at any point any new problems develop or things are changing then will be happy to refer him back or if he gets kidney stones again.

## 2018-11-24 NOTE — Assessment & Plan Note (Signed)
Follow-up with Dr. Jennye Moccasin in January and will have repeat imaging done at that time for his MS.

## 2018-11-24 NOTE — Progress Notes (Signed)
Established Patient Office Visit  Subjective:  Patient ID: [name], male    DOB: 01-Oct-1970  Age: 48 y.o. MRN: [id number]  CC: No chief complaint on file.   HPI Whelan Sahm presents for shortness of breath and reflux-please see prior note.  He complained of increasing shortness of breath that occurred with activity.  He had similar symptoms about 6 months ago but then it seemed to improve until recently.  He also complained of an increase in reflux symptoms we decided to put him on a trial of a PPI.  He never actually tried the PPI but his reflux eventually got better.  He also reports that his shortness of breath also eventually resolved.  He think it was somewhat seasonal related.  Diabetes-he did follow-up with Dr. Veva Holes his endocrinologist.  He says his A1c overall looked good but his cholesterol was high.  He is still taking his simvastatin regularly.  In regards to him at his MS he still following with Dr. Felecia Shelling, neurology in [place].  He still gets some numbness in his right leg.  He has been trying to stay active by walking his dog.  He also let me know that his urologist has retired.  They were following him for kidney stones as well as BPH.  They currently have him on Flomax 0.4 mg daily as well as sildenafil 5 mg daily and that seems to be working well.  He wants to know if I would be willing to take over his prescriptions.  He also reports that he has had some ear ringing in both ears.  Is been going on for a long time but is just been getting a little bit louder.  He said he was exposed to a lot of loud music and noise when he was younger which is pretty sure what may have caused it he wondered if there was anything new or over-the-counter that might actually be helpful for this.  He says he also had noticed that a week or so ago he was stretching and felt a sharp sudden pain in the right lower quadrant near his incision that he had for his appendectomy.  He  wonders if he may have pulled something.  He says he noticed a little bit of a swelling or lump in that area afterwards.  Past Medical History:  Diagnosis Date  . Diabetes mellitus without complication ([place])   . Hypertension   . Multiple sclerosis ([facility]) 1999   Dr Trula Ore  . Nephrolithiasis    sees urology    Past Surgical History:  Procedure Laterality Date  . APPENDECTOMY  2003  . LITHOTRIPSY      Family History  Problem Relation Age of Onset  . Hypertension Mother   . Alcohol abuse Father   . Cancer Maternal Grandfather   . Diabetes Maternal Aunt     Social History   Socioeconomic History  . Marital status: Married    Spouse name: christy  . Number of children: 2  . Years of education: Not on file  . Highest education level: Not on file  Occupational History  . Occupation: IT    Comment: works in [place]  . Financial resource strain: Not on file  . Food insecurity    Worry: Not on file    Inability: Not on file  . Transportation needs    Medical: Not on file    Non-medical: Not on file  Tobacco Use  . Smoking status:  Former Smoker    Packs/day: 1.00    Types: Cigarettes    Quit date: 10/28/2011    Years since quitting: 7.0  . Smokeless tobacco: Never Used  Substance and Sexual Activity  . Alcohol use: Yes    Comment: sometimes   . Drug use: Yes    Types: Marijuana    Comment: Weekly  . Sexual activity: Yes  Lifestyle  . Physical activity    Days per week: Not on file    Minutes per session: Not on file  . Stress: Not on file  Relationships  . Social Herbalist on phone: Not on file    Gets together: Not on file    Attends religious service: Not on file    Active member of club or organization: Not on file    Attends meetings of clubs or organizations: Not on file    Relationship status: Not on file  . Intimate partner violence    Fear of current or ex partner: Not on file    Emotionally abused: Not on file     Physically abused: Not on file    Forced sexual activity: Not on file  Other Topics Concern  . Not on file  Social History Narrative   Right handed    Caffeine use: Diet soda    Outpatient Medications Prior to Visit  Medication Sig Dispense Refill  . aspirin EC 81 MG tablet Take 81 mg by mouth daily.    . Canagliflozin-metFORMIN HCl ER (INVOKAMET XR) 50-1000 MG TB24 Take 1 tablet by mouth daily.    Marland Kitchen lisinopril (PRINIVIL,ZESTRIL) 5 MG tablet   2  . natalizumab (TYSABRI) 300 MG/15ML injection Inject 300 mg into the vein every 30 (thirty) days. Start form provided to [facility] on 12/06/17.    Marland Kitchen omeprazole (PRILOSEC) 20 MG capsule Take 1 capsule (20 mg total) by mouth daily. 30 capsule 1  . simvastatin (ZOCOR) 40 MG tablet TAKE 1 TABLET BY MOUTH DAILY AT 6:00 PM **NEED LABS DRAWN** (Patient taking differently: Take 40 mg by mouth daily at 6 PM. ) 15 tablet 0  . tadalafil (CIALIS) 5 MG tablet Take 5 mg by mouth daily as needed for erectile dysfunction.    . tamsulosin (FLOMAX) 0.4 MG CAPS capsule Take 0.4 mg by mouth.    Marland Kitchen buPROPion (WELLBUTRIN XL) 150 MG 24 hr tablet Take 1 tablet (150 mg total) by mouth daily. (Patient not taking: Reported on 11/24/2018) 90 tablet 2  . predniSONE (DELTASONE) 50 MG tablet 10 po x 3 day (500 g) then 8 po x 1d then 6 po x 1d, then 4 po x 1d, then 2 po x1d, then 1 po x 1d, then 1/2 po x 1d.    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 (Patient not taking: Reported on 11/24/2018) 52 tablet 0   No facility-administered medications prior to visit.     No Known Allergies  ROS Review of Systems    Objective:    Physical Exam  Constitutional: He is oriented to person, place, and time. He appears well-developed and well-nourished.  HENT:  Head: Normocephalic and atraumatic.  Eyes: Conjunctivae and EOM are normal.  Cardiovascular: Normal rate.  Pulmonary/Chest: Effort normal.  Abdominal: Soft. He exhibits no distension and no mass. There is no abdominal  tenderness. There is no rebound and no guarding.  Well-healed surgical incision in the right lower quadrant.  I do feel little bit yet in the medial part of the  wound but I do not feel any true herniation.  Had them do sort of a crunch to see if I can feel he pushed back of the bowel.  I did not palpate any mass or lesion.  Neurological: He is alert and oriented to person, place, and time.  Skin: Skin is dry. No pallor.  Psychiatric: He has a normal mood and affect. His behavior is normal.  Vitals reviewed.   BP 139/86   Pulse 97   Temp 97.8 F (36.6 C) (Oral)   Wt 174 lb (78.9 kg)   BMI 24.27 kg/m  Wt Readings from Last 3 Encounters:  11/24/18 174 lb (78.9 kg)  10/24/18 179 lb (81.2 kg)  08/02/18 173 lb 12.8 oz (78.8 kg)     Health Maintenance Due  Topic Date Due  . OPHTHALMOLOGY EXAM  03/20/2016  . FOOT EXAM  10/12/2017    There are no preventive care reminders to display for this patient.  No results found for: TSH Lab Results  Component Value Date   WBC 7.5 07/27/2018   HGB 14.3 07/27/2018   HCT 41.7 07/27/2018   MCV 87 07/27/2018   PLT 252 07/27/2018   Lab Results  Component Value Date   NA 139 10/24/2018   K 4.1 10/24/2018   CO2 26 10/24/2018   GLUCOSE 90 10/24/2018   BUN 13 10/24/2018   CREATININE 0.75 10/24/2018   BILITOT 0.6 10/24/2018   ALKPHOS 65 11/18/2017   AST 14 10/24/2018   ALT 16 10/24/2018   PROT 7.4 10/24/2018   ALBUMIN 5.2 11/18/2017   CALCIUM 10.2 10/24/2018   Lab Results  Component Value Date   CHOL 178 10/24/2018   Lab Results  Component Value Date   HDL 29 (L) 10/24/2018   Lab Results  Component Value Date   LDLCALC 109 (H) 10/24/2018   Lab Results  Component Value Date   TRIG 282 (H) 10/24/2018   Lab Results  Component Value Date   CHOLHDL 6.1 (H) 10/24/2018   Lab Results  Component Value Date   HGBA1C 7.0 (H) 10/24/2018      Assessment & Plan:   Problem List Items Addressed This Visit      Endocrine    Diabetes mellitus, type 2 ([place])    I will be seeing endocrinology once a year so need to see him back in 6 months.        Nervous and Auditory   Multiple sclerosis ([place])    Follow-up with Dr. Jennye Moccasin in January and will have repeat imaging done at that time for his MS.        Genitourinary   Benign prostatic hyperplasia with urinary obstruction    Okay to refill Cialis and Flomax when needed.  I will take over these prescriptions for him since his urologist is leaving the area.  I will be happy to follow him if at any point any new problems develop or things are changing then will be happy to refer him back or if he gets kidney stones again.       Other Visit Diagnoses    SOB (shortness of breath)    -  Primary   Tinnitus of both ears         Shortness of breath-resolved.  Tinnitus-unfortunately there are no real good treatments for this.  Certainly if it gets more advanced sometimes there is some surgical procedures that can be performed.  There are some over-the-counter supplements.  But the evidence is only  fair.  Shortness of breath-resolved.  No orders of the defined types were placed in this encounter.   Follow-up: Return in about 6 months (around 05/24/2019) for Diabetes follow-up.    Beatrice Lecher, MD

## 2018-11-24 NOTE — Assessment & Plan Note (Signed)
I will be seeing endocrinology once a year so need to see him back in 6 months.

## 2018-11-28 MED FILL — TAMSULOSIN HCL 0.4 MG CAP: 0.4 | 30 days supply | Qty: 30 | Fill #1

## 2018-11-28 MED FILL — LISINOPRIL 5 MG TABLET: 5 | 90 days supply | Qty: 90 | Fill #0

## 2018-11-28 MED FILL — OMEPRAZOLE 20 MG CAP: 20 | 30 days supply | Qty: 30 | Fill #1

## 2018-11-28 MED FILL — SIMVASTATIN 40 MG TABS: 40 | 90 days supply | Qty: 90 | Fill #1

## 2018-12-01 ENCOUNTER — Encounter: Payer: Self-pay | Admitting: Family Medicine

## 2018-12-06 MED FILL — INVOKAMET XR 50-1,000 MG TA: 50-1000 | 30 days supply | Qty: 60 | Fill #1

## 2018-12-20 DIAGNOSIS — G35 Multiple sclerosis: Secondary | ICD-10-CM | POA: Diagnosis not present

## 2019-01-04 ENCOUNTER — Other Ambulatory Visit: Payer: Self-pay | Admitting: Family Medicine

## 2019-01-04 MED FILL — OMEPRAZOLE 20 MG CAP: 20 | 30 days supply | Qty: 30 | Fill #0

## 2019-01-04 MED FILL — TAMSULOSIN HCL 0.4 MG CAP: 0.4 | 30 days supply | Qty: 30 | Fill #2

## 2019-01-04 MED FILL — INVOKAMET XR 50-1,000 MG TA: 50-1000 | 30 days supply | Qty: 60 | Fill #2

## 2019-01-17 DIAGNOSIS — G35 Multiple sclerosis: Secondary | ICD-10-CM | POA: Diagnosis not present

## 2019-01-23 ENCOUNTER — Telehealth: Payer: Self-pay | Admitting: *Deleted

## 2019-01-23 NOTE — Telephone Encounter (Signed)
Faxed completed/signed Tysabri pt status report and reauth questionnaire to MS touch at [phone]. Received confirmation.   Received fax back from [facility] prescribing program that pt re-authorized from 01/23/2019-08/24/2019. Pt enrollment number: [id number]. Account: [employer]. Site auth # [id number].

## 2019-02-02 ENCOUNTER — Ambulatory Visit: Payer: BC Managed Care – PPO | Admitting: Neurology

## 2019-02-02 MED FILL — INVOKAMET XR 50-1,000 MG TA: 50-1000 | 30 days supply | Qty: 60 | Fill #3

## 2019-02-02 MED FILL — TAMSULOSIN HCL 0.4 MG CAP: 0.4 | 30 days supply | Qty: 30 | Fill #3

## 2019-03-02 ENCOUNTER — Telehealth: Payer: Self-pay | Admitting: Neurology

## 2019-03-02 ENCOUNTER — Other Ambulatory Visit: Payer: Self-pay | Admitting: *Deleted

## 2019-03-02 DIAGNOSIS — Z79899 Other long term (current) drug therapy: Secondary | ICD-10-CM

## 2019-03-02 DIAGNOSIS — G35 Multiple sclerosis: Secondary | ICD-10-CM

## 2019-03-02 MED FILL — TAMSULOSIN HCL 0.4 MG CAP: 0.4 | 30 days supply | Qty: 30 | Fill #4

## 2019-03-02 MED FILL — SIMVASTATIN 40 MG TABS: 40 | 90 days supply | Qty: 90 | Fill #0

## 2019-03-02 MED FILL — LISINOPRIL 5 MG TABLET: 5 | 90 days supply | Qty: 90 | Fill #1

## 2019-03-02 MED FILL — INVOKAMET XR 50-1,000 MG TA: 50-1000 | 30 days supply | Qty: 60 | Fill #4

## 2019-03-02 NOTE — Addendum Note (Signed)
Addended by: Inis Sizer D on: 03/02/2019 03:40 PM   Modules accepted: Orders

## 2019-03-03 LAB — CBC WITH DIFFERENTIAL/PLATELET
Basophils Absolute: 0.1 10*3/uL (ref 0.0–0.2)
Basos: 1 %
EOS (ABSOLUTE): 0.5 10*3/uL — ABNORMAL HIGH (ref 0.0–0.4)
Eos: 6 %
Hematocrit: 42.7 % (ref 37.5–51.0)
Hemoglobin: 15 g/dL (ref 13.0–17.7)
Immature Grans (Abs): 0.1 10*3/uL (ref 0.0–0.1)
Immature Granulocytes: 1 %
Lymphocytes Absolute: 3.8 10*3/uL — ABNORMAL HIGH (ref 0.7–3.1)
Lymphs: 44 %
MCH: 30.5 pg (ref 26.6–33.0)
MCHC: 35.1 g/dL (ref 31.5–35.7)
MCV: 87 fL (ref 79–97)
Monocytes Absolute: 0.6 10*3/uL (ref 0.1–0.9)
Monocytes: 7 %
NRBC: 1 % — ABNORMAL HIGH (ref 0–0)
Neutrophils Absolute: 3.4 10*3/uL (ref 1.4–7.0)
Neutrophils: 41 %
Platelets: 261 10*3/uL (ref 150–450)
RBC: 4.92 x10E6/uL (ref 4.14–5.80)
RDW: 13.5 % (ref 11.6–15.4)
WBC: 8.5 10*3/uL (ref 3.4–10.8)

## 2019-03-03 MED FILL — OMEPRAZOLE 20 MG CAP: 20 | 30 days supply | Qty: 30 | Fill #1

## 2019-03-03 NOTE — Telephone Encounter (Signed)
I spoke with him during his Tysabri infusion.   He has had more weakness and numbness.  I will order an MRI of the brain and cervical spine and consider a change in therapy if there has been progression

## 2019-03-06 ENCOUNTER — Telehealth: Payer: Self-pay | Admitting: *Deleted

## 2019-03-06 NOTE — Telephone Encounter (Signed)
JCV ab drawn on 03/02/19 received and being processed. Faxed back clarification needed for specimen today. Results pending.

## 2019-03-14 ENCOUNTER — Telehealth: Payer: Self-pay | Admitting: Neurology

## 2019-03-14 NOTE — Telephone Encounter (Signed)
LVM for pt to call back about scheduling mri  [employer] Auth: [id number] (exp. 03/09/19 to 09/04/19)

## 2019-03-14 NOTE — Telephone Encounter (Signed)
JCV ab drawn on 03/02/19 positive, index: 0.61

## 2019-03-14 NOTE — Telephone Encounter (Signed)
Patient called back in regards to missed call  

## 2019-03-15 MED ORDER — ALPRAZOLAM 0.5 MG PO TABS: ORAL_TABLET | ORAL | 0 refills | Status: DC

## 2019-03-15 MED FILL — ALPRAZolam 0.5 MG TABS: 0.5 | 1 days supply | Qty: 3 | Fill #0

## 2019-03-15 NOTE — Telephone Encounter (Signed)
Patient also informed me he is claustrophobic and will need something to help him. He is aware to have a driver.

## 2019-03-15 NOTE — Telephone Encounter (Signed)
I spoke to the patient he is scheduled at [facility] for 03/21/19.

## 2019-03-15 NOTE — Addendum Note (Signed)
Addended by: Wyvonnia Lora on: 03/15/2019 10:17 AM   Modules accepted: Orders

## 2019-03-15 NOTE — Addendum Note (Signed)
Addended by: Arlice Colt A on: 03/15/2019 11:30 AM   Modules accepted: Orders

## 2019-03-21 ENCOUNTER — Ambulatory Visit (INDEPENDENT_AMBULATORY_CARE_PROVIDER_SITE_OTHER): Payer: BC Managed Care – PPO

## 2019-03-21 ENCOUNTER — Other Ambulatory Visit: Payer: Self-pay

## 2019-03-21 ENCOUNTER — Telehealth: Payer: Self-pay | Admitting: Neurology

## 2019-03-21 DIAGNOSIS — G35 Multiple sclerosis: Secondary | ICD-10-CM | POA: Diagnosis not present

## 2019-03-21 MED ORDER — GADOBENATE DIMEGLUMINE 529 MG/ML IV SOLN
15.0000 mL | Freq: Once | INTRAVENOUS | Status: AC | PRN
  Administered 2019-03-21: 15 mL via INTRAVENOUS

## 2019-03-21 NOTE — Telephone Encounter (Signed)
I spoke to [name].  The JCV antibody came back low positive (0.61) it has been negative in the past.  Because of that test, the risk of PML is about 1: 2000 while it was better than 1: 10,000 while negative.  Some studies have shown that the extended interval dosing (changing from every 4 weeks to every 6 weeks) may reduce the risk of PML.  Therefore, we will make that change.  If he does convert to middle or high positive, we will need to reconsider staying on Tysabri or switching to a different medication.  He will have his MRIs later today.

## 2019-03-23 ENCOUNTER — Telehealth: Payer: Self-pay | Admitting: *Deleted

## 2019-03-23 NOTE — Telephone Encounter (Signed)
Called, left detailed message about MRI results. Asked him to call back if he has any further questions. Provided office number.

## 2019-03-23 NOTE — Telephone Encounter (Signed)
-----   Message from Britt Bottom, MD sent at 03/23/2019 12:46 PM EST ----- Please let him know that the MRI of the brain and spinal cord did not show any brain new MS lesions.  I compared the brain MRI to the one he had in 2019.  It actually looks a little better as the one brand-new lesion in 2019 is smaller on the current study.  Other MS lesions look the same.  He does have a disc protrusion at C5-C6 that is right next to the C6 nerve root but not bad enough to recommend surgery

## 2019-03-28 ENCOUNTER — Telehealth: Payer: Self-pay | Admitting: Neurology

## 2019-03-28 NOTE — Telephone Encounter (Signed)
Pt would like a call about scheduling an infusion

## 2019-03-28 NOTE — Telephone Encounter (Signed)
Called pt back. He is needing to get Tysabri r/s. Needing to go from q4 wks to q6wk. Placed him on hold and spoke with Lovena Le in infusion suite. She had previously LVM for him offering 04/13/19 at 1:30pm instead of 03/30/19 at 1:30pm. I relayed this to him. He accepted appt on 04/13/19 for infusion. Reminded him that he has f/u 04/06/19 at 4pm with Dr. Felecia Shelling. He verbalized understanding.

## 2019-04-04 ENCOUNTER — Other Ambulatory Visit: Payer: Self-pay | Admitting: Family Medicine

## 2019-04-04 MED FILL — TAMSULOSIN HCL 0.4 MG CAP: 0.4 | 30 days supply | Qty: 30 | Fill #5

## 2019-04-04 MED FILL — INVOKAMET XR 50-1,000 MG TA: 50-1000 | 30 days supply | Qty: 60 | Fill #5

## 2019-04-04 MED FILL — OMEPRAZOLE 20 MG CAP: 20 | 90 days supply | Qty: 90 | Fill #0

## 2019-04-06 ENCOUNTER — Ambulatory Visit: Payer: BC Managed Care – PPO | Admitting: Neurology

## 2019-04-07 ENCOUNTER — Ambulatory Visit: Payer: BC Managed Care – PPO | Attending: Internal Medicine

## 2019-04-07 DIAGNOSIS — Z23 Encounter for immunization: Secondary | ICD-10-CM

## 2019-04-07 NOTE — Progress Notes (Signed)
   Covid-19 Vaccination Clinic  Name:  [name]    MRN: [id number] DOB: 03-Apr-1970  04/07/2019  [name] was observed post Covid-19 immunization for 15 minutes without incident. He was provided with Vaccine Information Sheet and instruction to access the V-Safe system.   [name] was instructed to call 911 with any severe reactions post vaccine: Marland Kitchen Difficulty breathing  . Swelling of face and throat  . A fast heartbeat  . A bad rash all over body  . Dizziness and weakness   Immunizations Administered    Name Date Dose VIS Date Route   Pfizer COVID-19 Vaccine 04/07/2019 12:40 PM 0.3 mL 12/30/2018 Intramuscular   Manufacturer: [place]   Lot: [id number]   [place]: [id number]

## 2019-04-13 DIAGNOSIS — G35 Multiple sclerosis: Secondary | ICD-10-CM | POA: Diagnosis not present

## 2019-04-25 ENCOUNTER — Ambulatory Visit (INDEPENDENT_AMBULATORY_CARE_PROVIDER_SITE_OTHER): Payer: BC Managed Care – PPO | Admitting: Neurology

## 2019-04-25 ENCOUNTER — Other Ambulatory Visit: Payer: Self-pay

## 2019-04-25 ENCOUNTER — Encounter: Payer: Self-pay | Admitting: Neurology

## 2019-04-25 VITALS — BP 110/65 | HR 96 | Temp 97.3°F | Ht 71.0 in | Wt 172.5 lb

## 2019-04-25 DIAGNOSIS — R3911 Hesitancy of micturition: Secondary | ICD-10-CM

## 2019-04-25 DIAGNOSIS — Z79899 Other long term (current) drug therapy: Secondary | ICD-10-CM

## 2019-04-25 DIAGNOSIS — G35 Multiple sclerosis: Secondary | ICD-10-CM

## 2019-04-25 DIAGNOSIS — R269 Unspecified abnormalities of gait and mobility: Secondary | ICD-10-CM | POA: Diagnosis not present

## 2019-04-25 DIAGNOSIS — G9389 Other specified disorders of brain: Secondary | ICD-10-CM

## 2019-04-25 DIAGNOSIS — F09 Unspecified mental disorder due to known physiological condition: Secondary | ICD-10-CM

## 2019-04-25 NOTE — Progress Notes (Signed)
[name]  PATIENT: [name] DOB: 08-27-1970  REFERRING DOCTOR OR PCP:  [name] SOURCE: patient, notes from Dr. Brett Bernard, imaging/lab reports and MRI images were personally reviewed  _________________________________   HISTORICAL  CHIEF COMPLAINT:  Chief Complaint  Patient presents with  . Follow-up    RM 12 with wife (temp:97.3). Last seen 08/02/2018. Having more right sided numbness. Denies any falls recently.   . Multiple Sclerosis    On Tysabri. Receiving q 6 wks. Last infusion 04/13/19. Next one: 05/24/19. Last JCV 03/02/19 positive, index: 0.61. Receives at [facility] with intrafusion.     HISTORY OF PRESENT ILLNESS:  [name] is a 49 y.o. man with relapsing remitting multiple sclerosis diagnosed in 1999.     Update 04/25/2019: He is on Tysabri and tolerates it well.  His next infusion will be in a monthHe is JCV Ab low positive  And has been getting Tysabri He denies any exacerbation but has noted a stuffed up sensation in his right ear like water in his ear.    He also notes more difficulty with the right side.    He needs to be careful with stairs over the past year, especially downstairs and now holds the bannister.   He feels weaker on the right which makes going back and forth between gas and brakes seems harder.   He feels there is bladder and bowel numbness - he does not know when he needs to go and then has little warning.  He has no incontinence.  He has a large prostate and is on tamsulosin and tadalafil.  He has hesitancy but thinks he empties.  He has seen urology.   Vision is doing about the same.    He is having more cognitive issues and tasks that used to be easy are more difficult, like talking for a longer time with a client and helping kids with homework. He feels focus is worse.   He notes fatigue as well.     He had his first Covid 19 vaccination 2 weeks ago and is scheduled for his next one I in a week.    Update  08/02/2018: He was diagnosed with MS in 1999.    He feels his relapsing remitting MS has been stable.   He is on Tysabri since January 2020.  The JCV was negative (0.33 and inhibition study performed).    He feels his gait is mildly off, especially shortly after standing up and with going down steps.    He holds the bannister going down but not up.    He has no falls.  He denies much weakness.  However, he feels he fatigues easily.     He denies numbness and has tingling in the right hand now and then if he sustains certain positions long enough.   Bladder function is ok with mild urgency but no incontinence or retention.   He has ED.   Vision is doing well (wears reading glasses.    Color vision is fine.   He slurs his words at time  He has physical = mental fatigue and does worse.  He tried Adderall a couple days and did not note any improvement and had a headache.  He was felt to have some depression and was started on Prozac.  He stopped after feeling no better after a month. He also has some anxieties and is worried about his future.   He sometimes has trouble coming up with the right words.  He sleeps ok most nights.   He dozes off 5/7 nights while watching TV.   He does not snore much and no one has noticed OSA signs.     Update 03/10/2018: He is on Tysabri with his first dose a few weeks ago.  He had no issues with the infusion.      He started to notice left hand numbness that started last Thursday.   The left leg also felt slightly weaker the last week.   He also noted more trouble with his balance last week.   He still has more numbness and weakness on his right.      He notes more difficulty with bladder and bowel urgency.    He also has an enlarged prostate and also has some hesitancy.      Cognitive issues seem worse over the past year.    From 11/18/2017: I had the pleasure of seeing your patient, [name], at the [facility] at [facility] for consultation  regarding the possibility of MS and disease modifying therapy options  In 1999, he recalls having a GI virus and waking up the next day with numbness in his right arm.  He was referred to Dr. Marijean Bernard in [facility]   He had an LP showing elevated OCB and he was officially diagnosed with RRMS.    He started seeing Dr. Hart Bernard at [facility] for a few years.  He was placed on Avonex and then Copaxone.   He tolerated the medications ok but did not note any improvements afterwards. He transferred care to Dr. Trula Bernard who questioned the diagnosis of MS.   He stopped taking any DMT.   This July, on a hot day, he noticed tingling in his fingertips.  The next morning he woke up with right sided weakness and clumsiness in the right leg and numbness in the leg and arm.   He has not had much improvement since the onset of the new symptoms.  He saw Dr. Brett Bernard and had a repeat LP.  It showed OCB in the CSF and serum.   No change in walking afterwards.    Currently, straight line walking is ok but he stumbles with turns and upon his first few steps or on an uneven surface.     The right legg still feels mildly weak and clumsy.  Arms are fine.    He has urinary hesitancy and frequency.    He does fully empty by [place].  He also has an enlarged prostate.   Combination of Flomax and Cialis has helped.    He has fatigue, worse in heat.   He has insomnia due t problems getting comfortable and difficulty falling back asleep.   He had depression and anxiety earlier this year but has since improved.   There is mild anxiety.   He works as a Naval architect fluency issues.  He has reduced focus attention with some processing issues and reduced short term memory at times.     He has well controlled Type 2 DM, first treated with insulin and now with Invokana after weight loss and diet changes.     I personally reviewed the MRI images of the brain and cervical spine from 09/18/2017.   The MRI of the brain shows  multiple T2/FLAIR hyperintense foci, mostly in the periventricular white matter radially oriented to the ventricles.  There are no lesions in the brainstem or cerebellum.  One small focus in the left  parietal lobe enhanced after contrast.  There is generalized atrophy but the ventricles appear to be enlarged, mildly out of proportion to the extent of atrophy.  The MRI of the cervical spine showed 5-6 T2 hyperintense foci, most of which were posterolateral and none enhancing.  CSF 10/27/2017 showed > 5 oligoclonal bands in CSF and additional bands in CSF and serum.   Protein was markedly elevated at 119.   Glucose slightly elevated (diabetic).  WBC elevated at 31 with 92% lymphs.   REVIEW OF SYSTEMS: Constitutional: No fevers, chills, sweats, or change in appetite.  He has fatigue.  There is some insomnia. Eyes: No visual changes, double vision, eye pain Ear, nose and throat: No hearing loss, ear pain, nasal congestion, sore throat Cardiovascular: No chest pain, palpitations Respiratory: No shortness of breath at rest or with exertion.   No wheezes GastrointestinaI: No nausea, vomiting, diarrhea, abdominal pain, fecal incontinence Genitourinary: No dysuria, urinary retention or frequency.  No nocturia. Musculoskeletal: No neck pain, back pain Integumentary: No rash, pruritus, skin lesions Neurological: as above Psychiatric: No depression at this time though he had some a few months ago.  No anxiety Endocrine: No palpitations, diaphoresis, change in appetite, change in weigh or increased thirst Hematologic/Lymphatic: No anemia, purpura, petechiae. Allergic/Immunologic: No itchy/runny eyes, nasal congestion, recent allergic reactions, rashes  ALLERGIES: No Known Allergies  HOME MEDICATIONS:  Current Outpatient Medications:  .  aspirin EC 81 MG tablet, Take 81 mg by mouth daily., Disp: , Rfl:  .  Canagliflozin-metFORMIN HCl ER (INVOKAMET XR) 50-1000 MG TB24, Take 1 tablet by mouth daily.,  Disp: , Rfl:  .  lisinopril (PRINIVIL,ZESTRIL) 5 MG tablet, , Disp: , Rfl: 2 .  natalizumab (TYSABRI) 300 MG/15ML injection, Inject 300 mg into the vein every 30 (thirty) days. Start form provided to Intrafusion on 12/06/17., Disp: , Rfl:  .  omeprazole (PRILOSEC) 20 MG capsule, TAKE 1 CAPSULE BY MOUTH ONCE DAILY, Disp: 90 capsule, Rfl: 1 .  simvastatin (ZOCOR) 40 MG tablet, TAKE 1 TABLET BY MOUTH DAILY AT 6:00 PM **NEED LABS DRAWN** (Patient taking differently: Take 40 mg by mouth daily at 6 PM. ), Disp: 15 tablet, Rfl: 0 .  tadalafil (CIALIS) 5 MG tablet, Take 5 mg by mouth daily as needed for erectile dysfunction., Disp: , Rfl:  .  tamsulosin (FLOMAX) 0.4 MG CAPS capsule, Take 0.4 mg by mouth., Disp: , Rfl:   PAST MEDICAL HISTORY: Past Medical History:  Diagnosis Date  . Diabetes mellitus without complication ([place])   . Hypertension   . Multiple sclerosis (HCC) 1999   Dr [name]  . Nephrolithiasis    sees urology    PAST SURGICAL HISTORY: Past Surgical History:  Procedure Laterality Date  . APPENDECTOMY  2003  . LITHOTRIPSY      FAMILY HISTORY: Family History  Problem Relation Age of Onset  . Hypertension Mother   . Alcohol abuse Father   . Cancer Maternal Grandfather   . Diabetes Maternal Aunt     SOCIAL HISTORY:  Social History   Socioeconomic History  . Marital status: Married    Spouse name: christy  . Number of children: 2  . Years of education: Not on file  . Highest education level: Not on file  Occupational History  . Occupation: IT    Comment: works in Personal assistant  Tobacco Use  . Smoking status: Former Smoker    Packs/day: 1.00    Types: Cigarettes    Quit date: 10/28/2011  Years since quitting: 7.4  . Smokeless tobacco: Never Used  Substance and Sexual Activity  . Alcohol use: Yes    Comment: sometimes   . Drug use: Yes    Types: Marijuana    Comment: Weekly  . Sexual activity: Yes  Other Topics Concern  . Not on file  Social History  Narrative   Right handed    Caffeine use: Diet soda   Social Determinants of Health   Financial Resource Strain:   . Difficulty of Paying Living Expenses:   Food Insecurity:   . Worried About Charity fundraiser in the Last Year:   . Arboriculturist in the Last Year:   Transportation Needs:   . Film/video editor (Medical):   Marland Kitchen Lack of Transportation (Non-Medical):   Physical Activity:   . Days of Exercise per Week:   . Minutes of Exercise per Session:   Stress:   . Feeling of Stress :   Social Connections:   . Frequency of Communication with Friends and Family:   . Frequency of Social Gatherings with Friends and Family:   . Attends Religious Services:   . Active Member of Clubs or Organizations:   . Attends Archivist Meetings:   Marland Kitchen Marital Status:   Intimate Partner Violence:   . Fear of Current or Ex-Partner:   . Emotionally Abused:   Marland Kitchen Physically Abused:   . Sexually Abused:      PHYSICAL EXAM  Vitals:   04/25/19 1454  BP: 110/65  Pulse: 96  Temp: (!) 97.3 F (36.3 C)  SpO2: 98%  Weight: 172 lb 8 oz (78.2 kg)  Height: 5\' 11"  (1.803 m)    Body mass index is 24.06 kg/m.   General: The patient is well-developed and well-nourished and in no acute distress   Skin: Extremities are without rash or edema.   Neurologic Exam  Mental status: The patient is alert and oriented x 3 at the time of the examination. The patient has apparent normal recent and remote memory, with an apparently normal attention span and concentration ability.   Speech is normal.  Cranial nerves: Extraocular movements are full.  Color vision is symmetric.  Facial strength and sensation was normal.  Trapezius strength was normal. No obvious hearing deficits are noted.  Motor:  Muscle bulk is normal.  Muscle tone is increased in the right leg.  Strength was 5/5 except 4+/5 in the right EHL.  Sensory: Sensory testing shows reduced sensation to vibration and temperature in the  right leg.   Normal in arm  Coordination: Cerebellar testing reveals good finger-nose-finger and slightly right heel-to-shin .  Gait and station: Station is normal.   Gait is mildly wide and mildly spastic on the right.  The tandem gait is mildly wide .  He can turn 180 degrees in 2-3 steps.  Stride was fairly normal.  Romberg is negative.  Reflexes: Deep tendon reflexes are increased, legs more than arms.  There are cross abductor reflexes at the knees and nonsustained clonus at the ankles.    DIAGNOSTIC DATA (LABS, IMAGING, TESTING) - I reviewed patient records, labs, notes, testing and imaging myself where available.  Lab Results  Component Value Date   WBC 8.5 03/02/2019   HGB 15.0 03/02/2019   HCT 42.7 03/02/2019   MCV 87 03/02/2019   PLT 261 03/02/2019      Component Value Date/Time   NA 139 10/24/2018 1403   K 4.1 10/24/2018 1403  CL 102 10/24/2018 1403   CO2 26 10/24/2018 1403   GLUCOSE 90 10/24/2018 1403   BUN 13 10/24/2018 1403   CREATININE 0.75 10/24/2018 1403   CALCIUM 10.2 10/24/2018 1403   PROT 7.4 10/24/2018 1403   PROT 7.5 11/18/2017 1023   ALBUMIN 5.2 11/18/2017 1023   AST 14 10/24/2018 1403   ALT 16 10/24/2018 1403   ALKPHOS 65 11/18/2017 1023   BILITOT 0.6 10/24/2018 1403   BILITOT 0.4 11/18/2017 1023   GFRNONAA 109 10/24/2018 1403   GFRAA 127 10/24/2018 1403   Lab Results  Component Value Date   CHOL 178 10/24/2018   HDL 29 (L) 10/24/2018   LDLCALC 109 (H) 10/24/2018   LDLDIRECT 111 (H) 02/15/2014   TRIG 282 (H) 10/24/2018   CHOLHDL 6.1 (H) 10/24/2018   Lab Results  Component Value Date   HGBA1C 7.0 (H) 10/24/2018      ASSESSMENT AND PLAN  1. Multiple sclerosis ([place])   2. Cognitive deficit secondary to multiple sclerosis (HCC)   3. Cerebral ventriculomegaly   4. Gait disturbance   5. High risk medication use   6. Urinary hesitancy     1.   Continue Tysabri for relapsing remitting MS.   He is now JCV low positive.   We will check  again in 3 months --- if climbs above a titrer of 1.0, consider a change in medication.   O/w continue Tysabri q 6 weeks 2.  Continue tamsulosin for bladder hesitancy.  Consider adding low dose oxybutynin 3.   Stay active and exercise as tolerated. 4.   There is stable mild ventriculomegaly on imaging studies.  Pain is more likely that this is due to MS related atrophy and then to normal pressure hydrocephalus as the temporal horns are not particularly enlarged.  Additionally, he has a good stride even though gait is off due to the right leg issues.  5.   He will return to see me in 6 months or sooner if there are new or worsening neurologic symptoms.  45-minute office visit with the majority of the time spent face-to-face for history and physical, discussion/counseling and decision-making.  Long discussion about relapsing RRMS vs active SPMS.  Additional time with record review and documentation.   Auryn Paige A. Felecia Shelling, MD, [facility] [id number], [id number] PM Certified in Neurology, Clinical Neurophysiology, Sleep Medicine, Pain Medicine and Neuroimaging  [facility] [address] [place], [place] [address] [phone]

## 2019-05-02 ENCOUNTER — Ambulatory Visit: Payer: BC Managed Care – PPO | Attending: Internal Medicine

## 2019-05-02 DIAGNOSIS — Z23 Encounter for immunization: Secondary | ICD-10-CM

## 2019-05-02 NOTE — Progress Notes (Signed)
   Covid-19 Vaccination Clinic  Name:  [name]    MRN: [id number] DOB: 02-07-1970  05/02/2019  [name] was observed post Covid-19 immunization for 15 minutes without incident. He was provided with Vaccine Information Sheet and instruction to access the V-Safe system.   [name] was instructed to call 911 with any severe reactions post vaccine: Marland Kitchen Difficulty breathing  . Swelling of face and throat  . A fast heartbeat  . A bad rash all over body  . Dizziness and weakness   Immunizations Administered    Name Date Dose VIS Date Route   Pfizer COVID-19 Vaccine 05/02/2019  1:28 PM 0.3 mL 12/30/2018 Intramuscular   Manufacturer: [place]   Lot: [id number]   [place]: [id number]

## 2019-05-10 MED FILL — TAMSULOSIN HCL 0.4 MG CAP: 0.4 | 30 days supply | Qty: 30 | Fill #6

## 2019-05-11 MED FILL — INVOKAMET XR 50-1,000 MG TA: 50-1000 | 30 days supply | Qty: 60 | Fill #0

## 2019-05-30 DIAGNOSIS — G35 Multiple sclerosis: Secondary | ICD-10-CM | POA: Diagnosis not present

## 2019-06-07 MED FILL — LISINOPRIL 5 MG TABLET: 5 | 90 days supply | Qty: 90 | Fill #0

## 2019-06-07 MED FILL — INVOKAMET XR 50-1,000 MG TA: 50-1000 | 30 days supply | Qty: 60 | Fill #1

## 2019-06-07 MED FILL — TAMSULOSIN HCL 0.4 MG CAP: 0.4 | 30 days supply | Qty: 30 | Fill #7

## 2019-06-07 MED FILL — SIMVASTATIN 40 MG TABS: 40 | 90 days supply | Qty: 90 | Fill #1

## 2019-07-10 MED FILL — OMEPRAZOLE DR 20 MG CAPSULE: 20 | 90 days supply | Qty: 90 | Fill #1

## 2019-07-10 MED FILL — TAMSULOSIN HCL 0.4 MG CAP: 0.4 | 30 days supply | Qty: 30 | Fill #8

## 2019-07-11 DIAGNOSIS — G35 Multiple sclerosis: Secondary | ICD-10-CM | POA: Diagnosis not present

## 2019-07-11 MED FILL — INVOKAMET XR 50-1,000 MG TA: 50-1000 | 30 days supply | Qty: 60 | Fill #2

## 2019-07-13 DIAGNOSIS — M9902 Segmental and somatic dysfunction of thoracic region: Secondary | ICD-10-CM | POA: Diagnosis not present

## 2019-07-13 DIAGNOSIS — M542 Cervicalgia: Secondary | ICD-10-CM | POA: Diagnosis not present

## 2019-07-13 DIAGNOSIS — M546 Pain in thoracic spine: Secondary | ICD-10-CM | POA: Diagnosis not present

## 2019-07-13 DIAGNOSIS — M9901 Segmental and somatic dysfunction of cervical region: Secondary | ICD-10-CM | POA: Diagnosis not present

## 2019-07-18 DIAGNOSIS — M542 Cervicalgia: Secondary | ICD-10-CM | POA: Diagnosis not present

## 2019-07-18 DIAGNOSIS — M9901 Segmental and somatic dysfunction of cervical region: Secondary | ICD-10-CM | POA: Diagnosis not present

## 2019-07-18 DIAGNOSIS — M9902 Segmental and somatic dysfunction of thoracic region: Secondary | ICD-10-CM | POA: Diagnosis not present

## 2019-07-18 DIAGNOSIS — M546 Pain in thoracic spine: Secondary | ICD-10-CM | POA: Diagnosis not present

## 2019-07-20 DIAGNOSIS — M9902 Segmental and somatic dysfunction of thoracic region: Secondary | ICD-10-CM | POA: Diagnosis not present

## 2019-07-20 DIAGNOSIS — M542 Cervicalgia: Secondary | ICD-10-CM | POA: Diagnosis not present

## 2019-07-20 DIAGNOSIS — M9901 Segmental and somatic dysfunction of cervical region: Secondary | ICD-10-CM | POA: Diagnosis not present

## 2019-07-20 DIAGNOSIS — M546 Pain in thoracic spine: Secondary | ICD-10-CM | POA: Diagnosis not present

## 2019-07-25 ENCOUNTER — Telehealth: Payer: Self-pay | Admitting: *Deleted

## 2019-07-25 ENCOUNTER — Other Ambulatory Visit: Payer: Self-pay | Admitting: *Deleted

## 2019-07-25 DIAGNOSIS — G35 Multiple sclerosis: Secondary | ICD-10-CM

## 2019-07-25 DIAGNOSIS — M9901 Segmental and somatic dysfunction of cervical region: Secondary | ICD-10-CM | POA: Diagnosis not present

## 2019-07-25 DIAGNOSIS — M542 Cervicalgia: Secondary | ICD-10-CM | POA: Diagnosis not present

## 2019-07-25 DIAGNOSIS — M9902 Segmental and somatic dysfunction of thoracic region: Secondary | ICD-10-CM | POA: Diagnosis not present

## 2019-07-25 DIAGNOSIS — Z79899 Other long term (current) drug therapy: Secondary | ICD-10-CM

## 2019-07-25 DIAGNOSIS — M546 Pain in thoracic spine: Secondary | ICD-10-CM | POA: Diagnosis not present

## 2019-07-25 NOTE — Telephone Encounter (Signed)
Faxed completed/signed Tysabri pt status report and reauth questionnaire to [facility] touch at [phone]. Received confirmation.   Received fax notification back from touch program that pt re-auth for Tysabri from 07/25/19-02/23/20.  Pt enrollment number: [id number]. Account: [facility]. Site auth number: [id number]

## 2019-07-25 NOTE — Progress Notes (Signed)
Pt due for labs at next infusion scheduled for 08/22/19. Future orders placed for JCV.CBC. Informed infusion suite, they placed reminder on their end as well.

## 2019-07-27 DIAGNOSIS — M546 Pain in thoracic spine: Secondary | ICD-10-CM | POA: Diagnosis not present

## 2019-07-27 DIAGNOSIS — M9902 Segmental and somatic dysfunction of thoracic region: Secondary | ICD-10-CM | POA: Diagnosis not present

## 2019-07-27 DIAGNOSIS — M542 Cervicalgia: Secondary | ICD-10-CM | POA: Diagnosis not present

## 2019-07-27 DIAGNOSIS — M9901 Segmental and somatic dysfunction of cervical region: Secondary | ICD-10-CM | POA: Diagnosis not present

## 2019-08-01 DIAGNOSIS — M542 Cervicalgia: Secondary | ICD-10-CM | POA: Diagnosis not present

## 2019-08-01 DIAGNOSIS — M9901 Segmental and somatic dysfunction of cervical region: Secondary | ICD-10-CM | POA: Diagnosis not present

## 2019-08-01 DIAGNOSIS — M9902 Segmental and somatic dysfunction of thoracic region: Secondary | ICD-10-CM | POA: Diagnosis not present

## 2019-08-01 DIAGNOSIS — M546 Pain in thoracic spine: Secondary | ICD-10-CM | POA: Diagnosis not present

## 2019-08-03 DIAGNOSIS — M9902 Segmental and somatic dysfunction of thoracic region: Secondary | ICD-10-CM | POA: Diagnosis not present

## 2019-08-03 DIAGNOSIS — M542 Cervicalgia: Secondary | ICD-10-CM | POA: Diagnosis not present

## 2019-08-03 DIAGNOSIS — M546 Pain in thoracic spine: Secondary | ICD-10-CM | POA: Diagnosis not present

## 2019-08-03 DIAGNOSIS — M9901 Segmental and somatic dysfunction of cervical region: Secondary | ICD-10-CM | POA: Diagnosis not present

## 2019-08-15 MED FILL — TAMSULOSIN HCL 0.4 MG CAP: 0.4 | 30 days supply | Qty: 30 | Fill #9

## 2019-08-15 MED FILL — INVOKAMET XR 50-1,000 MG TA: 50-1000 | 30 days supply | Qty: 60 | Fill #3

## 2019-08-17 DIAGNOSIS — M546 Pain in thoracic spine: Secondary | ICD-10-CM | POA: Diagnosis not present

## 2019-08-17 DIAGNOSIS — M9901 Segmental and somatic dysfunction of cervical region: Secondary | ICD-10-CM | POA: Diagnosis not present

## 2019-08-17 DIAGNOSIS — M9902 Segmental and somatic dysfunction of thoracic region: Secondary | ICD-10-CM | POA: Diagnosis not present

## 2019-08-17 DIAGNOSIS — M542 Cervicalgia: Secondary | ICD-10-CM | POA: Diagnosis not present

## 2019-08-22 DIAGNOSIS — M9901 Segmental and somatic dysfunction of cervical region: Secondary | ICD-10-CM | POA: Diagnosis not present

## 2019-08-22 DIAGNOSIS — M546 Pain in thoracic spine: Secondary | ICD-10-CM | POA: Diagnosis not present

## 2019-08-22 DIAGNOSIS — M542 Cervicalgia: Secondary | ICD-10-CM | POA: Diagnosis not present

## 2019-08-22 DIAGNOSIS — M9902 Segmental and somatic dysfunction of thoracic region: Secondary | ICD-10-CM | POA: Diagnosis not present

## 2019-09-05 DIAGNOSIS — M546 Pain in thoracic spine: Secondary | ICD-10-CM | POA: Diagnosis not present

## 2019-09-05 DIAGNOSIS — M9902 Segmental and somatic dysfunction of thoracic region: Secondary | ICD-10-CM | POA: Diagnosis not present

## 2019-09-05 DIAGNOSIS — M542 Cervicalgia: Secondary | ICD-10-CM | POA: Diagnosis not present

## 2019-09-05 DIAGNOSIS — M9901 Segmental and somatic dysfunction of cervical region: Secondary | ICD-10-CM | POA: Diagnosis not present

## 2019-09-07 DIAGNOSIS — M546 Pain in thoracic spine: Secondary | ICD-10-CM | POA: Diagnosis not present

## 2019-09-07 DIAGNOSIS — M9902 Segmental and somatic dysfunction of thoracic region: Secondary | ICD-10-CM | POA: Diagnosis not present

## 2019-09-07 DIAGNOSIS — M542 Cervicalgia: Secondary | ICD-10-CM | POA: Diagnosis not present

## 2019-09-07 DIAGNOSIS — M9901 Segmental and somatic dysfunction of cervical region: Secondary | ICD-10-CM | POA: Diagnosis not present

## 2019-09-12 ENCOUNTER — Other Ambulatory Visit (HOSPITAL_BASED_OUTPATIENT_CLINIC_OR_DEPARTMENT_OTHER): Payer: Self-pay | Admitting: *Deleted

## 2019-09-12 DIAGNOSIS — M542 Cervicalgia: Secondary | ICD-10-CM | POA: Diagnosis not present

## 2019-09-12 DIAGNOSIS — M546 Pain in thoracic spine: Secondary | ICD-10-CM | POA: Diagnosis not present

## 2019-09-12 DIAGNOSIS — M9901 Segmental and somatic dysfunction of cervical region: Secondary | ICD-10-CM | POA: Diagnosis not present

## 2019-09-12 DIAGNOSIS — M9902 Segmental and somatic dysfunction of thoracic region: Secondary | ICD-10-CM | POA: Diagnosis not present

## 2019-09-12 MED FILL — TAMSULOSIN HCL 0.4 MG CAP: 0.4 | 30 days supply | Qty: 30 | Fill #10

## 2019-09-12 MED FILL — LISINOPRIL 5 MG TABLET: 5 | 90 days supply | Qty: 90 | Fill #1

## 2019-09-12 MED FILL — SIMVASTATIN 40 MG TABS: 40 | 90 days supply | Qty: 90 | Fill #0

## 2019-09-12 MED FILL — INVOKAMET XR 50-1,000 MG TA: 50-1000 | 30 days supply | Qty: 60 | Fill #4

## 2019-09-14 DIAGNOSIS — M9902 Segmental and somatic dysfunction of thoracic region: Secondary | ICD-10-CM | POA: Diagnosis not present

## 2019-09-14 DIAGNOSIS — M546 Pain in thoracic spine: Secondary | ICD-10-CM | POA: Diagnosis not present

## 2019-09-14 DIAGNOSIS — M542 Cervicalgia: Secondary | ICD-10-CM | POA: Diagnosis not present

## 2019-09-14 DIAGNOSIS — M9901 Segmental and somatic dysfunction of cervical region: Secondary | ICD-10-CM | POA: Diagnosis not present

## 2019-09-19 DIAGNOSIS — M9901 Segmental and somatic dysfunction of cervical region: Secondary | ICD-10-CM | POA: Diagnosis not present

## 2019-09-19 DIAGNOSIS — M546 Pain in thoracic spine: Secondary | ICD-10-CM | POA: Diagnosis not present

## 2019-09-19 DIAGNOSIS — M9902 Segmental and somatic dysfunction of thoracic region: Secondary | ICD-10-CM | POA: Diagnosis not present

## 2019-09-19 DIAGNOSIS — M542 Cervicalgia: Secondary | ICD-10-CM | POA: Diagnosis not present

## 2019-09-26 DIAGNOSIS — M542 Cervicalgia: Secondary | ICD-10-CM | POA: Diagnosis not present

## 2019-09-26 DIAGNOSIS — M9901 Segmental and somatic dysfunction of cervical region: Secondary | ICD-10-CM | POA: Diagnosis not present

## 2019-09-26 DIAGNOSIS — M9902 Segmental and somatic dysfunction of thoracic region: Secondary | ICD-10-CM | POA: Diagnosis not present

## 2019-09-26 DIAGNOSIS — M546 Pain in thoracic spine: Secondary | ICD-10-CM | POA: Diagnosis not present

## 2019-09-27 ENCOUNTER — Telehealth: Payer: Self-pay | Admitting: *Deleted

## 2019-09-27 DIAGNOSIS — G35 Multiple sclerosis: Secondary | ICD-10-CM | POA: Diagnosis not present

## 2019-09-27 DIAGNOSIS — Z79899 Other long term (current) drug therapy: Secondary | ICD-10-CM | POA: Diagnosis not present

## 2019-09-27 NOTE — Addendum Note (Signed)
Addended by: Inis Sizer D on: 09/27/2019 03:36 PM   Modules accepted: Orders

## 2019-09-27 NOTE — Telephone Encounter (Signed)
Placed [facility] lab in [facility] lock box for routine lab pick up. Results pending. 

## 2019-09-28 LAB — CBC WITH DIFFERENTIAL/PLATELET
Basophils Absolute: 0.1 10*3/uL (ref 0.0–0.2)
Basos: 1 %
EOS (ABSOLUTE): 1.7 10*3/uL — ABNORMAL HIGH (ref 0.0–0.4)
Eos: 21 %
Hematocrit: 40.5 % (ref 37.5–51.0)
Hemoglobin: 14.1 g/dL (ref 13.0–17.7)
Immature Grans (Abs): 0 10*3/uL (ref 0.0–0.1)
Immature Granulocytes: 0 %
Lymphocytes Absolute: 2.9 10*3/uL (ref 0.7–3.1)
Lymphs: 36 %
MCH: 29.7 pg (ref 26.6–33.0)
MCHC: 34.8 g/dL (ref 31.5–35.7)
MCV: 85 fL (ref 79–97)
Monocytes Absolute: 0.6 10*3/uL (ref 0.1–0.9)
Monocytes: 7 %
Neutrophils Absolute: 2.9 10*3/uL (ref 1.4–7.0)
Neutrophils: 35 %
Platelets: 226 10*3/uL (ref 150–450)
RBC: 4.75 x10E6/uL (ref 4.14–5.80)
RDW: 13.8 % (ref 11.6–15.4)
WBC: 8.2 10*3/uL (ref 3.4–10.8)

## 2019-10-04 IMAGING — DX DG LUMBAR SPINE COMPLETE 4+V
5 series · 5 of 5 positions shown · non-contrast
Comparison: None.

CLINICAL DATA: Right leg weakness, pain.

EXAM:
LUMBAR SPINE - COMPLETE 4+ VIEW

[l-spine ap]
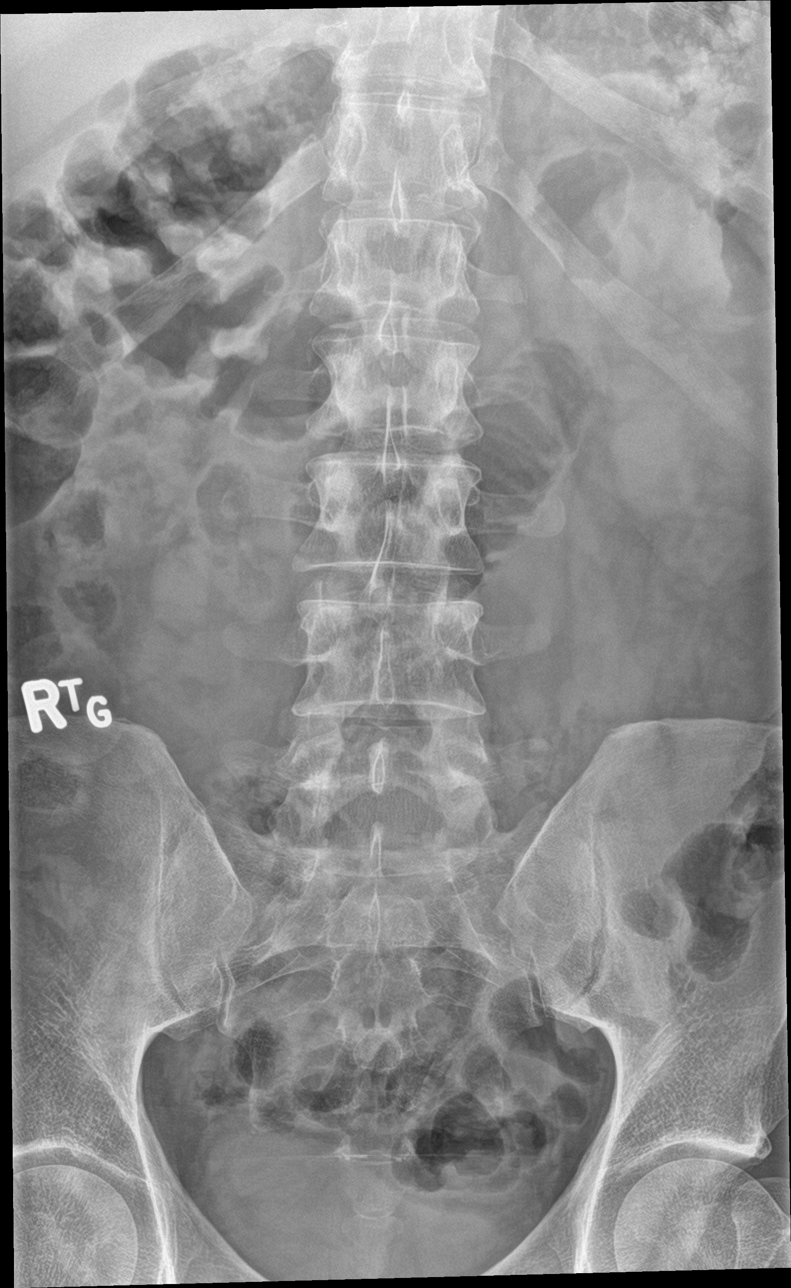

[l-spine obl (1 of 2)]
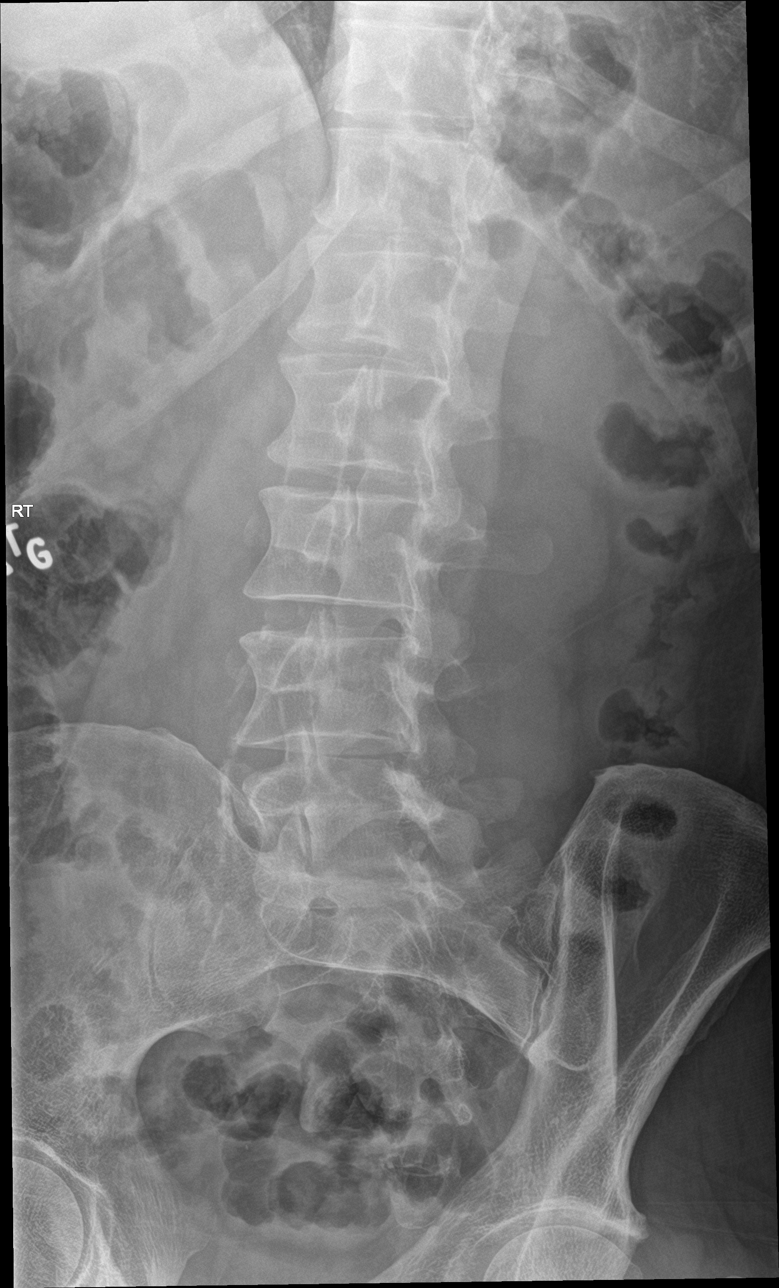

[l-spine obl (2 of 2)]
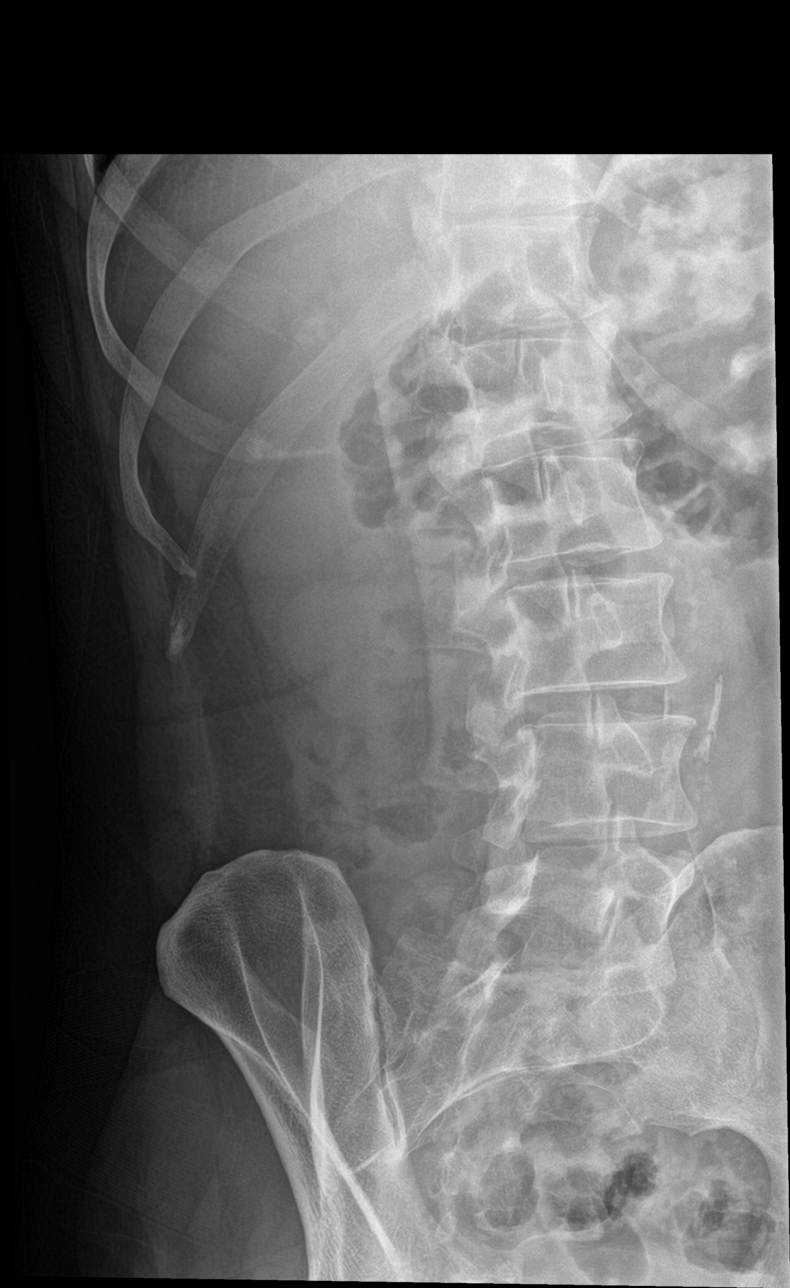

[l-spine lat]
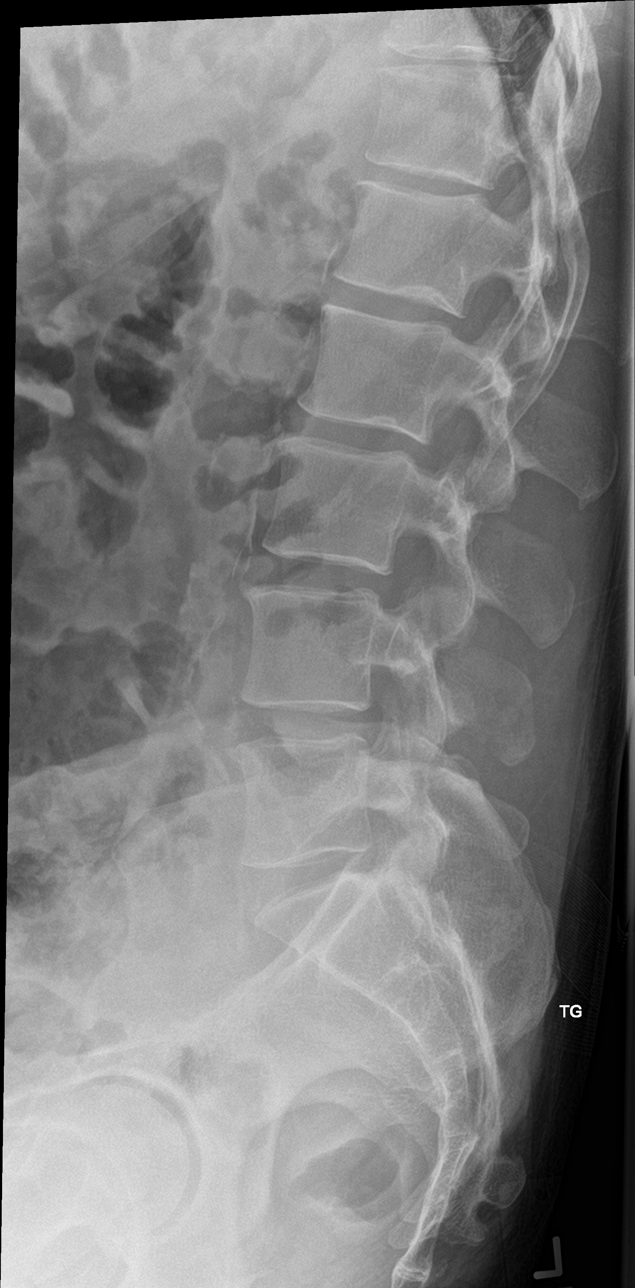

[l-spine spot]
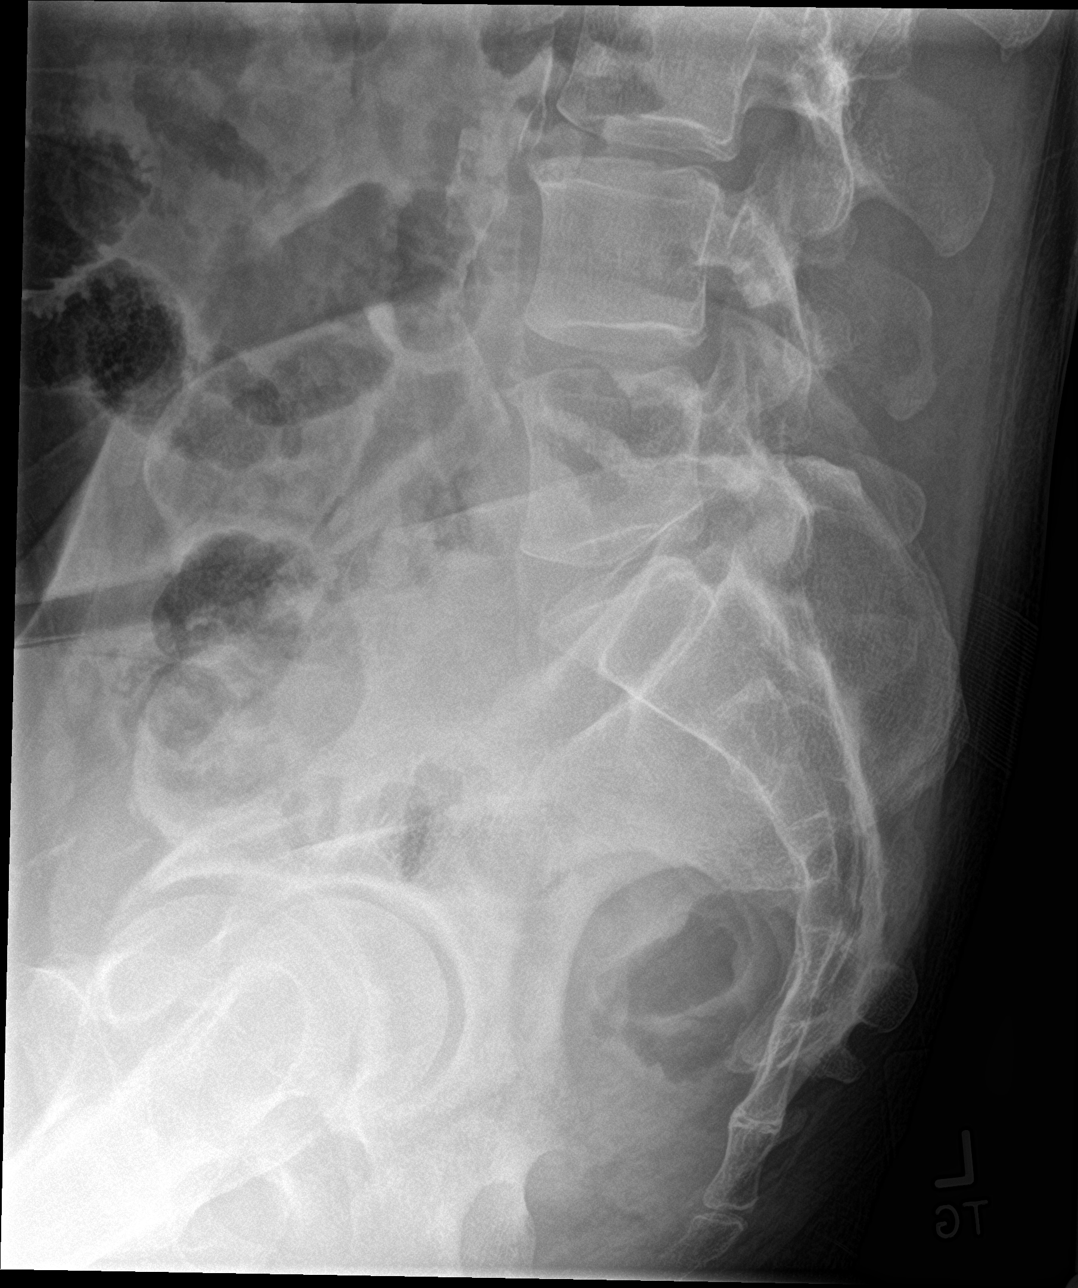

[5 of 5 positions shown; findings below may reference images not displayed]

FINDINGS: There is no evidence of lumbar spine fracture. Alignment is normal.
Intervertebral disc spaces are maintained.
IMPRESSION: Negative.

## 2019-10-04 NOTE — Telephone Encounter (Signed)
JCV lab drawn on 09/27/19, Index Value: 0.25, JCV antibody: Indeterminate.

## 2019-10-10 DIAGNOSIS — M9902 Segmental and somatic dysfunction of thoracic region: Secondary | ICD-10-CM | POA: Diagnosis not present

## 2019-10-10 DIAGNOSIS — M9901 Segmental and somatic dysfunction of cervical region: Secondary | ICD-10-CM | POA: Diagnosis not present

## 2019-10-10 DIAGNOSIS — M542 Cervicalgia: Secondary | ICD-10-CM | POA: Diagnosis not present

## 2019-10-10 DIAGNOSIS — M546 Pain in thoracic spine: Secondary | ICD-10-CM | POA: Diagnosis not present

## 2019-10-16 ENCOUNTER — Other Ambulatory Visit: Payer: Self-pay | Admitting: Family Medicine

## 2019-10-16 MED FILL — TAMSULOSIN HCL 0.4 MG CAP: 0.4 | 30 days supply | Qty: 30 | Fill #11

## 2019-10-16 MED FILL — OMEPRAZOLE 20 MG CAP: 20 | 90 days supply | Qty: 90 | Fill #0

## 2019-10-17 ENCOUNTER — Other Ambulatory Visit (HOSPITAL_BASED_OUTPATIENT_CLINIC_OR_DEPARTMENT_OTHER): Payer: Self-pay | Admitting: *Deleted

## 2019-10-17 DIAGNOSIS — E1169 Type 2 diabetes mellitus with other specified complication: Secondary | ICD-10-CM | POA: Diagnosis not present

## 2019-10-17 DIAGNOSIS — M542 Cervicalgia: Secondary | ICD-10-CM | POA: Diagnosis not present

## 2019-10-17 DIAGNOSIS — E119 Type 2 diabetes mellitus without complications: Secondary | ICD-10-CM | POA: Diagnosis not present

## 2019-10-17 DIAGNOSIS — E1129 Type 2 diabetes mellitus with other diabetic kidney complication: Secondary | ICD-10-CM | POA: Diagnosis not present

## 2019-10-17 DIAGNOSIS — E782 Mixed hyperlipidemia: Secondary | ICD-10-CM | POA: Diagnosis not present

## 2019-10-17 DIAGNOSIS — M546 Pain in thoracic spine: Secondary | ICD-10-CM | POA: Diagnosis not present

## 2019-10-17 DIAGNOSIS — Z23 Encounter for immunization: Secondary | ICD-10-CM | POA: Diagnosis not present

## 2019-10-17 DIAGNOSIS — M9902 Segmental and somatic dysfunction of thoracic region: Secondary | ICD-10-CM | POA: Diagnosis not present

## 2019-10-17 DIAGNOSIS — R809 Proteinuria, unspecified: Secondary | ICD-10-CM | POA: Diagnosis not present

## 2019-10-17 DIAGNOSIS — M9901 Segmental and somatic dysfunction of cervical region: Secondary | ICD-10-CM | POA: Diagnosis not present

## 2019-10-17 LAB — COMPREHENSIVE METABOLIC PANEL
Albumin: 4.8 (ref 3.5–5.0)
Calcium: 10 (ref 8.7–10.7)
GFR calc Af Amer: 125
GFR calc non Af Amer: 108
Globulin: 2.3

## 2019-10-17 LAB — BASIC METABOLIC PANEL
BUN: 16 (ref 4–21)
CO2: 24 — AB (ref 13–22)
Chloride: 101 (ref 99–108)
Creatinine: 0.8 (ref 0.6–1.3)
Glucose: 127
Potassium: 4.3 (ref 3.4–5.3)
Sodium: 140 (ref 137–147)

## 2019-10-17 LAB — LIPID PANEL
Cholesterol: 157 (ref 0–200)
HDL: 31 — AB (ref 35–70)
LDL Cholesterol: 103
LDl/HDL Ratio: 23
Triglycerides: 125 (ref 40–160)

## 2019-10-17 LAB — HEMOGLOBIN A1C: Hemoglobin A1C: 7

## 2019-10-17 LAB — HEPATIC FUNCTION PANEL
ALT: 18 (ref 10–40)
AST: 16 (ref 14–40)
Alkaline Phosphatase: 72 (ref 25–125)
Bilirubin, Total: 0.3

## 2019-10-17 LAB — MICROALBUMIN, URINE: Microalb, Ur: <5

## 2019-10-17 MED FILL — GLYXAMBI 25 MG-5 MG TABLET: 25-5 | 30 days supply | Qty: 30 | Fill #0

## 2019-10-24 DIAGNOSIS — M542 Cervicalgia: Secondary | ICD-10-CM | POA: Diagnosis not present

## 2019-10-24 DIAGNOSIS — M9902 Segmental and somatic dysfunction of thoracic region: Secondary | ICD-10-CM | POA: Diagnosis not present

## 2019-10-24 DIAGNOSIS — M546 Pain in thoracic spine: Secondary | ICD-10-CM | POA: Diagnosis not present

## 2019-10-24 DIAGNOSIS — M9901 Segmental and somatic dysfunction of cervical region: Secondary | ICD-10-CM | POA: Diagnosis not present

## 2019-10-25 ENCOUNTER — Encounter: Payer: Self-pay | Admitting: Neurology

## 2019-10-25 ENCOUNTER — Ambulatory Visit (INDEPENDENT_AMBULATORY_CARE_PROVIDER_SITE_OTHER): Payer: BC Managed Care – PPO | Admitting: Neurology

## 2019-10-25 VITALS — BP 114/67 | HR 96 | Ht 71.0 in | Wt 166.0 lb

## 2019-10-25 DIAGNOSIS — Z79899 Other long term (current) drug therapy: Secondary | ICD-10-CM

## 2019-10-25 DIAGNOSIS — G35 Multiple sclerosis: Secondary | ICD-10-CM

## 2019-10-25 DIAGNOSIS — R3911 Hesitancy of micturition: Secondary | ICD-10-CM | POA: Diagnosis not present

## 2019-10-25 DIAGNOSIS — F09 Unspecified mental disorder due to known physiological condition: Secondary | ICD-10-CM

## 2019-10-25 DIAGNOSIS — R269 Unspecified abnormalities of gait and mobility: Secondary | ICD-10-CM | POA: Diagnosis not present

## 2019-10-25 NOTE — Telephone Encounter (Signed)
Called [facility] and spoke with Helene Kelp. She read report to me from JCV Ab: index: 0.25, indeterminate, inhibition assay: negative. She will fax report to [place] at [phone].

## 2019-10-25 NOTE — Progress Notes (Signed)
[facility]  PATIENT: [name] DOB: March 20, 1970  REFERRING DOCTOR OR PCP:  Asencion Partridge Dohmeier SOURCE: patient, notes from Dr. Brett Fairy, imaging/lab reports and MRI images were personally reviewed  _________________________________   HISTORICAL  CHIEF COMPLAINT:  Chief Complaint  Patient presents with   Follow-up    RM 12 with wife. Last seen 04/25/2019.    Multiple Sclerosis    On Tysabri. Last: 09/27/19. Next: 11/08/19. Last JCV: 09/27/19 0.25, indeterminate. Tolerating infusions ok. Wanting to discuss oral options.     HISTORY OF PRESENT ILLNESS:  [name] is a 49 y.o. man with relapsing remitting multiple sclerosis diagnosed in 1999.    Update 10/25/2019: He is on Tysabri and tolerates it well.  He is mostly stable.   His next infusion will be in a month He is JCV Ab negative but has had some low positive readings.     And has been getting Tysabri He denies any exacerbation but has noted a stuffed up sensation in his right ear like water in his ear.    He also notes more difficulty with the right side.   The right leg tightens up at times. He notes some clumsiness in the right foot while driving.   This is not a problem for short trips and he rarely drives longer distances.     He goes upstairs well but has some difficulty with downstairs.    He uses the b[place]ister.   He also notes difficulty going down slopes.Marland Kitchen   He can walk over a mile without stopping.      Vision is doing about the same.   He feels there is bladder and bowel numbness - he has urgency of both but has no incontinence.  He has a large prostate and is on tamsulosin and tadalafil.  He has hesitancy but thinks he empties.  He has seen urology.     He is having some cognitive issues, with word finding errors   He feels he struggles with conversation.  He feels focus is worse.   He notes fatigue as well.     He had his first Covid 19 vaccinations in the spring.  He is working from  home now.       MS History: In 1999, he recalls having a GI virus and waking up the next day with numbness in his right arm.  He was referred to Dr. Marijean Bravo in [facility].   He had an LP showing elevated OCB and he was officially diagnosed with RRMS.    He started seeing Dr. Hart Robinsons at [facility] for a few years.  He was placed on Avonex and then Copaxone.   He tolerated the medications ok but did not note any improvements afterwards. He transferred care to Dr. Trula Ore who questioned the diagnosis of MS.   He stopped taking any DMT.   This July, on a hot day, he noticed tingling in his fingertips.  The next morning he woke up with right sided weakness and clumsiness in the right leg and numbness in the leg and arm.   He has not had much improvement since the onset of the new symptoms.  He saw Dr. Brett Fairy and had a repeat LP.  It showed OCB in the CSF and serum.   No change in walking afterwards.  He started Tysabri in 2019.  Initially he was JCV antibody negative.  A titer in 2021 was low positive but repeat study September 2021 was negative again.  Imaging:  MRI images of the brain and cervical spine from 09/18/2017.   The MRI of the brain shows multiple T2/FLAIR hyperintense foci, mostly in the periventricular white matter radially oriented to the ventricles.  There are no lesions in the brainstem or cerebellum.  One small focus in the left parietal lobe enhanced after contrast.  There is generalized atrophy but the ventricles appear to be enlarged, mildly out of proportion to the extent of atrophy.  The MRI of the cervical spine showed 5-6 T2 hyperintense foci, most of which were posterolateral and none enhancing.  MRI of the brain and cervical spine 03/21/2019 did not show any new lesions compared to 2019.  The enhancing lesion noted in 2019 no longer enhances.  CSF 10/27/2017 showed > 5 oligoclonal bands in CSF and additional bands in CSF and serum.   Protein was markedly elevated at 119.   Glucose slightly  elevated (diabetic).  WBC elevated at 31 with 92% lymphs.   REVIEW OF SYSTEMS: Constitutional: No fevers, chills, sweats, or change in appetite.  He has fatigue.  There is some insomnia. Eyes: No visual changes, double vision, eye pain Ear, nose and throat: No hearing loss, ear pain, nasal congestion, sore throat Cardiovascular: No chest pain, palpitations Respiratory: No shortness of breath at rest or with exertion.   No wheezes GastrointestinaI: No nausea, vomiting, diarrhea, abdominal pain, fecal incontinence Genitourinary: No dysuria, urinary retention or frequency.  No nocturia. Musculoskeletal: No neck pain, back pain Integumentary: No rash, pruritus, skin lesions Neurological: as above Psychiatric: No depression at this time though he had some a few months ago.  No anxiety Endocrine: No palpitations, diaphoresis, change in appetite, change in weigh or increased thirst Hematologic/Lymphatic: No anemia, purpura, petechiae. Allergic/Immunologic: No itchy/runny eyes, nasal congestion, recent allergic reactions, rashes  ALLERGIES: No Known Allergies  HOME MEDICATIONS:  Current Outpatient Medications:    aspirin EC 81 MG tablet, Take 81 mg by mouth daily., Disp: , Rfl:    GLYXAMBI 25-5 MG TABS, Take 1 tablet by mouth every morning., Disp: , Rfl:    lisinopril (PRINIVIL,ZESTRIL) 5 MG tablet, , Disp: , Rfl: 2   natalizumab (TYSABRI) 300 MG/15ML injection, Inject 300 mg into the vein every 30 (thirty) days. Start form provided to [facility] on 12/06/17., Disp: , Rfl:    omeprazole (PRILOSEC) 20 MG capsule, TAKE 1 CAPSULE BY MOUTH ONCE DAILY, Disp: 90 capsule, Rfl: 1   simvastatin (ZOCOR) 40 MG tablet, TAKE 1 TABLET BY MOUTH DAILY AT 6:00 PM **NEED LABS DRAWN** (Patient taking differently: Take 40 mg by mouth daily at 6 PM. ), Disp: 15 tablet, Rfl: 0   tadalafil (CIALIS) 5 MG tablet, Take 5 mg by mouth daily as needed for erectile dysfunction., Disp: , Rfl:    tamsulosin  (FLOMAX) 0.4 MG CAPS capsule, Take 0.4 mg by mouth., Disp: , Rfl:   PAST MEDICAL HISTORY: Past Medical History:  Diagnosis Date   Diabetes mellitus without complication ([place])    Hypertension    Multiple sclerosis (HCC) 1999   Dr Trula Ore   Nephrolithiasis    sees urology    PAST SURGICAL HISTORY: Past Surgical History:  Procedure Laterality Date   APPENDECTOMY  2003   LITHOTRIPSY      FAMILY HISTORY: Family History  Problem Relation Age of Onset   Hypertension Mother    Alcohol abuse Father    Cancer Maternal Grandfather    Diabetes Maternal Aunt     SOCIAL HISTORY:  Social History   Socioeconomic History  Marital status: Married    Spouse name: christy   Number of children: 2   Years of education: Not on file   Highest education level: Not on file  Occupational History   Occupation: IT    Comment: works in Scientist, research (life sciences) estate  Tobacco Use   Smoking status: Former Smoker    Packs/day: 1.00    Types: Cigarettes    Quit date: 10/28/2011    Years since quitting: 7.9   Smokeless tobacco: Never Used  Vaping Use   Vaping Use: Never used  Substance and Sexual Activity   Alcohol use: Yes    Comment: sometimes    Drug use: Yes    Types: Marijuana    Comment: Weekly   Sexual activity: Yes  Other Topics Concern   Not on file  Social History Narrative   Right handed    Caffeine use: Diet soda   Social Determinants of Health   Financial Resource Strain:    Difficulty of Paying Living Expenses: Not on file  Food Insecurity:    Worried About [place] in the Last Year: Not on file   [employer] of Food in the Last Year: Not on file  Transportation Needs:    Lack of Transportation (Medical): Not on file   Lack of Transportation (Non-Medical): Not on file  Physical Activity:    Days of Exercise per Week: Not on file   Minutes of Exercise per Session: Not on file  Stress:    Feeling of Stress : Not on file  Social Connections:      Frequency of Communication with Friends and Family: Not on file   Frequency of Social Gatherings with Friends and Family: Not on file   Attends Religious Services: Not on file   Active Member of Clubs or Organizations: Not on file   Attends Archivist Meetings: Not on file   Marital Status: Not on file  Intimate Partner Violence:    Fear of Current or Ex-Partner: Not on file   Emotionally Abused: Not on file   Physically Abused: Not on file   Sexually Abused: Not on file     PHYSICAL EXAM  Vitals:   10/25/19 1509  BP: 114/67  Pulse: 96  Weight: 166 lb (75.3 kg)  Height: 5\' 11"  (1.803 m)    Body mass index is 23.15 kg/m.   General: The patient is well-developed and well-nourished and in no acute distress   Skin: Extremities are without rash or edema.   Neurologic Exam  Mental status: The patient is alert and oriented x 3 at the time of the examination. The patient has apparent normal recent and remote memory, with an apparently normal attention span and concentration ability.   Speech shows some pauses, with right word  Cranial nerves: Extraocular movements are full.  Color vision is symmetric.  Facial strength and sensation was normal.  Trapezius strength was normal. No obvious hearing deficits are noted.  Motor:  Muscle bulk is normal.  Muscle tone is increased in the right leg.  Strength was 5/5 except 4+/5 in the right EHL.  Sensory: Sensory testing shows reduced sensation to vibration and temperature in the right leg.   Normal in arm  Coordination: Cerebellar testing reveals good finger-nose-finger and slightly reduced rightt heel-to-shin .  Gait and station: Station is normal.  The gait is mildly wide and spastic.  Tandem gait is moderately wide..  Romberg is negative.  Reflexes: Deep tendon reflexes are increased, legs  more than arms.  There are cross abductor reflexes at the knees and nonsustained clonus at the ankles.    DIAGNOSTIC DATA  (LABS, IMAGING, TESTING) - I reviewed patient records, labs, notes, testing and imaging myself where available.  Lab Results  Component Value Date   WBC 8.2 09/27/2019   HGB 14.1 09/27/2019   HCT 40.5 09/27/2019   MCV 85 09/27/2019   PLT 226 09/27/2019      Component Value Date/Time   NA 139 10/24/2018 1403   K 4.1 10/24/2018 1403   CL 102 10/24/2018 1403   CO2 26 10/24/2018 1403   GLUCOSE 90 10/24/2018 1403   BUN 13 10/24/2018 1403   CREATININE 0.75 10/24/2018 1403   CALCIUM 10.2 10/24/2018 1403   PROT 7.4 10/24/2018 1403   PROT 7.5 11/18/2017 1023   ALBUMIN 5.2 11/18/2017 1023   AST 14 10/24/2018 1403   ALT 16 10/24/2018 1403   ALKPHOS 65 11/18/2017 1023   BILITOT 0.6 10/24/2018 1403   BILITOT 0.4 11/18/2017 1023   GFRNONAA 109 10/24/2018 1403   GFRAA 127 10/24/2018 1403   Lab Results  Component Value Date   CHOL 178 10/24/2018   HDL 29 (L) 10/24/2018   LDLCALC 109 (H) 10/24/2018   LDLDIRECT 111 (H) 02/15/2014   TRIG 282 (H) 10/24/2018   CHOLHDL 6.1 (H) 10/24/2018   Lab Results  Component Value Date   HGBA1C 7.0 (H) 10/24/2018      ASSESSMENT AND PLAN  1. Multiple sclerosis ([place])   2. High risk medication use   3. Cognitive deficit secondary to multiple sclerosis ([place])   4. Gait disturbance   5. Urinary hesitancy     1.   Continue Tysabri for relapsing remitting MS.   due to the one titer being positive we will continue every 6 weeks.   2.  Continue tamsulosin for bladder hesitancy.  Consider adding low dose oxybutynin 3.   Stay active and exercise as tolerated. 4.   He will return to see me in 6 months or sooner if there are new or worsening neurologic symptoms.  45-minute office visit with the majority of the time spent face-to-face for history and physical, discussion/counseling and decision-making.  Additional time with record review and documentation.   Dashun Borre A. Felecia Shelling, MD, [facility] 48/05/4625, 0:35 PM Certified in Neurology, Clinical  Neurophysiology, Sleep Medicine, Pain Medicine and Neuroimaging  [facility] [address], [place], [place] [address] [phone]

## 2019-10-31 DIAGNOSIS — M546 Pain in thoracic spine: Secondary | ICD-10-CM | POA: Diagnosis not present

## 2019-10-31 DIAGNOSIS — M9901 Segmental and somatic dysfunction of cervical region: Secondary | ICD-10-CM | POA: Diagnosis not present

## 2019-10-31 DIAGNOSIS — M9902 Segmental and somatic dysfunction of thoracic region: Secondary | ICD-10-CM | POA: Diagnosis not present

## 2019-10-31 DIAGNOSIS — M542 Cervicalgia: Secondary | ICD-10-CM | POA: Diagnosis not present

## 2019-11-14 MED FILL — GLYXAMBI 25 MG-5 MG TABLET: 25-5 | 30 days supply | Qty: 30 | Fill #1

## 2019-11-20 DIAGNOSIS — G35 Multiple sclerosis: Secondary | ICD-10-CM | POA: Diagnosis not present

## 2019-12-11 ENCOUNTER — Other Ambulatory Visit (HOSPITAL_BASED_OUTPATIENT_CLINIC_OR_DEPARTMENT_OTHER): Payer: Self-pay | Admitting: *Deleted

## 2019-12-11 MED FILL — LISINOPRIL 5 MG TABLET: 5 | 90 days supply | Qty: 90 | Fill #0

## 2019-12-11 MED FILL — SIMVASTATIN 40 MG TABS: 40 | 90 days supply | Qty: 90 | Fill #1

## 2019-12-11 MED FILL — GLYXAMBI 25 MG-5 MG TABLET: 25-5 | 30 days supply | Qty: 30 | Fill #2

## 2019-12-19 ENCOUNTER — Encounter: Payer: Self-pay | Admitting: Family Medicine

## 2019-12-19 ENCOUNTER — Other Ambulatory Visit: Payer: Self-pay

## 2019-12-19 ENCOUNTER — Ambulatory Visit (INDEPENDENT_AMBULATORY_CARE_PROVIDER_SITE_OTHER): Payer: BC Managed Care – PPO | Admitting: Family Medicine

## 2019-12-19 ENCOUNTER — Other Ambulatory Visit: Payer: Self-pay | Admitting: Family Medicine

## 2019-12-19 VITALS — BP 127/74 | HR 95 | Ht 71.0 in | Wt 166.0 lb

## 2019-12-19 DIAGNOSIS — N400 Enlarged prostate without lower urinary tract symptoms: Secondary | ICD-10-CM

## 2019-12-19 DIAGNOSIS — Z0000 Encounter for general adult medical examination without abnormal findings: Secondary | ICD-10-CM | POA: Diagnosis not present

## 2019-12-19 DIAGNOSIS — N401 Enlarged prostate with lower urinary tract symptoms: Secondary | ICD-10-CM | POA: Diagnosis not present

## 2019-12-19 DIAGNOSIS — Z1211 Encounter for screening for malignant neoplasm of colon: Secondary | ICD-10-CM

## 2019-12-19 DIAGNOSIS — E1121 Type 2 diabetes mellitus with diabetic nephropathy: Secondary | ICD-10-CM | POA: Diagnosis not present

## 2019-12-19 DIAGNOSIS — E782 Mixed hyperlipidemia: Secondary | ICD-10-CM

## 2019-12-19 DIAGNOSIS — N138 Other obstructive and reflux uropathy: Secondary | ICD-10-CM

## 2019-12-19 MED ORDER — TAMSULOSIN HCL 0.4 MG PO CAPS: 0.4000 mg | ORAL_CAPSULE | Freq: Every day | ORAL | 1 refills | Status: DC

## 2019-12-19 MED ORDER — SIMVASTATIN 40 MG PO TABS: 40.0000 mg | ORAL_TABLET | Freq: Every day | ORAL | 3 refills | Status: DC

## 2019-12-19 MED FILL — TAMSULOSIN HCL 0.4 MG CAP: 0.4 | 90 days supply | Qty: 90 | Fill #0

## 2019-12-19 NOTE — Assessment & Plan Note (Signed)
Tolerating statin well.  LDL not quite under 100 it was 103 discussed working on dietary changes and staying active.  If next LDL not under 100 we will need to adjust his statin or change statins.

## 2019-12-19 NOTE — Assessment & Plan Note (Signed)
Follows with Dr. Veva Holes.  Last A1c 7.0.  Discussed continue to work on lower carb intake.

## 2019-12-19 NOTE — Assessment & Plan Note (Signed)
Happy to take over the tamsulosin.  He can always return to urology if he starts to pass more kidney stones.

## 2019-12-19 NOTE — Progress Notes (Addendum)
Established Patient Office Visit  Subjective:  Patient ID: [name], male    DOB: Mar 15, 1970  Age: 49 y.o. MRN: [id number]  CC:  Chief Complaint  Patient presents with  . Annual Exam    HPI [name] presents for CPE.  He is doing well overall he follows with Dr. Veva Holes, endocrinology for his diabetes.  Most recent A1c was 7.0.  He admits he does eat a fair amount of carbs but plans to work on that.  He also reports that his MS is gradually gotten a little bit worse has been affecting his right foot in particular.  He reports that he did get his eye exam done in November at my eye doctor on [address] in [place].  We will call to get a copy of that report.  No regular exercise he does walk his dog daily.  He also wants to know what I be willing to take over his tamsulosin he was unable to get in with urology until May.  He sees them for a prior history of kidney stones and BPH.  Did get his flu vaccine in September.  Past Medical History:  Diagnosis Date  . Diabetes mellitus without complication ([place])   . Hypertension   . Multiple sclerosis (HCC) 1999   Dr Trula Ore  . Nephrolithiasis    sees urology    Past Surgical History:  Procedure Laterality Date  . APPENDECTOMY  2003  . LITHOTRIPSY      Family History  Problem Relation Age of Onset  . Hypertension Mother   . Alcohol abuse Father   . Cancer Maternal Grandfather   . Diabetes Maternal Aunt     Social History   Socioeconomic History  . Marital status: Married    Spouse name: christy  . Number of children: 2  . Years of education: Not on file  . Highest education level: Not on file  Occupational History  . Occupation: IT    Comment: works in Personal assistant  Tobacco Use  . Smoking status: Former Smoker    Packs/day: 1.00    Types: Cigarettes    Quit date: 10/28/2011    Years since quitting: 8.1  . Smokeless tobacco: Never Used  Vaping Use  . Vaping Use: Never used  Substance  and Sexual Activity  . Alcohol use: Yes    Comment: sometimes   . Drug use: Yes    Types: Marijuana    Comment: Weekly  . Sexual activity: Yes  Other Topics Concern  . Not on file  Social History Narrative   Right handed    Caffeine use: Diet soda   Social Determinants of Health   Financial Resource Strain:   . Difficulty of Paying Living Expenses: Not on file  Food Insecurity:   . Worried About Charity fundraiser in the Last Year: Not on file  . Ran Out of Food in the Last Year: Not on file  Transportation Needs:   . Lack of Transportation (Medical): Not on file  . Lack of Transportation (Non-Medical): Not on file  Physical Activity:   . Days of Exercise per Week: Not on file  . Minutes of Exercise per Session: Not on file  Stress:   . Feeling of Stress : Not on file  Social Connections:   . Frequency of Communication with Friends and Family: Not on file  . Frequency of Social Gatherings with Friends and Family: Not on file  . Attends Religious Services: Not  on file  . Active Member of Clubs or Organizations: Not on file  . Attends Archivist Meetings: Not on file  . Marital Status: Not on file  Intimate Partner Violence:   . Fear of Current or Ex-Partner: Not on file  . Emotionally Abused: Not on file  . Physically Abused: Not on file  . Sexually Abused: Not on file    Outpatient Medications Prior to Visit  Medication Sig Dispense Refill  . glucose blood (CONTOUR NEXT TEST) test strip one strip (1 each dose) by Other route daily.    Marland Kitchen aspirin EC 81 MG tablet Take 81 mg by mouth daily.    Marland Kitchen GLYXAMBI 25-5 MG TABS Take 1 tablet by mouth every morning.    Marland Kitchen lisinopril (PRINIVIL,ZESTRIL) 5 MG tablet   2  . natalizumab (TYSABRI) 300 MG/15ML injection Inject 300 mg into the vein every 6 (six) weeks. Start form provided to [facility] on 12/06/17.     Marland Kitchen omeprazole (PRILOSEC) 20 MG capsule TAKE 1 CAPSULE BY MOUTH ONCE DAILY 90 capsule 1  . tadalafil (CIALIS) 5 MG  tablet Take 5 mg by mouth daily as needed for erectile dysfunction.    . simvastatin (ZOCOR) 40 MG tablet TAKE 1 TABLET BY MOUTH DAILY AT 6:00 PM **NEED LABS DRAWN** (Patient taking differently: Take 40 mg by mouth daily at 6 PM. ) 15 tablet 0  . tamsulosin (FLOMAX) 0.4 MG CAPS capsule Take 0.4 mg by mouth.     No facility-administered medications prior to visit.    No Known Allergies  ROS Review of Systems    Objective:    Physical Exam Constitutional:      Appearance: He is well-developed.  HENT:     Head: Normocephalic and atraumatic.     Right Ear: External ear normal.     Left Ear: External ear normal.     Nose: Nose normal.  Eyes:     Conjunctiva/sclera: Conjunctivae normal.     Pupils: Pupils are equal, round, and reactive to light.  Neck:     Thyroid: No thyromegaly.  Cardiovascular:     Rate and Rhythm: Normal rate and regular rhythm.     Heart sounds: Normal heart sounds.  Pulmonary:     Effort: Pulmonary effort is normal.     Breath sounds: Normal breath sounds.  Abdominal:     General: Bowel sounds are normal. There is no distension.     Palpations: Abdomen is soft. There is no mass.     Tenderness: There is no abdominal tenderness. There is no guarding or rebound.  Musculoskeletal:        General: Normal range of motion.     Cervical back: Normal range of motion and neck supple.  Lymphadenopathy:     Cervical: No cervical adenopathy.  Skin:    General: Skin is warm and dry.  Neurological:     Mental Status: He is alert and oriented to person, place, and time.     Deep Tendon Reflexes: Reflexes are normal and symmetric.  Psychiatric:        Behavior: Behavior normal.        Thought Content: Thought content normal.        Judgment: Judgment normal.     BP 127/74   Pulse 95   Ht 5\' 11"  (1.803 m)   Wt 166 lb (75.3 kg)   SpO2 98%   BMI 23.15 kg/m  Wt Readings from Last 3 Encounters:  12/19/19 166 lb (  75.3 kg)  10/25/19 166 lb (75.3 kg)   04/25/19 172 lb 8 oz (78.2 kg)     Health Maintenance Due  Topic Date Due  . FOOT EXAM  10/12/2017  . OPHTHALMOLOGY EXAM  10/24/2019    There are no preventive care reminders to display for this patient.  No results found for: TSH Lab Results  Component Value Date   WBC 8.2 09/27/2019   HGB 14.1 09/27/2019   HCT 40.5 09/27/2019   MCV 85 09/27/2019   PLT 226 09/27/2019   Lab Results  Component Value Date   NA 139 10/24/2018   K 4.1 10/24/2018   CO2 26 10/24/2018   GLUCOSE 90 10/24/2018   BUN 13 10/24/2018   CREATININE 0.75 10/24/2018   BILITOT 0.6 10/24/2018   ALKPHOS 65 11/18/2017   AST 14 10/24/2018   ALT 16 10/24/2018   PROT 7.4 10/24/2018   ALBUMIN 5.2 11/18/2017   CALCIUM 10.2 10/24/2018   Lab Results  Component Value Date   CHOL 178 10/24/2018   Lab Results  Component Value Date   HDL 29 (L) 10/24/2018   Lab Results  Component Value Date   LDLCALC 109 (H) 10/24/2018   Lab Results  Component Value Date   TRIG 282 (H) 10/24/2018   Lab Results  Component Value Date   CHOLHDL 6.1 (H) 10/24/2018   Lab Results  Component Value Date   HGBA1C 7.0 10/17/2019      Assessment & Plan:   Problem List Items Addressed This Visit      Endocrine   Diabetes mellitus, type 2 ([place])    Follows with Dr. Veva Holes.  Last A1c 7.0.  Discussed continue to work on lower carb intake.      Relevant Medications   simvastatin (ZOCOR) 40 MG tablet     Genitourinary   BPH (benign prostatic hyperplasia)    Happy to take over the tamsulosin.  He can always return to urology if he starts to pass more kidney stones.      Relevant Medications   tamsulosin (FLOMAX) 0.4 MG CAPS capsule   Benign prostatic hyperplasia with urinary obstruction   Relevant Medications   tamsulosin (FLOMAX) 0.4 MG CAPS capsule     Other   Hyperlipidemia    Tolerating statin well.  LDL not quite under 100 it was 103 discussed working on dietary changes and staying active.  If next LDL  not under 100 we will need to adjust his statin or change statins.      Relevant Medications   simvastatin (ZOCOR) 40 MG tablet    Other Visit Diagnoses    Routine general medical examination at a health care facility    -  Primary   Screening for malignant neoplasm of colon       Relevant Orders   Ambulatory referral to Gastroenterology     Keep up a regular exercise program and make sure you are eating a healthy diet Try to eat 4 servings of dairy a day, or if you are lactose intolerant take a calcium with vitamin D daily.  Your vaccines are up to date.    Meds ordered this encounter  Medications  . simvastatin (ZOCOR) 40 MG tablet    Sig: Take 1 tablet (40 mg total) by mouth daily at 6 PM.    Dispense:  90 tablet    Refill:  3  . tamsulosin (FLOMAX) 0.4 MG CAPS capsule    Sig: Take 1 capsule (0.4 mg total) by  mouth daily.    Dispense:  90 capsule    Refill:  1    Follow-up: Return in about 1 year (around 12/18/2020) for Wellness Exam.    Beatrice Lecher, MD

## 2020-01-19 MED FILL — GLYXAMBI 25 MG-5 MG TABLET: 25-5 | 30 days supply | Qty: 30 | Fill #3

## 2020-01-19 MED FILL — OMEPRAZOLE 20 MG CAP: 20 | 90 days supply | Qty: 90 | Fill #1

## 2020-01-22 ENCOUNTER — Telehealth: Payer: Self-pay | Admitting: *Deleted

## 2020-01-22 NOTE — Telephone Encounter (Signed)
Faxed completed/signed Tysabri pt status report and reauth questionnaire to [facility] touch at [phone]. Received confirmation.   Received fax notification back from touch program that pt re-auth for Tysabri from 01/22/2020-08/24/2020 Pt enrollment number: [id number]. Account: [facility]. Site auth number: [id number]

## 2020-02-01 DIAGNOSIS — G35 Multiple sclerosis: Secondary | ICD-10-CM | POA: Diagnosis not present

## 2020-02-08 ENCOUNTER — Other Ambulatory Visit (HOSPITAL_BASED_OUTPATIENT_CLINIC_OR_DEPARTMENT_OTHER): Payer: Self-pay | Admitting: Urology

## 2020-02-08 DIAGNOSIS — N138 Other obstructive and reflux uropathy: Secondary | ICD-10-CM | POA: Diagnosis not present

## 2020-02-08 DIAGNOSIS — R338 Other retention of urine: Secondary | ICD-10-CM | POA: Diagnosis not present

## 2020-02-08 DIAGNOSIS — N528 Other male erectile dysfunction: Secondary | ICD-10-CM | POA: Diagnosis not present

## 2020-02-08 DIAGNOSIS — N200 Calculus of kidney: Secondary | ICD-10-CM | POA: Diagnosis not present

## 2020-02-08 DIAGNOSIS — N401 Enlarged prostate with lower urinary tract symptoms: Secondary | ICD-10-CM | POA: Diagnosis not present

## 2020-02-08 MED FILL — ALFUZOSIN HCL ER 10 MG TAB: 10 | 30 days supply | Qty: 30 | Fill #0

## 2020-02-13 ENCOUNTER — Other Ambulatory Visit: Payer: Self-pay | Admitting: *Deleted

## 2020-02-13 DIAGNOSIS — Z1211 Encounter for screening for malignant neoplasm of colon: Secondary | ICD-10-CM

## 2020-02-21 MED FILL — GLYXAMBI 25 MG-5 MG TABLET: 25-5 | 30 days supply | Qty: 30 | Fill #4

## 2020-03-11 MED FILL — SIMVASTATIN 40 MG TABS: 40 | 90 days supply | Qty: 90 | Fill #0

## 2020-03-13 DIAGNOSIS — R3914 Feeling of incomplete bladder emptying: Secondary | ICD-10-CM | POA: Diagnosis not present

## 2020-03-13 DIAGNOSIS — N138 Other obstructive and reflux uropathy: Secondary | ICD-10-CM | POA: Diagnosis not present

## 2020-03-13 DIAGNOSIS — N401 Enlarged prostate with lower urinary tract symptoms: Secondary | ICD-10-CM | POA: Diagnosis not present

## 2020-03-13 DIAGNOSIS — R339 Retention of urine, unspecified: Secondary | ICD-10-CM | POA: Diagnosis not present

## 2020-03-13 DIAGNOSIS — N529 Male erectile dysfunction, unspecified: Secondary | ICD-10-CM | POA: Diagnosis not present

## 2020-03-14 ENCOUNTER — Other Ambulatory Visit: Payer: Self-pay | Admitting: *Deleted

## 2020-03-14 ENCOUNTER — Telehealth: Payer: Self-pay | Admitting: *Deleted

## 2020-03-14 DIAGNOSIS — Z79899 Other long term (current) drug therapy: Secondary | ICD-10-CM | POA: Diagnosis not present

## 2020-03-14 DIAGNOSIS — G35 Multiple sclerosis: Secondary | ICD-10-CM

## 2020-03-14 NOTE — Addendum Note (Signed)
Addended by: Inis Sizer D on: 03/14/2020 10:30 AM   Modules accepted: Orders

## 2020-03-14 NOTE — Telephone Encounter (Signed)
Placed [facility] lab in [facility] lock box for routine lab pick up. Results pending. 

## 2020-03-15 LAB — CBC WITH DIFFERENTIAL/PLATELET
Basophils Absolute: 0.1 10*3/uL (ref 0.0–0.2)
Basos: 1 %
EOS (ABSOLUTE): 0.3 10*3/uL (ref 0.0–0.4)
Eos: 5 %
Hematocrit: 42.1 % (ref 37.5–51.0)
Hemoglobin: 14.5 g/dL (ref 13.0–17.7)
Immature Grans (Abs): 0 10*3/uL (ref 0.0–0.1)
Immature Granulocytes: 1 %
Lymphocytes Absolute: 2.9 10*3/uL (ref 0.7–3.1)
Lymphs: 41 %
MCH: 29.8 pg (ref 26.6–33.0)
MCHC: 34.4 g/dL (ref 31.5–35.7)
MCV: 86 fL (ref 79–97)
Monocytes Absolute: 0.6 10*3/uL (ref 0.1–0.9)
Monocytes: 10 %
Neutrophils Absolute: 2.8 10*3/uL (ref 1.4–7.0)
Neutrophils: 42 %
Platelets: 232 10*3/uL (ref 150–450)
RBC: 4.87 x10E6/uL (ref 4.14–5.80)
RDW: 13.5 % (ref 11.6–15.4)
WBC: 6.7 10*3/uL (ref 3.4–10.8)

## 2020-03-18 MED FILL — ALFUZOSIN HCL ER 10 MG TAB: 10 | 30 days supply | Qty: 30 | Fill #1

## 2020-03-18 MED FILL — GLYXAMBI 25 MG-5 MG TABLET: 25-5 | 30 days supply | Qty: 30 | Fill #5

## 2020-03-21 NOTE — Telephone Encounter (Signed)
JCV ab drawn on 03/14/20 positive, index: 0.44. Gave to MD to review.

## 2020-03-26 ENCOUNTER — Other Ambulatory Visit: Payer: Self-pay

## 2020-03-26 ENCOUNTER — Other Ambulatory Visit: Payer: Self-pay | Admitting: Gastroenterology

## 2020-03-26 ENCOUNTER — Ambulatory Visit (AMBULATORY_SURGERY_CENTER): Payer: BC Managed Care – PPO

## 2020-03-26 VITALS — Ht 71.0 in | Wt 176.4 lb

## 2020-03-26 DIAGNOSIS — Z1211 Encounter for screening for malignant neoplasm of colon: Secondary | ICD-10-CM

## 2020-03-26 MED ORDER — PLENVU 140 G PO SOLR: 1.0000 | ORAL | 0 refills | Status: DC

## 2020-03-26 MED FILL — PLENVU 140 GM SOLR: 140 | 3 days supply | Qty: 3 | Fill #0

## 2020-03-26 NOTE — Progress Notes (Signed)
No egg or soy allergy known to patient  No issues with past sedation with any surgeries or procedures No intubation problems in the past  No FH of Malignant Hyperthermia No diet pills per patient No home 02 use per patient  No blood thinners per patient  Pt denies issues with constipation  No A fib or A flutter  EMMI video to pt or via [place] 19 guidelines implemented in PV today with Pt and RN  Pt is fully vaccinated  for Covid   Plenvu Coupon given to pt in PV today , Code to Pharmacy and  NO PA's for preps discussed with pt In PV today  Discussed with pt there will be an out-of-pocket cost for prep and that varies from $0 to 70 dollars   Due to the COVID-19 pandemic we are asking patients to follow certain guidelines.  Pt aware of COVID protocols and [facility] guidelines

## 2020-03-27 ENCOUNTER — Encounter: Payer: Self-pay | Admitting: Gastroenterology

## 2020-04-09 ENCOUNTER — Encounter: Payer: Self-pay | Admitting: Gastroenterology

## 2020-04-09 ENCOUNTER — Other Ambulatory Visit: Payer: Self-pay

## 2020-04-09 ENCOUNTER — Ambulatory Visit (AMBULATORY_SURGERY_CENTER): Payer: BC Managed Care – PPO | Admitting: Gastroenterology

## 2020-04-09 VITALS — BP 105/81 | HR 70 | Temp 96.2°F | Resp 11 | Ht 71.0 in | Wt 176.4 lb

## 2020-04-09 DIAGNOSIS — Z1211 Encounter for screening for malignant neoplasm of colon: Secondary | ICD-10-CM | POA: Diagnosis not present

## 2020-04-09 DIAGNOSIS — K621 Rectal polyp: Secondary | ICD-10-CM

## 2020-04-09 DIAGNOSIS — D128 Benign neoplasm of rectum: Secondary | ICD-10-CM

## 2020-04-09 MED ORDER — FLEET ENEMA 7-19 GM/118ML RE ENEM
1.0000 | ENEMA | Freq: Once | RECTAL | Status: AC
Start: 2020-04-09 — End: 2020-04-09
  Administered 2020-04-09: 1 via RECTAL

## 2020-04-09 MED ORDER — SODIUM CHLORIDE 0.9 % IV SOLN: 500.0000 mL | Freq: Once | INTRAVENOUS | Status: DC

## 2020-04-09 MED FILL — LISINOPRIL 5 MG TABLET: 5 | 90 days supply | Qty: 90 | Fill #1

## 2020-04-09 NOTE — Patient Instructions (Signed)
Handout on polyps, diverticulosis, hemorrhoids given Resume previous diet Continue current medications Await pathology results YOU HAD AN ENDOSCOPIC PROCEDURE TODAY AT [facility]:   Refer to the procedure report that was given to you for any specific questions about what was found during the examination.  If the procedure report does not answer your questions, please call your gastroenterologist to clarify.  If you requested that your care partner not be given the details of your procedure findings, then the procedure report has been included in a sealed envelope for you to review at your convenience later.  YOU SHOULD EXPECT: Some feelings of bloating in the abdomen. Passage of more gas than usual.  Walking can help get rid of the air that was put into your GI tract during the procedure and reduce the bloating. If you had a lower endoscopy (such as a colonoscopy or flexible sigmoidoscopy) you may notice spotting of blood in your stool or on the toilet paper. If you underwent a bowel prep for your procedure, you may not have a normal bowel movement for a few days.  Please Note:  You might notice some irritation and congestion in your nose or some drainage.  This is from the oxygen used during your procedure.  There is no need for concern and it should clear up in a day or so.  SYMPTOMS TO REPORT IMMEDIATELY:   Following lower endoscopy (colonoscopy or flexible sigmoidoscopy):  Excessive amounts of blood in the stool  Significant tenderness or worsening of abdominal pains  Swelling of the abdomen that is new, acute  Fever of 100F or higher  For urgent or emergent issues, a gastroenterologist can be reached at any hour by calling [phone]. Do not use MyChart messaging for urgent concerns.   DIET:  We do recommend a small meal at first, but then you may proceed to your regular diet.  Drink plenty of fluids but you should avoid alcoholic beverages for 24 hours.  ACTIVITY:   You should plan to take it easy for the rest of today and you should NOT DRIVE or use heavy machinery until tomorrow (because of the sedation medicines used during the test).    FOLLOW UP: Our staff will call the number listed on your records 48-72 hours following your procedure to check on you and address any questions or concerns that you may have regarding the information given to you following your procedure. If we do not reach you, we will leave a message.  We will attempt to reach you two times.  During this call, we will ask if you have developed any symptoms of COVID 19. If you develop any symptoms (ie: fever, flu-like symptoms, shortness of breath, cough etc.) before then, please call [phone].  If you test positive for Covid 19 in the 2 weeks post procedure, please call and report this information to [place].    If any biopsies were taken you will be contacted by phone or by letter within the next 1-3 weeks.  Please call us at [phone] if you have not heard about the biopsies in 3 weeks.   SIGNATURES/CONFIDENTIALITY: You and/or your care partner have signed paperwork which will be entered into your electronic medical record.  These signatures attest to the fact that that the information above on your After Visit Summary has been reviewed and is understood.  Full responsibility of the confidentiality of this discharge information lies with you and/or your care-partner.

## 2020-04-09 NOTE — Progress Notes (Signed)
Enema given to patient per Dr. Silverio Decamp.  Patient is clear/yellow now.

## 2020-04-09 NOTE — Progress Notes (Signed)
Called to room to assist during endoscopic procedure.  Patient ID and intended procedure confirmed with present staff. Received instructions for my participation in the procedure from the performing physician.  

## 2020-04-09 NOTE — Op Note (Signed)
[place] Patient Name: [name] Procedure Date: 04/09/2020 8:33 AM MRN: [id number] Endoscopist: Mauri Pole , MD Age: 50 Referring MD:  Date of Birth: [birth year] Gender: Male Account #: [id number] Procedure:                Colonoscopy Indications:              Screening for colorectal malignant neoplasm Medicines:                Monitored Anesthesia Care Procedure:                Pre-Anesthesia Assessment:                           - Prior to the procedure, a History and Physical                            was performed, and patient medications and                            allergies were reviewed. The patient's tolerance of                            previous anesthesia was also reviewed. The risks                            and benefits of the procedure and the sedation                            options and risks were discussed with the patient.                            All questions were answered, and informed consent                            was obtained. Prior Anticoagulants: The patient has                            taken no previous anticoagulant or antiplatelet                            agents. ASA Grade Assessment: III - A patient with                            severe systemic disease. After reviewing the risks                            and benefits, the patient was deemed in                            satisfactory condition to undergo the procedure.                           After obtaining informed consent, the colonoscope  was passed under direct vision. Throughout the                            procedure, the patient's blood pressure, pulse, and                            oxygen saturations were monitored continuously. The                            Olympus PCF-H190DL ([id number]) Colonoscope was                            introduced through the anus and advanced to the the                            cecum,  identified by appendiceal orifice and                            ileocecal valve. The colonoscopy was performed                            without difficulty. The patient tolerated the                            procedure well. The quality of the bowel                            preparation was adequate. The ileocecal valve,                            appendiceal orifice, and rectum were photographed. Scope In: 8:43:38 AM Scope Out: 8:58:17 AM Scope Withdrawal Time: 0 hours 8 minutes 56 seconds  Total Procedure Duration: 0 hours 14 minutes 39 seconds  Findings:                 The perianal and digital rectal examinations were                            normal.                           A less than 1 mm polyp was found in the rectum. The                            polyp was sessile. The polyp was removed with a                            cold biopsy forceps. Resection and retrieval were                            complete.                           A few small-mouthed diverticula were found in the  sigmoid colon.                           Non-bleeding external and internal hemorrhoids were                            found during retroflexion. The hemorrhoids were                            small. Complications:            No immediate complications. Estimated Blood Loss:     Estimated blood loss was minimal. Impression:               - One less than 1 mm polyp in the rectum, removed                            with a cold biopsy forceps. Resected and retrieved.                           - Diverticulosis in the sigmoid colon.                           - Non-bleeding external and internal hemorrhoids. Recommendation:           - Patient has a contact number available for                            emergencies. The signs and symptoms of potential                            delayed complications were discussed with the                            patient. Return to  normal activities tomorrow.                            Written discharge instructions were provided to the                            patient.                           - Resume previous diet.                           - Continue present medications.                           - Await pathology results.                           - Repeat colonoscopy in 5-10 years for surveillance                            based on pathology results. Mauri Pole, MD 04/09/2020 9:06:00 AM This report has been signed electronically.

## 2020-04-09 NOTE — Progress Notes (Signed)
Pt's states no medical or surgical changes since previsit or office visit. 

## 2020-04-09 NOTE — Progress Notes (Signed)
A and O x3. Report to RN. Tolerated MAC anesthesia well.

## 2020-04-11 ENCOUNTER — Telehealth: Payer: Self-pay

## 2020-04-11 NOTE — Telephone Encounter (Signed)
LVM

## 2020-04-15 ENCOUNTER — Encounter: Payer: Self-pay | Admitting: Gastroenterology

## 2020-04-15 DIAGNOSIS — E1169 Type 2 diabetes mellitus with other specified complication: Secondary | ICD-10-CM | POA: Diagnosis not present

## 2020-04-15 DIAGNOSIS — E782 Mixed hyperlipidemia: Secondary | ICD-10-CM | POA: Diagnosis not present

## 2020-04-16 ENCOUNTER — Other Ambulatory Visit (HOSPITAL_BASED_OUTPATIENT_CLINIC_OR_DEPARTMENT_OTHER): Payer: Self-pay | Admitting: *Deleted

## 2020-04-18 ENCOUNTER — Other Ambulatory Visit: Payer: Self-pay | Admitting: Family Medicine

## 2020-04-18 MED FILL — GLYXAMBI 25 MG-5 MG TABLET: 25-5 | 30 days supply | Qty: 30 | Fill #0

## 2020-04-18 MED FILL — ALFUZOSIN HCL ER 10 MG TB24: 10 | 30 days supply | Qty: 30 | Fill #2

## 2020-04-24 ENCOUNTER — Ambulatory Visit: Payer: BC Managed Care – PPO | Admitting: Neurology

## 2020-05-01 DIAGNOSIS — G35 Multiple sclerosis: Secondary | ICD-10-CM | POA: Diagnosis not present

## 2020-05-19 MED FILL — Empagliflozin-Linagliptin Tab 25-5 MG: ORAL | 30 days supply | Qty: 30 | Fill #0 | Status: AC

## 2020-05-19 MED FILL — Alfuzosin HCl Tab ER 24HR 10 MG: ORAL | 30 days supply | Qty: 30 | Fill #0 | Status: AC

## 2020-05-20 ENCOUNTER — Other Ambulatory Visit (HOSPITAL_BASED_OUTPATIENT_CLINIC_OR_DEPARTMENT_OTHER): Payer: Self-pay

## 2020-05-20 MED FILL — Omeprazole Cap Delayed Release 20 MG: ORAL | 90 days supply | Qty: 90 | Fill #0 | Status: AC

## 2020-05-21 ENCOUNTER — Other Ambulatory Visit (HOSPITAL_BASED_OUTPATIENT_CLINIC_OR_DEPARTMENT_OTHER): Payer: Self-pay

## 2020-06-12 DIAGNOSIS — G35 Multiple sclerosis: Secondary | ICD-10-CM | POA: Diagnosis not present

## 2020-06-19 ENCOUNTER — Other Ambulatory Visit (HOSPITAL_BASED_OUTPATIENT_CLINIC_OR_DEPARTMENT_OTHER): Payer: Self-pay

## 2020-06-19 MED FILL — Simvastatin Tab 40 MG: ORAL | 90 days supply | Qty: 90 | Fill #0 | Status: AC

## 2020-06-19 MED FILL — Alfuzosin HCl Tab ER 24HR 10 MG: ORAL | 30 days supply | Qty: 30 | Fill #1 | Status: AC

## 2020-06-19 MED FILL — Empagliflozin-Linagliptin Tab 25-5 MG: ORAL | 30 days supply | Qty: 30 | Fill #1 | Status: AC

## 2020-06-20 ENCOUNTER — Other Ambulatory Visit (HOSPITAL_BASED_OUTPATIENT_CLINIC_OR_DEPARTMENT_OTHER): Payer: Self-pay

## 2020-07-18 ENCOUNTER — Other Ambulatory Visit: Payer: Self-pay

## 2020-07-18 ENCOUNTER — Encounter: Payer: Self-pay | Admitting: Neurology

## 2020-07-18 ENCOUNTER — Ambulatory Visit (INDEPENDENT_AMBULATORY_CARE_PROVIDER_SITE_OTHER): Payer: BC Managed Care – PPO | Admitting: Neurology

## 2020-07-18 VITALS — BP 119/78 | HR 87 | Ht 71.0 in | Wt 173.0 lb

## 2020-07-18 DIAGNOSIS — R339 Retention of urine, unspecified: Secondary | ICD-10-CM | POA: Diagnosis not present

## 2020-07-18 DIAGNOSIS — Z79899 Other long term (current) drug therapy: Secondary | ICD-10-CM | POA: Diagnosis not present

## 2020-07-18 DIAGNOSIS — N138 Other obstructive and reflux uropathy: Secondary | ICD-10-CM | POA: Diagnosis not present

## 2020-07-18 DIAGNOSIS — R269 Unspecified abnormalities of gait and mobility: Secondary | ICD-10-CM

## 2020-07-18 DIAGNOSIS — G35 Multiple sclerosis: Secondary | ICD-10-CM

## 2020-07-18 DIAGNOSIS — F09 Unspecified mental disorder due to known physiological condition: Secondary | ICD-10-CM

## 2020-07-18 DIAGNOSIS — N529 Male erectile dysfunction, unspecified: Secondary | ICD-10-CM | POA: Diagnosis not present

## 2020-07-18 DIAGNOSIS — R3911 Hesitancy of micturition: Secondary | ICD-10-CM

## 2020-07-18 DIAGNOSIS — N401 Enlarged prostate with lower urinary tract symptoms: Secondary | ICD-10-CM | POA: Diagnosis not present

## 2020-07-18 DIAGNOSIS — G35D Multiple sclerosis, unspecified: Secondary | ICD-10-CM

## 2020-07-18 NOTE — Progress Notes (Signed)
[facility]  PATIENT: [name] DOB: [birth year]  REFERRING DOCTOR OR PCP:  Asencion Partridge Dohmeier SOURCE: patient, notes from Dr. Brett Fairy, imaging/lab reports and MRI images were personally reviewed  _________________________________   HISTORICAL  CHIEF COMPLAINT:  Chief Complaint  Patient presents with   Follow-up    RM 13, alone. Last seen 10/25/2019. On Tysabri. Last infusion: 06/12/20, next: 07/24/20. Last JCV ab positive, index: 0.44. Has to make sure to drink plenty of water day before infusion. Otherwise, he feels hung over after infusion.     HISTORY OF PRESENT ILLNESS:  [name] is a 50 y.o. man with relapsing remitting multiple sclerosis diagnosed in 1999.   Update 07/18/20: He is on Tysabri and tolerates it well.  He is mostly stable.   He is JCV Ab negative but has had some low positive readings and nw does every 6 months.   Last JCV was 0.44.     Gait is about the same but the right leg and arm feel a little heavier. This is worse with heat.    He denies any exacerbation but has noted a stuffed up sensation in his right ear like water in his ear.    He also notes more difficulty with the right side.   The right leg tightens up at times. He notes some clumsiness in the right foot while driving.   This is not a problem for short trips and he rarely drives longer distances.     He goes upstairs well but has some difficulty with downstairs.    He uses the [place].   He also notes difficulty going down slopes.[place]   He can walk over a mile without stopping.      Vision is doing about the same.   He notes right  bladder and bowel numbness - he has urgency of both and has had bladder incontinence.  He has a large prostate and is on tamsulosin and tadalafil.  He has hesitancy but thinks he empties.  He has seen urology.     Due to the coordination problems in the right leg he limits his driving.   The right leg sometimes falls off the gas pedal.    He  is having some cognitive issues, with word finding errors   He feels he struggles with conversation.  He feels focus is worse.   He notes fatigue as well.     He had his first Covid 19 vaccinations in the spring.  He is working from home now.       MS History: In 1999, he recalls having a GI virus and waking up the next day with numbness in his right arm.  He was referred to Dr. Marijean Bravo in [facility].   He had an LP showing elevated OCB and he was officially diagnosed with RRMS.    He started seeing Dr. Hart Robinsons at [facility] for a few years.  He was placed on Avonex and then Copaxone.   He tolerated the medications ok but did not note any improvements afterwards. He transferred care to Dr. Trula Ore who questioned the diagnosis of MS.   He stopped taking any DMT.   This July, on a hot day, he noticed tingling in his fingertips.  The next morning he woke up with right sided weakness and clumsiness in the right leg and numbness in the leg and arm.   He has not had much improvement since the onset of the new symptoms.  He saw Dr.  Dohmeier and had a repeat LP.  It showed OCB in the CSF and serum.   No change in walking afterwards.  He started Tysabri in 2019.  Initially he was JCV antibody negative.  A titer in 2021 was low positive but repeat study September 2021 was negative again.  Imaging: MRI images of the brain and cervical spine from 09/18/2017.   The MRI of the brain shows multiple T2/FLAIR hyperintense foci, mostly in the periventricular white matter radially oriented to the ventricles.  There are no lesions in the brainstem or cerebellum.  One small focus in the left parietal lobe enhanced after contrast.  There is generalized atrophy but the ventricles appear to be enlarged, mildly out of proportion to the extent of atrophy.  The MRI of the cervical spine showed 5-6 T2 hyperintense foci, most of which were posterolateral and none enhancing.  MRI of the brain and cervical spine 03/21/2019 did not show any new  lesions compared to 2019.  The enhancing lesion noted in 2019 no longer enhances.  CSF 10/27/2017 showed > 5 oligoclonal bands in CSF and additional bands in CSF and serum.   Protein was markedly elevated at 119.   Glucose slightly elevated (diabetic).  WBC elevated at 31 with 92% lymphs.   REVIEW OF SYSTEMS: Constitutional: No fevers, chills, sweats, or change in appetite.  He has fatigue.  There is some insomnia. Eyes: No visual changes, double vision, eye pain Ear, nose and throat: No hearing loss, ear pain, nasal congestion, sore throat Cardiovascular: No chest pain, palpitations Respiratory:  No shortness of breath at rest or with exertion.   No wheezes GastrointestinaI: No nausea, vomiting, diarrhea, abdominal pain, fecal incontinence Genitourinary:  No dysuria, urinary retention or frequency.  No nocturia. Musculoskeletal:  No neck pain, back pain Integumentary: No rash, pruritus, skin lesions Neurological: as above Psychiatric: No depression at this time though he had some a few months ago.  No anxiety Endocrine: No palpitations, diaphoresis, change in appetite, change in weigh or increased thirst Hematologic/Lymphatic:  No anemia, purpura, petechiae. Allergic/Immunologic: No itchy/runny eyes, nasal congestion, recent allergic reactions, rashes  ALLERGIES: No Known Allergies  HOME MEDICATIONS:  Current Outpatient Medications:    alfuzosin (UROXATRAL) 10 MG 24 hr tablet, Take 1 tablet by mouth daily., Disp: , Rfl:    alfuzosin (UROXATRAL) 10 MG 24 hr tablet, TAKE 1 TABLET (10 MG TOTAL) BY MOUTH DAILY., Disp: 30 tablet, Rfl: 11   aspirin EC 81 MG tablet, Take 81 mg by mouth daily., Disp: , Rfl:    Empagliflozin-linaGLIPtin 25-5 MG TABS, TAKE 1 TABLET BY MOUTH EVERY MORNING, Disp: 30 tablet, Rfl: 5   Empagliflozin-linaGLIPtin 25-5 MG TABS, TAKE 1 TABLET BY MOUTH EVERY MORNING, Disp: 30 tablet, Rfl: 5   glucose blood (CONTOUR NEXT TEST) test strip, one strip (1 each dose) by Other  route daily., Disp: , Rfl:    GLYXAMBI 25-5 MG TABS, Take 1 tablet by mouth every morning., Disp: , Rfl:    lisinopril (PRINIVIL,ZESTRIL) 5 MG tablet, , Disp: , Rfl: 2   lisinopril (ZESTRIL) 5 MG tablet, TAKE 1 TABLET BY MOUTH DAILY, Disp: 90 tablet, Rfl: 1   Multiple Vitamin (MULTIVITAMIN) tablet, Take 1 tablet by mouth daily., Disp: , Rfl:    natalizumab (TYSABRI) 300 MG/15ML injection, Inject 300 mg into the vein every 6 (six) weeks. Start form provided to [facility] on 12/06/17., Disp: , Rfl:    omeprazole (PRILOSEC) 20 MG capsule, TAKE 1 CAPSULE BY MOUTH ONCE DAILY, Disp:  90 capsule, Rfl: 3   simvastatin (ZOCOR) 40 MG tablet, TAKE ONE TABLET (40 MG DOSE) BY MOUTH DAILY., Disp: 90 tablet, Rfl: 3   simvastatin (ZOCOR) 40 MG tablet, TAKE 1 TABLET (40 MG TOTAL) BY MOUTH DAILY AT 6 PM., Disp: 90 tablet, Rfl: 3   simvastatin (ZOCOR) 40 MG tablet, TAKE 1 TABLET BY MOUTH ONCE DAILY, Disp: 90 tablet, Rfl: 1   tadalafil (CIALIS) 5 MG tablet, Take 5 mg by mouth daily as needed for erectile dysfunction., Disp: , Rfl:    tamsulosin (FLOMAX) 0.4 MG CAPS capsule, TAKE 1 CAPSULE (0.4 MG TOTAL) BY MOUTH DAILY., Disp: 90 capsule, Rfl: 1  Current Facility-Administered Medications:    0.9 %  sodium chloride infusion, 500 mL, Intravenous, Once, Nandigam, Venia Minks, MD  PAST MEDICAL HISTORY: Past Medical History:  Diagnosis Date   Allergy    Asthma    as a child   Diabetes mellitus without complication ([place])    Hyperlipidemia    Hypertension    Multiple sclerosis (HCC) 1999   Dr Trula Ore   Nephrolithiasis    sees urology    PAST SURGICAL HISTORY: Past Surgical History:  Procedure Laterality Date   APPENDECTOMY  2003   LITHOTRIPSY      FAMILY HISTORY: Family History  Problem Relation Age of Onset   Hypertension Mother    Alcohol abuse Father    Cancer Maternal Grandfather    Diabetes Maternal Aunt    Colon cancer Neg Hx    Esophageal cancer Neg Hx    Rectal cancer Neg Hx    Stomach  cancer Neg Hx     SOCIAL HISTORY:  Social History   Socioeconomic History   Marital status: Married    Spouse name: christy   Number of children: 2   Years of education: Not on file   Highest education level: Not on file  Occupational History   Occupation: IT    Comment: works in Scientist, research (life sciences) estate  Tobacco Use   Smoking status: Former    Packs/day: 1.00    Pack years: 0.00    Types: Cigarettes    Quit date: 10/28/2011    Years since quitting: 8.7   Smokeless tobacco: Never  Vaping Use   Vaping Use: Never used  Substance and Sexual Activity   Alcohol use: Yes    Comment: sometimes    Drug use: Yes    Types: Marijuana    Comment: last used 04/08/20   Sexual activity: Yes  Other Topics Concern   Not on file  Social History Narrative   Right handed    Caffeine use: Diet soda   Social Determinants of Health   Financial Resource Strain: Not on file  Food Insecurity: Not on file  Transportation Needs: Not on file  Physical Activity: Not on file  Stress: Not on file  Social Connections: Not on file  Intimate Partner Violence: Not on file     PHYSICAL EXAM  Vitals:   07/18/20 1543  BP: 119/78  Pulse: 87  SpO2: 98%  Weight: 173 lb (78.5 kg)  Height: 5\' 11"  (1.803 m)    Body mass index is 24.13 kg/m.   General: The patient is well-developed and well-nourished and in no acute distress   Skin: Extremities are without rash or edema.   Neurologic Exam  Mental status: The patient is alert and oriented x 3 at the time of the examination. The patient has apparent normal recent and remote memory, with an apparently normal  attention span and concentration ability.   Speech shows some pauses, with right word  Cranial nerves: Extraocular movements are full.  Color vision is symmetric.  Facial strength and sensation was normal.  Trapezius strength was normal. No obvious hearing deficits are noted.  Motor:  Muscle bulk is normal.  Muscle tone is increased in the right leg.   Strength was 5/5 except 4+/5 in the right EHL.  Sensory: Sensory testing shows reduced sensation to vibration and temperature in the right leg.   Normal in arm  Coordination: Cerebellar testing reveals good finger-nose-finger and slightly reduced rightt heel-to-shin .  Gait and station: Station is normal.  The gait is mildly wide and spastic.  Tandem gait is moderately wide..  Romberg is negative.  Reflexes: Deep tendon reflexes are increased, legs more than arms.  There are cross abductor reflexes at the knees and nonsustained clonus at the ankles.    DIAGNOSTIC DATA (LABS, IMAGING, TESTING) - I reviewed patient records, labs, notes, testing and imaging myself where available.  Lab Results  Component Value Date   WBC 6.7 03/14/2020   HGB 14.5 03/14/2020   HCT 42.1 03/14/2020   MCV 86 03/14/2020   PLT 232 03/14/2020      Component Value Date/Time   NA 140 10/17/2019 0000   K 4.3 10/17/2019 0000   CL 101 10/17/2019 0000   CO2 24 (A) 10/17/2019 0000   GLUCOSE 90 10/24/2018 1403   BUN 16 10/17/2019 0000   CREATININE 0.8 10/17/2019 0000   CREATININE 0.75 10/24/2018 1403   CALCIUM 10.0 10/17/2019 0000   PROT 7.4 10/24/2018 1403   PROT 7.5 11/18/2017 1023   ALBUMIN 4.8 10/17/2019 0000   ALBUMIN 5.2 11/18/2017 1023   AST 16 10/17/2019 0000   ALT 18 10/17/2019 0000   ALKPHOS 72 10/17/2019 0000   BILITOT 0.6 10/24/2018 1403   BILITOT 0.4 11/18/2017 1023   GFRNONAA 108 10/17/2019 0000   GFRNONAA 109 10/24/2018 1403   GFRAA 125 10/17/2019 0000   GFRAA 127 10/24/2018 1403   Lab Results  Component Value Date   CHOL 157 10/17/2019   HDL 31 (A) 10/17/2019   LDLCALC 103 10/17/2019   LDLDIRECT 111 (H) 02/15/2014   TRIG 125 10/17/2019   CHOLHDL 6.1 (H) 10/24/2018   Lab Results  Component Value Date   HGBA1C 7.0 10/17/2019      ASSESSMENT AND PLAN  1. Multiple sclerosis ([place])   2. High risk medication use   3. Cognitive deficit secondary to multiple sclerosis ([place])    4. Gait disturbance   5. Urinary hesitancy      1.   Continue Tysabri for relapsing remitting MS.   the JCV titer has been low positive and he has been on Tysabri every 6 weeks.    Check MRI of the brain later in the year.     2.   Continue tamsulosin for bladder hesitancy.   3.   Stay active and exercise as tolerated. 4.   He will return to see me in 6 months or sooner if there are new or worsening neurologic symptoms.    Jamirah Zelaya A. Felecia Shelling, MD, [facility] 08/29/5724, 2:03 PM Certified in Neurology, Clinical Neurophysiology, Sleep Medicine, Pain Medicine and Neuroimaging  [facility] [address], [place], [place] [address] [phone]

## 2020-07-23 ENCOUNTER — Other Ambulatory Visit (HOSPITAL_BASED_OUTPATIENT_CLINIC_OR_DEPARTMENT_OTHER): Payer: Self-pay

## 2020-07-23 ENCOUNTER — Telehealth: Payer: Self-pay | Admitting: *Deleted

## 2020-07-23 MED FILL — Alfuzosin HCl Tab ER 24HR 10 MG: ORAL | 30 days supply | Qty: 30 | Fill #2 | Status: AC

## 2020-07-23 MED FILL — Empagliflozin-Linagliptin Tab 25-5 MG: ORAL | 30 days supply | Qty: 30 | Fill #2 | Status: AC

## 2020-07-23 NOTE — Telephone Encounter (Signed)
Faxed completed/signed Tysabri pt status report and reauth questionnaire to [facility] touch at [phone]. Received confirmation.    Received fax notification back from touch program that pt re-auth for Tysabri from 07/23/20-02/23/21.  Pt enrollment number: [id number]. Account: [facility]. Site auth number: [id number]

## 2020-07-24 ENCOUNTER — Other Ambulatory Visit (HOSPITAL_BASED_OUTPATIENT_CLINIC_OR_DEPARTMENT_OTHER): Payer: Self-pay

## 2020-08-17 MED FILL — Omeprazole Cap Delayed Release 20 MG: ORAL | 90 days supply | Qty: 90 | Fill #1 | Status: AC

## 2020-08-17 MED FILL — Empagliflozin-Linagliptin Tab 25-5 MG: ORAL | 30 days supply | Qty: 30 | Fill #3 | Status: AC

## 2020-08-17 MED FILL — Alfuzosin HCl Tab ER 24HR 10 MG: ORAL | 30 days supply | Qty: 30 | Fill #3 | Status: AC

## 2020-08-19 ENCOUNTER — Other Ambulatory Visit (HOSPITAL_BASED_OUTPATIENT_CLINIC_OR_DEPARTMENT_OTHER): Payer: Self-pay

## 2020-09-20 MED FILL — Alfuzosin HCl Tab ER 24HR 10 MG: ORAL | 30 days supply | Qty: 30 | Fill #4 | Status: AC

## 2020-09-20 MED FILL — Simvastatin Tab 40 MG: ORAL | 90 days supply | Qty: 90 | Fill #1 | Status: AC

## 2020-09-20 MED FILL — Empagliflozin-Linagliptin Tab 25-5 MG: ORAL | 30 days supply | Qty: 30 | Fill #4 | Status: AC

## 2020-09-24 ENCOUNTER — Telehealth: Payer: Self-pay | Admitting: Neurology

## 2020-09-24 ENCOUNTER — Other Ambulatory Visit (HOSPITAL_BASED_OUTPATIENT_CLINIC_OR_DEPARTMENT_OTHER): Payer: Self-pay

## 2020-09-24 ENCOUNTER — Other Ambulatory Visit: Payer: BC Managed Care – PPO

## 2020-09-24 DIAGNOSIS — G35 Multiple sclerosis: Secondary | ICD-10-CM | POA: Diagnosis not present

## 2020-09-24 NOTE — Telephone Encounter (Signed)
Placed [facility] lab in [facility] lock box for routine lab pick up. Results pending. 

## 2020-09-25 LAB — CBC WITH DIFFERENTIAL/PLATELET
Basophils Absolute: 0.1 10*3/uL (ref 0.0–0.2)
Basos: 1 %
EOS (ABSOLUTE): 0.4 10*3/uL (ref 0.0–0.4)
Eos: 7 %
Hematocrit: 40.9 % (ref 37.5–51.0)
Hemoglobin: 14.3 g/dL (ref 13.0–17.7)
Immature Grans (Abs): 0 10*3/uL (ref 0.0–0.1)
Immature Granulocytes: 1 %
Lymphocytes Absolute: 2.2 10*3/uL (ref 0.7–3.1)
Lymphs: 39 %
MCH: 30.2 pg (ref 26.6–33.0)
MCHC: 35 g/dL (ref 31.5–35.7)
MCV: 86 fL (ref 79–97)
Monocytes Absolute: 0.5 10*3/uL (ref 0.1–0.9)
Monocytes: 8 %
Neutrophils Absolute: 2.6 10*3/uL (ref 1.4–7.0)
Neutrophils: 44 %
Platelets: 211 10*3/uL (ref 150–450)
RBC: 4.74 x10E6/uL (ref 4.14–5.80)
RDW: 13.8 % (ref 11.6–15.4)
WBC: 5.8 10*3/uL (ref 3.4–10.8)

## 2020-10-03 NOTE — Telephone Encounter (Signed)
Received results Index value: 0.33 JCV antibody: indeterminate

## 2020-10-11 DIAGNOSIS — G35 Multiple sclerosis: Secondary | ICD-10-CM | POA: Diagnosis not present

## 2020-10-11 DIAGNOSIS — R3914 Feeling of incomplete bladder emptying: Secondary | ICD-10-CM | POA: Diagnosis not present

## 2020-10-11 DIAGNOSIS — R339 Retention of urine, unspecified: Secondary | ICD-10-CM | POA: Diagnosis not present

## 2020-10-16 ENCOUNTER — Other Ambulatory Visit (HOSPITAL_BASED_OUTPATIENT_CLINIC_OR_DEPARTMENT_OTHER): Payer: Self-pay

## 2020-10-16 DIAGNOSIS — E119 Type 2 diabetes mellitus without complications: Secondary | ICD-10-CM | POA: Diagnosis not present

## 2020-10-16 DIAGNOSIS — E1169 Type 2 diabetes mellitus with other specified complication: Secondary | ICD-10-CM | POA: Diagnosis not present

## 2020-10-16 DIAGNOSIS — Z23 Encounter for immunization: Secondary | ICD-10-CM | POA: Diagnosis not present

## 2020-10-16 DIAGNOSIS — E782 Mixed hyperlipidemia: Secondary | ICD-10-CM | POA: Diagnosis not present

## 2020-10-16 MED ORDER — PEN NEEDLES 32G X 4 MM MISC
99 refills | Status: DC
  Filled 2020-10-16: qty 100, 90d supply, fill #0
  Filled 2021-01-20: qty 100, 90d supply, fill #1
  Filled 2021-04-06: qty 100, 90d supply, fill #2
  Filled 2021-07-03: qty 100, 90d supply, fill #3

## 2020-10-16 MED ORDER — TRESIBA FLEXTOUCH 200 UNIT/ML ~~LOC~~ SOPN
PEN_INJECTOR | SUBCUTANEOUS | 5 refills | Status: DC
  Filled 2020-10-16: qty 9, 90d supply, fill #0
  Filled 2021-01-20: qty 9, 90d supply, fill #1

## 2020-10-17 ENCOUNTER — Other Ambulatory Visit (HOSPITAL_BASED_OUTPATIENT_CLINIC_OR_DEPARTMENT_OTHER): Payer: Self-pay

## 2020-10-17 DIAGNOSIS — N529 Male erectile dysfunction, unspecified: Secondary | ICD-10-CM | POA: Diagnosis not present

## 2020-10-17 DIAGNOSIS — Z87442 Personal history of urinary calculi: Secondary | ICD-10-CM | POA: Diagnosis not present

## 2020-10-17 DIAGNOSIS — N401 Enlarged prostate with lower urinary tract symptoms: Secondary | ICD-10-CM | POA: Diagnosis not present

## 2020-10-17 DIAGNOSIS — Z125 Encounter for screening for malignant neoplasm of prostate: Secondary | ICD-10-CM | POA: Diagnosis not present

## 2020-10-17 MED ORDER — ROSUVASTATIN CALCIUM 40 MG PO TABS
ORAL_TABLET | ORAL | 3 refills | Status: DC
  Filled 2020-10-17: qty 90, 90d supply, fill #0
  Filled 2021-01-19: qty 90, 90d supply, fill #1
  Filled 2021-04-19: qty 90, 90d supply, fill #2
  Filled 2021-07-24: qty 90, 90d supply, fill #3

## 2020-10-22 ENCOUNTER — Other Ambulatory Visit (HOSPITAL_BASED_OUTPATIENT_CLINIC_OR_DEPARTMENT_OTHER): Payer: Self-pay

## 2020-10-22 MED FILL — Alfuzosin HCl Tab ER 24HR 10 MG: ORAL | 30 days supply | Qty: 30 | Fill #5 | Status: AC

## 2020-10-22 MED FILL — Lisinopril Tab 5 MG: ORAL | 90 days supply | Qty: 90 | Fill #0 | Status: AC

## 2020-11-05 ENCOUNTER — Other Ambulatory Visit: Payer: Self-pay | Admitting: Neurology

## 2020-11-05 DIAGNOSIS — G35 Multiple sclerosis: Secondary | ICD-10-CM | POA: Diagnosis not present

## 2020-11-06 DIAGNOSIS — M533 Sacrococcygeal disorders, not elsewhere classified: Secondary | ICD-10-CM | POA: Diagnosis not present

## 2020-11-06 DIAGNOSIS — G35 Multiple sclerosis: Secondary | ICD-10-CM | POA: Diagnosis not present

## 2020-11-06 DIAGNOSIS — W109XXA Fall (on) (from) unspecified stairs and steps, initial encounter: Secondary | ICD-10-CM | POA: Diagnosis not present

## 2020-11-11 ENCOUNTER — Other Ambulatory Visit (HOSPITAL_BASED_OUTPATIENT_CLINIC_OR_DEPARTMENT_OTHER): Payer: Self-pay

## 2020-11-11 ENCOUNTER — Ambulatory Visit (INDEPENDENT_AMBULATORY_CARE_PROVIDER_SITE_OTHER): Payer: BC Managed Care – PPO | Admitting: Sports Medicine

## 2020-11-11 ENCOUNTER — Ambulatory Visit (INDEPENDENT_AMBULATORY_CARE_PROVIDER_SITE_OTHER): Payer: BC Managed Care – PPO

## 2020-11-11 ENCOUNTER — Other Ambulatory Visit: Payer: Self-pay

## 2020-11-11 DIAGNOSIS — M533 Sacrococcygeal disorders, not elsewhere classified: Secondary | ICD-10-CM | POA: Diagnosis not present

## 2020-11-11 MED ORDER — TRAMADOL HCL 50 MG PO TABS
50.0000 mg | ORAL_TABLET | Freq: Three times a day (TID) | ORAL | 0 refills | Status: DC | PRN
  Filled 2020-11-11: qty 21, 4d supply, fill #0

## 2020-11-11 NOTE — Assessment & Plan Note (Signed)
This is a pleasant 50 year old male, 5 weeks ago he slipped and fell directly onto his bottom. Immediate pain directly at the tailbone and lower sacrum. On exam he still has severe tenderness at the top of the gluteal cleft over the sacrum and coccyx in the midline. Nothing overtly radicular that is new. Adding x-rays, he will use a donut pillow, tramadol for pain. Follow-up with me in 4 weeks.

## 2020-11-11 NOTE — Progress Notes (Signed)
    Procedures performed today:    None.  Independent interpretation of notes and tests performed by another provider:   None.  Brief History, Exam, Impression, and Recommendations:    Traumatic coccydynia This is a pleasant 50 year old male, 5 weeks ago he slipped and fell directly onto his bottom. Immediate pain directly at the tailbone and lower sacrum. On exam he still has severe tenderness at the top of the gluteal cleft over the sacrum and coccyx in the midline. Nothing overtly radicular that is new. Adding x-rays, he will use a donut pillow, tramadol for pain. Follow-up with me in 4 weeks.    ___________________________________________ Gwen Her. Dianah Field, M.D., ABFM., CAQSM. Primary Care and [place] Instructor of [place] of [facility] of Medicine

## 2020-11-21 ENCOUNTER — Other Ambulatory Visit (HOSPITAL_BASED_OUTPATIENT_CLINIC_OR_DEPARTMENT_OTHER): Payer: Self-pay

## 2020-11-21 MED FILL — Omeprazole Cap Delayed Release 20 MG: ORAL | 90 days supply | Qty: 90 | Fill #2 | Status: CN

## 2020-11-25 ENCOUNTER — Other Ambulatory Visit (HOSPITAL_BASED_OUTPATIENT_CLINIC_OR_DEPARTMENT_OTHER): Payer: Self-pay

## 2020-11-25 ENCOUNTER — Other Ambulatory Visit: Payer: Self-pay | Admitting: Urology

## 2020-11-25 MED ORDER — LISINOPRIL 5 MG PO TABS
5.0000 mg | ORAL_TABLET | Freq: Every day | ORAL | 1 refills | Status: DC
  Filled 2020-11-25 – 2021-01-19 (×2): qty 90, 90d supply, fill #0
  Filled 2021-04-19: qty 90, 90d supply, fill #1

## 2020-11-26 ENCOUNTER — Other Ambulatory Visit (HOSPITAL_BASED_OUTPATIENT_CLINIC_OR_DEPARTMENT_OTHER): Payer: Self-pay

## 2020-11-29 ENCOUNTER — Other Ambulatory Visit (HOSPITAL_BASED_OUTPATIENT_CLINIC_OR_DEPARTMENT_OTHER): Payer: Self-pay

## 2020-12-02 ENCOUNTER — Other Ambulatory Visit (HOSPITAL_BASED_OUTPATIENT_CLINIC_OR_DEPARTMENT_OTHER): Payer: Self-pay

## 2020-12-02 MED FILL — Tamsulosin HCl Cap 0.4 MG: ORAL | 90 days supply | Qty: 90 | Fill #0 | Status: AC

## 2020-12-02 MED FILL — Omeprazole Cap Delayed Release 20 MG: ORAL | 90 days supply | Qty: 90 | Fill #2 | Status: AC

## 2020-12-10 ENCOUNTER — Other Ambulatory Visit: Payer: Self-pay

## 2020-12-10 ENCOUNTER — Ambulatory Visit (INDEPENDENT_AMBULATORY_CARE_PROVIDER_SITE_OTHER): Payer: BC Managed Care – PPO | Admitting: Sports Medicine

## 2020-12-10 DIAGNOSIS — M533 Sacrococcygeal disorders, not elsewhere classified: Secondary | ICD-10-CM

## 2020-12-10 NOTE — Progress Notes (Signed)
    Procedures performed today:    None.  Independent interpretation of notes and tests performed by another provider:   None.  Brief History, Exam, Impression, and Recommendations:    Traumatic coccydynia This is a pleasant 50 year old male, he is approximately 9 weeks post a slip and fall directly onto his bottom, he had some pain directly over the tailbone and lower sacrum, today his pain is about 40% better, I am able to reproduce it with deep palpation over the coccyx. He tells me this week is better than last, so we will continue to watch this for now, if he has not fully improved by the follow-up visit in 4 weeks which will take him to 13 weeks post injury then we will do an intercoccygeal disc injection. In the meantime continue ibuprofen and donut pillow.    ___________________________________________ Gwen Her. Dianah Field, M.D., ABFM., CAQSM. Primary Care and [place] Instructor of [place] of [facility] of Medicine

## 2020-12-10 NOTE — Assessment & Plan Note (Signed)
This is a pleasant 50 year old male, he is approximately 9 weeks post a slip and fall directly onto his bottom, he had some pain directly over the tailbone and lower sacrum, today his pain is about 40% better, I am able to reproduce it with deep palpation over the coccyx. He tells me this week is better than last, so we will continue to watch this for now, if he has not fully improved by the follow-up visit in 4 weeks which will take him to 13 weeks post injury then we will do an intercoccygeal disc injection. In the meantime continue ibuprofen and donut pillow.

## 2020-12-18 ENCOUNTER — Encounter: Payer: BC Managed Care – PPO | Admitting: Family Medicine

## 2020-12-23 DIAGNOSIS — G35 Multiple sclerosis: Secondary | ICD-10-CM | POA: Diagnosis not present

## 2020-12-25 ENCOUNTER — Other Ambulatory Visit (HOSPITAL_BASED_OUTPATIENT_CLINIC_OR_DEPARTMENT_OTHER): Payer: Self-pay

## 2020-12-25 ENCOUNTER — Encounter: Payer: Self-pay | Admitting: Neurology

## 2020-12-25 ENCOUNTER — Ambulatory Visit (INDEPENDENT_AMBULATORY_CARE_PROVIDER_SITE_OTHER): Payer: BC Managed Care – PPO | Admitting: Neurology

## 2020-12-25 VITALS — BP 124/66 | HR 100 | Ht 71.0 in | Wt 178.5 lb

## 2020-12-25 DIAGNOSIS — Z79899 Other long term (current) drug therapy: Secondary | ICD-10-CM

## 2020-12-25 DIAGNOSIS — F09 Unspecified mental disorder due to known physiological condition: Secondary | ICD-10-CM

## 2020-12-25 DIAGNOSIS — R3911 Hesitancy of micturition: Secondary | ICD-10-CM | POA: Diagnosis not present

## 2020-12-25 DIAGNOSIS — G35 Multiple sclerosis: Secondary | ICD-10-CM

## 2020-12-25 DIAGNOSIS — R269 Unspecified abnormalities of gait and mobility: Secondary | ICD-10-CM | POA: Diagnosis not present

## 2020-12-25 MED ORDER — ARMODAFINIL 200 MG PO TABS
ORAL_TABLET | ORAL | 5 refills | Status: DC
  Filled 2020-12-25: qty 30, 30d supply, fill #0
  Filled 2021-01-24: qty 30, 30d supply, fill #1
  Filled 2021-02-27: qty 30, 30d supply, fill #2
  Filled 2021-04-06: qty 30, 30d supply, fill #3
  Filled 2021-05-26: qty 30, 30d supply, fill #4

## 2020-12-25 NOTE — Progress Notes (Signed)
[facility]  PATIENT: [name] DOB: August 31, 1970  REFERRING DOCTOR OR PCP:  Asencion Partridge Dohmeier SOURCE: patient, notes from Dr. Brett Fairy, imaging/lab reports and MRI images were personally reviewed  _________________________________   HISTORICAL  CHIEF COMPLAINT:  Chief Complaint  Patient presents with   Follow-up    Rm 1, alone. Here for 6 month MS f/u, on Tysabri. Pt continues to have fatigue. "Takes less to get tired." First few steps requires stability. R foot drags.     HISTORY OF PRESENT ILLNESS:  [name] is a 50 y.o. man with relapsing remitting multiple sclerosis diagnosed in 1999.   Update 12/25/20: He is on Tysabri and tolerates it well.  He is mostly stable.   He is JCV Ab negative but has had some low positive readings and now does infusion every 6 months.   Last JCV was 0.44 in 09/2020.    He denies any exacerbation but has had mild worsening of some of his symptoms.  Gait is about the same but the right leg and arm feel a little heavier. His first step with the right foot usually drags.   Steps are more difficult, esp down but now starting to be an issue goig up as well.   He had a fall in October and had thee sacrum/coccyx x-rayed due to pain.     He can walk a mile without stopping.      Vision is doing about the same.  He is going to schedule his annual visit (has DM as well).    He notes right groin numbness - he has urgency of both and has had bladder incontinence.  He has a large prostate and is on tamsulosin and tadalafil.  He has hesitancy but thinks he empties.  He has seen urology.     Due to the coordination problems in the right leg he limits his driving  The right leg sometimes falls off the gas pedal.    He is having some cognitive issues, with word finding errors   He feels he struggles with conversation.  He feels focus is worse. Speecj is slower and he has word finding issues at times.    Sleep is worse.   He notes fatigue  as well.    but has noted a stuffed up sensation in his right ear like water in his ear.   MS History: In 1999, he recalls having a GI virus and waking up the next day with numbness in his right arm.  He was referred to Dr. Marijean Bravo in [facility]   He had an LP showing elevated OCB and he was officially diagnosed with RRMS.    He started seeing Dr. Hart Robinsons at [facility] for a few years.  He was placed on Avonex and then Copaxone.   He tolerated the medications ok but did not note any improvements afterwards. He transferred care to Dr. Trula Ore who questioned the diagnosis of MS.   He stopped taking any DMT.   This July, on a hot day, he noticed tingling in his fingertips.  The next morning he woke up with right sided weakness and clumsiness in the right leg and numbness in the leg and arm.   He has not had much improvement since the onset of the new symptoms.  He saw Dr. Brett Fairy and had a repeat LP.  It showed OCB in the CSF and serum.   No change in walking afterwards.  He started Tysabri in 2019.  Initially he  was JCV antibody negative.  A titer in 2021 was low positive but repeat study September 2021 was negative again.  Imaging: MRI images of the brain and cervical spine from 09/18/2017.   The MRI of the brain shows multiple T2/FLAIR hyperintense foci, mostly in the periventricular white matter radially oriented to the ventricles.  There are no lesions in the brainstem or cerebellum.  One small focus in the left parietal lobe enhanced after contrast.  There is generalized atrophy but the ventricles appear to be enlarged, mildly out of proportion to the extent of atrophy.  The MRI of the cervical spine showed 5-6 T2 hyperintense foci, most of which were posterolateral and none enhancing.  MRI of the brain and cervical spine 03/21/2019 did not show any new lesions compared to 2019.  The enhancing lesion noted in 2019 no longer enhances.  CSF 10/27/2017 showed > 5 oligoclonal bands in CSF and additional bands in  CSF and serum.   Protein was markedly elevated at 119.   Glucose slightly elevated (diabetic).  WBC elevated at 31 with 92% lymphs.   REVIEW OF SYSTEMS: Constitutional: No fevers, chills, sweats, or change in appetite.  He has fatigue.  There is some insomnia. Eyes: No visual changes, double vision, eye pain Ear, nose and throat: No hearing loss, ear pain, nasal congestion, sore throat Cardiovascular: No chest pain, palpitations Respiratory:  No shortness of breath at rest or with exertion.   No wheezes GastrointestinaI: No nausea, vomiting, diarrhea, abdominal pain, fecal incontinence Genitourinary:  No dysuria, urinary retention or frequency.  No nocturia. Musculoskeletal:  No neck pain, back pain Integumentary: No rash, pruritus, skin lesions Neurological: as above Psychiatric: No depression at this time though he had some a few months ago.  No anxiety Endocrine: No palpitations, diaphoresis, change in appetite, change in weigh or increased thirst Hematologic/Lymphatic:  No anemia, purpura, petechiae. Allergic/Immunologic: No itchy/runny eyes, nasal congestion, recent allergic reactions, rashes  ALLERGIES: Allergies  Allergen Reactions   Hazelnut ([place]) Allergy Skin Test Anaphylaxis, Anxiety, Dermatitis, Itching, Rash and Shortness Of Breath    HOME MEDICATIONS:  Current Outpatient Medications:    alfuzosin (UROXATRAL) 10 MG 24 hr tablet, Take 1 tablet by mouth daily., Disp: , Rfl:    Armodafinil 200 MG TABS, Take 1 tablet by mouth every morning, Disp: 30 tablet, Rfl: 5   aspirin EC 81 MG tablet, Take 81 mg by mouth daily., Disp: , Rfl:    glucose blood (CONTOUR NEXT TEST) test strip, one strip (1 each dose) by Other route daily., Disp: , Rfl:    insulin degludec (TRESIBA FLEXTOUCH) 200 UNIT/ML FlexTouch Pen, Inject 20 units into the skin once daily and as directed., Disp: 9 mL, Rfl: 5   Insulin Pen Needle (PEN NEEDLES) 32G X 4 MM MISC, Use with Tyler Aas FlexTouch U-200 pen.   One injection daily., Disp: 100 each, Rfl: 99   lisinopril (ZESTRIL) 5 MG tablet, TAKE 1 TABLET BY MOUTH DAILY, Disp: 90 tablet, Rfl: 1   Multiple Vitamin (MULTIVITAMIN) tablet, Take 1 tablet by mouth daily., Disp: , Rfl:    omeprazole (PRILOSEC) 20 MG capsule, TAKE 1 CAPSULE BY MOUTH ONCE DAILY, Disp: 90 capsule, Rfl: 3   rosuvastatin (CRESTOR) 40 MG tablet, Take 1 tablet by mouth once daily, Disp: 90 tablet, Rfl: 3   tadalafil (CIALIS) 5 MG tablet, Take 5 mg by mouth daily as needed for erectile dysfunction., Disp: , Rfl:    tamsulosin (FLOMAX) 0.4 MG CAPS capsule, TAKE 1 CAPSULE (0.4  MG TOTAL) BY MOUTH DAILY., Disp: 90 capsule, Rfl: 1  Current Facility-Administered Medications:    0.9 %  sodium chloride infusion, 500 mL, Intravenous, Once, Nandigam, Venia Minks, MD  PAST MEDICAL HISTORY: Past Medical History:  Diagnosis Date   Allergy    Asthma    as a child   Diabetes mellitus without complication ([place])    Hyperlipidemia    Hypertension    Multiple sclerosis (HCC) 1999   Dr Trula Ore   Nephrolithiasis    sees urology    PAST SURGICAL HISTORY: Past Surgical History:  Procedure Laterality Date   APPENDECTOMY  2003   LITHOTRIPSY      FAMILY HISTORY: Family History  Problem Relation Age of Onset   Hypertension Mother    Alcohol abuse Father    Cancer Maternal Grandfather    Diabetes Maternal Aunt    Colon cancer Neg Hx    Esophageal cancer Neg Hx    Rectal cancer Neg Hx    Stomach cancer Neg Hx     SOCIAL HISTORY:  Social History   Socioeconomic History   Marital status: Married    Spouse name: christy   Number of children: 2   Years of education: Not on file   Highest education level: Not on file  Occupational History   Occupation: IT    Comment: works in Scientist, research (life sciences) estate  Tobacco Use   Smoking status: Former    Packs/day: 1.00    Types: Cigarettes    Quit date: 10/28/2011    Years since quitting: 9.1   Smokeless tobacco: Never  Vaping Use   Vaping Use: Never  used  Substance and Sexual Activity   Alcohol use: Yes    Comment: sometimes    Drug use: Yes    Types: Marijuana    Comment: last used 04/08/20   Sexual activity: Yes  Other Topics Concern   Not on file  Social History Narrative   Right handed    Caffeine use: Diet soda   Social Determinants of Health   Financial Resource Strain: Not on file  Food Insecurity: Not on file  Transportation Needs: Not on file  Physical Activity: Not on file  Stress: Not on file  Social Connections: Not on file  Intimate Partner Violence: Not on file     PHYSICAL EXAM  Vitals:   12/25/20 0815  BP: 124/66  Pulse: 100  Weight: 178 lb 8 oz (81 kg)  Height: 5\' 11"  (1.803 m)    Body mass index is 24.9 kg/m.   General: The patient is well-developed and well-nourished and in no acute distress   Skin: Extremities are without rash or edema.   Neurologic Exam  Mental status: The patient is alert and oriented x 3 at the time of the examination. The patient has apparent normal recent and remote memory, with an apparently normal attention span and concentration ability.   Speech shows some pauses, with right word  Cranial nerves: Extraocular movements are full.  Color vision is symmetric.  Facial strength and sensation was normal.  Trapezius strength was normal. No obvious hearing deficits are noted.  Motor:  Muscle bulk is normal.  Muscle tone is increased in the right leg.  Strength was 5/5 except 4+/5 in the right EHL.  Sensory: Sensory testing shows reduced sensation to vibration and temperature in the right leg.   Normal in arm  Coordination: Cerebellar testing reveals good finger-nose-finger and slightly reduced rightt heel-to-shin .  Gait and station: Station is  normal.  The gait is mildly wide and spastic.  Tandem gait is moderately wide..  Romberg is negative.  Reflexes: Deep tendon reflexes are increased, legs more than arms.  There are cross abductor reflexes at the knees and  nonsustained clonus at the ankles.    DIAGNOSTIC DATA (LABS, IMAGING, TESTING) - I reviewed patient records, labs, notes, testing and imaging myself where available.  Lab Results  Component Value Date   WBC 5.8 09/24/2020   HGB 14.3 09/24/2020   HCT 40.9 09/24/2020   MCV 86 09/24/2020   PLT 211 09/24/2020      Component Value Date/Time   NA 140 10/17/2019 0000   K 4.3 10/17/2019 0000   CL 101 10/17/2019 0000   CO2 24 (A) 10/17/2019 0000   GLUCOSE 90 10/24/2018 1403   BUN 16 10/17/2019 0000   CREATININE 0.8 10/17/2019 0000   CREATININE 0.75 10/24/2018 1403   CALCIUM 10.0 10/17/2019 0000   PROT 7.4 10/24/2018 1403   PROT 7.5 11/18/2017 1023   ALBUMIN 4.8 10/17/2019 0000   ALBUMIN 5.2 11/18/2017 1023   AST 16 10/17/2019 0000   ALT 18 10/17/2019 0000   ALKPHOS 72 10/17/2019 0000   BILITOT 0.6 10/24/2018 1403   BILITOT 0.4 11/18/2017 1023   GFRNONAA 108 10/17/2019 0000   GFRNONAA 109 10/24/2018 1403   GFRAA 125 10/17/2019 0000   GFRAA 127 10/24/2018 1403   Lab Results  Component Value Date   CHOL 157 10/17/2019   HDL 31 (A) 10/17/2019   LDLCALC 103 10/17/2019   LDLDIRECT 111 (H) 02/15/2014   TRIG 125 10/17/2019   CHOLHDL 6.1 (H) 10/24/2018   Lab Results  Component Value Date   HGBA1C 7.0 10/17/2019      ASSESSMENT AND PLAN  1. Multiple sclerosis ([place])   2. Cognitive deficit secondary to multiple sclerosis ([place])   3. High risk medication use   4. Gait disturbance   5. Urinary hesitancy       1.   Continue Tysabri for relapsing remitting MS.   because of the low positive JCV antibody titer, we will infuse Tysabri every 6 weeks.    We will check MRI of the brain to determine if there is any subclinical progression.   2.   Continue tamsulosin for bladder hesitancy.   3.   Stay active and exercise as tolerated.  I will add armodafinil for his cognitive greater than physical fatigue and occasional sleepiness. 4.   He will return to see [place]  in 6 months or  sooner if there are new or worsening neurologic symptoms.  We will need to continue JCV antibody testing every 6 months or so.   Cheyene Hamric A. Felecia Shelling, MD, [facility] 64/01/5828, 9:40 AM Certified in Neurology, Clinical Neurophysiology, Sleep Medicine, Pain Medicine and Neuroimaging  [facility] [address] [place], [place] [address] [phone]

## 2020-12-30 ENCOUNTER — Other Ambulatory Visit: Payer: Self-pay

## 2020-12-30 ENCOUNTER — Ambulatory Visit (INDEPENDENT_AMBULATORY_CARE_PROVIDER_SITE_OTHER): Payer: BC Managed Care – PPO | Admitting: Family Medicine

## 2020-12-30 ENCOUNTER — Telehealth: Payer: Self-pay | Admitting: Neurology

## 2020-12-30 ENCOUNTER — Encounter: Payer: Self-pay | Admitting: Family Medicine

## 2020-12-30 VITALS — BP 104/64 | HR 89 | Ht 71.0 in | Wt 181.0 lb

## 2020-12-30 DIAGNOSIS — E782 Mixed hyperlipidemia: Secondary | ICD-10-CM

## 2020-12-30 DIAGNOSIS — Z0000 Encounter for general adult medical examination without abnormal findings: Secondary | ICD-10-CM

## 2020-12-30 DIAGNOSIS — Z23 Encounter for immunization: Secondary | ICD-10-CM | POA: Diagnosis not present

## 2020-12-30 NOTE — Telephone Encounter (Signed)
LVM for pt to call back to schedule  Dillon Bjork: [phone] (exp. 12/27/20 to 01/25/21)

## 2020-12-30 NOTE — Progress Notes (Signed)
CPE - Established Patient Office Visit  Subjective:  Patient ID: [name], male    DOB: March 26, 1970  Age: 50 y.o. MRN: [id number]  CC:  Chief Complaint  Patient presents with   Annual Exam     HPI [name] presents for CPE.  He still follows with Dr. Felecia Shelling for his MS he still has a lot of persistent numbness on the right side of his body.  He says he is really struggling with fatigue and low energy and brain fog.  He says Dr. Felecia Shelling recently started him on armodafinil 200 mg to see if it would be helpful.  He has had more difficulty at work.  He does a lot of training at work and is usually able to do about 2 sessions in the morning but that he is exhausted the rest of the afternoon and really struggles to complete the afternoon sessions right now he has been able to work from home which has been helpful.  He also follows with endocrinology for his diabetes.  They took him off of oral medications he was on Glyxambi and having some urinary frequency and admits to having accidents and switched him to insulin.  He is up to 40 units on Tresiba.  PSA done in June.    Past Medical History:  Diagnosis Date   Allergy    Asthma    as a child   Diabetes mellitus without complication ([place])    Hyperlipidemia    Hypertension    Multiple sclerosis (HCC) 1999   Dr Trula Ore   Nephrolithiasis    sees urology    Past Surgical History:  Procedure Laterality Date   APPENDECTOMY  2003   LITHOTRIPSY      Family History  Problem Relation Age of Onset   Hypertension Mother    Alcohol abuse Father    Cancer Maternal Grandfather    Diabetes Maternal Aunt    Colon cancer Neg Hx    Esophageal cancer Neg Hx    Rectal cancer Neg Hx    Stomach cancer Neg Hx     Social History   Socioeconomic History   Marital status: Married    Spouse name: christy   Number of children: 2   Years of education: Not on file   Highest education level: Not on file  Occupational History    Occupation: IT    Comment: works in Scientist, research (life sciences) estate  Tobacco Use   Smoking status: Former    Packs/day: 1.00    Types: Cigarettes    Quit date: 10/28/2011    Years since quitting: 9.1   Smokeless tobacco: Never  Vaping Use   Vaping Use: Never used  Substance and Sexual Activity   Alcohol use: Yes    Comment: sometimes    Drug use: Yes    Types: Marijuana    Comment: last used 04/08/20   Sexual activity: Yes  Other Topics Concern   Not on file  Social History Narrative   Right handed    Caffeine use: Diet soda   Social Determinants of Health   Financial Resource Strain: Not on file  Food Insecurity: Not on file  Transportation Needs: Not on file  Physical Activity: Not on file  Stress: Not on file  Social Connections: Not on file  Intimate Partner Violence: Not on file    Outpatient Medications Prior to Visit  Medication Sig Dispense Refill   alfuzosin (UROXATRAL) 10 MG 24 hr tablet Take 1 tablet by mouth daily.  Armodafinil 200 MG TABS Take 1 tablet by mouth every morning 30 tablet 5   aspirin EC 81 MG tablet Take 81 mg by mouth daily.     glucose blood (CONTOUR NEXT TEST) test strip one strip (1 each dose) by Other route daily.     insulin degludec (TRESIBA FLEXTOUCH) 200 UNIT/ML FlexTouch Pen Inject 20 units into the skin once daily and as directed. 9 mL 5   Insulin Pen Needle (PEN NEEDLES) 32G X 4 MM MISC Use with Tyler Aas FlexTouch U-200 pen.  One injection daily. 100 each 99   lisinopril (ZESTRIL) 5 MG tablet TAKE 1 TABLET BY MOUTH DAILY 90 tablet 1   Multiple Vitamin (MULTIVITAMIN) tablet Take 1 tablet by mouth daily.     omeprazole (PRILOSEC) 20 MG capsule TAKE 1 CAPSULE BY MOUTH ONCE DAILY 90 capsule 3   rosuvastatin (CRESTOR) 40 MG tablet Take 1 tablet by mouth once daily 90 tablet 3   tadalafil (CIALIS) 5 MG tablet Take 5 mg by mouth daily as needed for erectile dysfunction.     tamsulosin (FLOMAX) 0.4 MG CAPS capsule TAKE 1 CAPSULE (0.4 MG TOTAL) BY MOUTH DAILY.  90 capsule 1   Facility-Administered Medications Prior to Visit  Medication Dose Route Frequency Provider Last Rate Last Admin   0.9 %  sodium chloride infusion  500 mL Intravenous Once Nandigam, Venia Minks, MD        Allergies  Allergen Reactions   Hazelnut ([place]) Allergy Skin Test Anaphylaxis, Anxiety, Dermatitis, Itching, Rash and Shortness Of Breath    ROS Review of Systems    Objective:    Physical Exam  BP 104/64   Pulse 89   Ht 5\' 11"  (1.803 m)   Wt 181 lb (82.1 kg)   SpO2 99%   BMI 25.24 kg/m  Wt Readings from Last 3 Encounters:  12/30/20 181 lb (82.1 kg)  12/25/20 178 lb 8 oz (81 kg)  07/18/20 173 lb (78.5 kg)     Health Maintenance Due  Topic Date Due   FOOT EXAM  10/12/2017   COVID-19 Vaccine (3 - Pfizer risk series) 05/30/2019   OPHTHALMOLOGY EXAM  10/24/2019   HEMOGLOBIN A1C  04/15/2020    There are no preventive care reminders to display for this patient.  No results found for: TSH Lab Results  Component Value Date   WBC 5.8 09/24/2020   HGB 14.3 09/24/2020   HCT 40.9 09/24/2020   MCV 86 09/24/2020   PLT 211 09/24/2020   Lab Results  Component Value Date   NA 140 10/17/2019   K 4.3 10/17/2019   CO2 24 (A) 10/17/2019   GLUCOSE 90 10/24/2018   BUN 16 10/17/2019   CREATININE 0.8 10/17/2019   BILITOT 0.6 10/24/2018   ALKPHOS 72 10/17/2019   AST 16 10/17/2019   ALT 18 10/17/2019   PROT 7.4 10/24/2018   ALBUMIN 4.8 10/17/2019   CALCIUM 10.0 10/17/2019   Lab Results  Component Value Date   CHOL 157 10/17/2019   Lab Results  Component Value Date   HDL 31 (A) 10/17/2019   Lab Results  Component Value Date   LDLCALC 103 10/17/2019   Lab Results  Component Value Date   TRIG 125 10/17/2019   Lab Results  Component Value Date   CHOLHDL 6.1 (H) 10/24/2018   Lab Results  Component Value Date   HGBA1C 7.0 10/17/2019      Assessment & Plan:   Problem List Items Addressed This Visit  Other   Hyperlipidemia    Relevant Orders   COMPLETE METABOLIC PANEL WITH GFR   Lipid panel   Other Visit Diagnoses     Routine general medical examination at a health care facility    -  Primary   Relevant Orders   COMPLETE METABOLIC PANEL WITH GFR   Lipid panel   Hemoglobin A1c   Tdap vaccine greater than or equal to 7yo IM (Completed)   Need for tetanus, diphtheria, and acellular pertussis (Tdap) vaccine in patient of adolescent age or older       Relevant Orders   Tdap vaccine greater than or equal to 7yo IM (Completed)   Wellness examination          Given Tdap today.   Keep up a regular exercise program and make sure you are eating a healthy diet Try to eat 4 servings of dairy a day, or if you are lactose intolerant take a calcium with vitamin D daily.  Your vaccines are up to date.   No orders of the defined types were placed in this encounter.   Follow-up: Return in 6 months (on 06/30/2021) for wellnes.    Beatrice Lecher, MD

## 2020-12-31 ENCOUNTER — Other Ambulatory Visit (HOSPITAL_BASED_OUTPATIENT_CLINIC_OR_DEPARTMENT_OTHER): Payer: Self-pay

## 2021-01-03 DIAGNOSIS — Z0000 Encounter for general adult medical examination without abnormal findings: Secondary | ICD-10-CM | POA: Diagnosis not present

## 2021-01-03 DIAGNOSIS — E782 Mixed hyperlipidemia: Secondary | ICD-10-CM | POA: Diagnosis not present

## 2021-01-04 LAB — COMPLETE METABOLIC PANEL WITH GFR
AG Ratio: 2.2 (calc) (ref 1.0–2.5)
ALT: 17 U/L (ref 9–46)
AST: 17 U/L (ref 10–40)
Albumin: 5 g/dL (ref 3.6–5.1)
Alkaline phosphatase (APISO): 60 U/L (ref 36–130)
BUN: 16 mg/dL (ref 7–25)
CO2: 30 mmol/L (ref 20–32)
Calcium: 10.3 mg/dL (ref 8.6–10.3)
Chloride: 103 mmol/L (ref 98–110)
Creat: 0.82 mg/dL (ref 0.60–1.29)
Globulin: 2.3 g/dL (calc) (ref 1.9–3.7)
Glucose, Bld: 137 mg/dL — ABNORMAL HIGH (ref 65–99)
Potassium: 5.3 mmol/L (ref 3.5–5.3)
Sodium: 141 mmol/L (ref 135–146)
Total Bilirubin: 0.6 mg/dL (ref 0.2–1.2)
Total Protein: 7.3 g/dL (ref 6.1–8.1)
eGFR: 108 mL/min/{1.73_m2} (ref >=60)

## 2021-01-04 LAB — LIPID PANEL
Cholesterol: 128 mg/dL (ref <200)
HDL: 35 mg/dL — ABNORMAL LOW (ref >=40)
LDL Cholesterol (Calc): 78 mg/dL (calc)
Non-HDL Cholesterol (Calc): 93 mg/dL (calc) (ref <130)
Total CHOL/HDL Ratio: 3.7 (calc) (ref <5.0)
Triglycerides: 72 mg/dL (ref <150)

## 2021-01-04 LAB — HEMOGLOBIN A1C
Hgb A1c MFr Bld: 9.2 % of total Hgb — ABNORMAL HIGH (ref <5.7)
Mean Plasma Glucose: 217 mg/dL
eAG (mmol/L): 12 mmol/L

## 2021-01-06 NOTE — Progress Notes (Signed)
Hi [name], metabolic panel overall looks good.  LDL cholesterol looks fantastic.  A1c was up significantly at 9.2.  Please verify the dose on your [place].  And I would like to consider adding a pill to your regimen if you are okay with that.  But we absolutely have to get your sugars down.  And then I want you to follow-up with me in mid January so that we can make sure that your sugars are improving.

## 2021-01-07 ENCOUNTER — Ambulatory Visit: Payer: BC Managed Care – PPO | Admitting: Sports Medicine

## 2021-01-09 ENCOUNTER — Other Ambulatory Visit (HOSPITAL_BASED_OUTPATIENT_CLINIC_OR_DEPARTMENT_OTHER): Payer: Self-pay

## 2021-01-09 ENCOUNTER — Ambulatory Visit: Payer: BC Managed Care – PPO | Admitting: Sports Medicine

## 2021-01-09 DIAGNOSIS — J069 Acute upper respiratory infection, unspecified: Secondary | ICD-10-CM | POA: Diagnosis not present

## 2021-01-09 DIAGNOSIS — R059 Cough, unspecified: Secondary | ICD-10-CM | POA: Diagnosis not present

## 2021-01-09 MED ORDER — BUDESONIDE-FORMOTEROL FUMARATE 160-4.5 MCG/ACT IN AERO
INHALATION_SPRAY | RESPIRATORY_TRACT | 0 refills | Status: DC
  Filled 2021-01-09: qty 10.2, 30d supply, fill #0

## 2021-01-09 MED ORDER — AZITHROMYCIN 250 MG PO TABS
ORAL_TABLET | ORAL | 0 refills | Status: DC
  Filled 2021-01-09: qty 6, 5d supply, fill #0

## 2021-01-15 ENCOUNTER — Other Ambulatory Visit (HOSPITAL_BASED_OUTPATIENT_CLINIC_OR_DEPARTMENT_OTHER): Payer: Self-pay

## 2021-01-15 DIAGNOSIS — E1169 Type 2 diabetes mellitus with other specified complication: Secondary | ICD-10-CM | POA: Diagnosis not present

## 2021-01-15 DIAGNOSIS — E782 Mixed hyperlipidemia: Secondary | ICD-10-CM | POA: Diagnosis not present

## 2021-01-15 MED ORDER — JANUVIA 100 MG PO TABS
ORAL_TABLET | ORAL | 1 refills | Status: DC
  Filled 2021-01-15: qty 90, 90d supply, fill #0
  Filled 2021-04-06: qty 90, 90d supply, fill #1

## 2021-01-15 MED ORDER — TRESIBA FLEXTOUCH 200 UNIT/ML ~~LOC~~ SOPN
PEN_INJECTOR | SUBCUTANEOUS | 5 refills | Status: DC
  Filled 2021-01-15: qty 9, 45d supply, fill #0
  Filled 2021-02-27: qty 9, 45d supply, fill #1
  Filled 2021-04-19: qty 9, 45d supply, fill #2
  Filled 2021-05-21 – 2021-05-23 (×2): qty 9, 45d supply, fill #3
  Filled 2021-07-03 – 2021-07-07 (×2): qty 9, 45d supply, fill #4
  Filled 2021-08-14: qty 9, 45d supply, fill #5

## 2021-01-20 ENCOUNTER — Other Ambulatory Visit (HOSPITAL_BASED_OUTPATIENT_CLINIC_OR_DEPARTMENT_OTHER): Payer: Self-pay

## 2021-01-21 ENCOUNTER — Other Ambulatory Visit (HOSPITAL_BASED_OUTPATIENT_CLINIC_OR_DEPARTMENT_OTHER): Payer: Self-pay

## 2021-01-27 ENCOUNTER — Other Ambulatory Visit (HOSPITAL_BASED_OUTPATIENT_CLINIC_OR_DEPARTMENT_OTHER): Payer: Self-pay

## 2021-01-28 ENCOUNTER — Other Ambulatory Visit (HOSPITAL_BASED_OUTPATIENT_CLINIC_OR_DEPARTMENT_OTHER): Payer: Self-pay

## 2021-01-29 ENCOUNTER — Ambulatory Visit (INDEPENDENT_AMBULATORY_CARE_PROVIDER_SITE_OTHER): Payer: BC Managed Care – PPO | Admitting: Sports Medicine

## 2021-01-29 ENCOUNTER — Other Ambulatory Visit: Payer: Self-pay

## 2021-01-29 DIAGNOSIS — M533 Sacrococcygeal disorders, not elsewhere classified: Secondary | ICD-10-CM

## 2021-01-29 NOTE — Assessment & Plan Note (Signed)
This is a very pleasant 51 year old male, he is now approximately 13 weeks post slip and fall directly onto the bottom, initially had some pain, he has improved progressively, has a bit of discomfort here and there but most days are pain-free. At this point we will simply observe, return as needed, if any worsening of pain we can certainly try intercoccygeal disc injections. Return as needed.

## 2021-01-29 NOTE — Progress Notes (Signed)
° ° °  Procedures performed today:    None.  Independent interpretation of notes and tests performed by another provider:   None.  Brief History, Exam, Impression, and Recommendations:    Traumatic coccydynia This is a very pleasant 51 year old male, he is now approximately 13 weeks post slip and fall directly onto the bottom, initially had some pain, he has improved progressively, has a bit of discomfort here and there but most days are pain-free. At this point we will simply observe, return as needed, if any worsening of pain we can certainly try intercoccygeal disc injections. Return as needed.    ___________________________________________ Gwen Her. Dianah Field, M.D., ABFM., CAQSM. Primary Care and [place] Instructor of [facility] of Medicine

## 2021-02-27 ENCOUNTER — Other Ambulatory Visit: Payer: Self-pay | Admitting: Family Medicine

## 2021-02-27 ENCOUNTER — Other Ambulatory Visit (HOSPITAL_BASED_OUTPATIENT_CLINIC_OR_DEPARTMENT_OTHER): Payer: Self-pay

## 2021-02-27 DIAGNOSIS — N401 Enlarged prostate with lower urinary tract symptoms: Secondary | ICD-10-CM

## 2021-02-27 DIAGNOSIS — N138 Other obstructive and reflux uropathy: Secondary | ICD-10-CM

## 2021-02-27 MED ORDER — TAMSULOSIN HCL 0.4 MG PO CAPS
ORAL_CAPSULE | Freq: Every day | ORAL | 1 refills | Status: DC
  Filled 2021-02-27: qty 90, 90d supply, fill #0
  Filled 2021-05-26: qty 90, 90d supply, fill #1

## 2021-02-27 MED FILL — Omeprazole Cap Delayed Release 20 MG: ORAL | 90 days supply | Qty: 90 | Fill #3 | Status: AC

## 2021-03-31 ENCOUNTER — Ambulatory Visit: Payer: BC Managed Care – PPO | Admitting: Neurology

## 2021-04-02 ENCOUNTER — Telehealth: Payer: Self-pay | Admitting: Neurology

## 2021-04-02 NOTE — Telephone Encounter (Signed)
Per intrafusion: The last time pt came for infusion was 12/23/20. He missed his appt and has not called back to r/s. Has occurred several times over the course of a year.  ? ? ?

## 2021-04-02 NOTE — Telephone Encounter (Signed)
LVM for pt asking he try and call our office back before 5pm today.  ?

## 2021-04-02 NOTE — Telephone Encounter (Signed)
Sent email to Michelle/[employer], providing update. ?

## 2021-04-02 NOTE — Telephone Encounter (Signed)
Michelle @ [employer]  has called to report that she has even reached out to [employer] in infusion suite and no one has been able to make contact with pt which makes him non compliant re: the Touch prescribing program .  Sharyn Lull is asking what needs to be done, if RN will reach out to pt.  Sharyn Lull can be reached at [phone] ?

## 2021-04-07 ENCOUNTER — Other Ambulatory Visit (HOSPITAL_BASED_OUTPATIENT_CLINIC_OR_DEPARTMENT_OTHER): Payer: Self-pay

## 2021-04-08 ENCOUNTER — Other Ambulatory Visit (HOSPITAL_BASED_OUTPATIENT_CLINIC_OR_DEPARTMENT_OTHER): Payer: Self-pay

## 2021-04-14 DIAGNOSIS — E1165 Type 2 diabetes mellitus with hyperglycemia: Secondary | ICD-10-CM | POA: Diagnosis not present

## 2021-04-14 DIAGNOSIS — G35 Multiple sclerosis: Secondary | ICD-10-CM | POA: Diagnosis not present

## 2021-04-14 DIAGNOSIS — R062 Wheezing: Secondary | ICD-10-CM | POA: Diagnosis not present

## 2021-04-14 DIAGNOSIS — E785 Hyperlipidemia, unspecified: Secondary | ICD-10-CM | POA: Diagnosis not present

## 2021-04-21 ENCOUNTER — Other Ambulatory Visit (HOSPITAL_BASED_OUTPATIENT_CLINIC_OR_DEPARTMENT_OTHER): Payer: Self-pay

## 2021-04-22 ENCOUNTER — Emergency Department
Admission: RE | Admit: 2021-04-22 | Discharge: 2021-04-22 | Disposition: A | Discharge status: Home / Self Care | Payer: BC Managed Care – PPO | Source: Ambulatory Visit

## 2021-04-22 NOTE — ED Triage Notes (Signed)
congestion x 3 weeks  ?Cough since Sunday  ?OTC - sudafed PE  ?Denies fever  ?

## 2021-04-23 ENCOUNTER — Telehealth: Payer: Self-pay | Admitting: Neurology

## 2021-04-23 ENCOUNTER — Encounter: Payer: Self-pay | Admitting: *Deleted

## 2021-04-23 NOTE — Telephone Encounter (Signed)
[employer] Sharyn Lull) have not been able to contact pt. Has not had natalizumab (TYSABRI) 300 MG/15ML injection infusion since 12/23/20. Unenrollment pending for 04/25/21. Would like a call from the nurse ?

## 2021-04-23 NOTE — Telephone Encounter (Signed)
Spoke w/ Maudie Mercury. Pt last infusion 12/2020. I called [employer] back and spoke w/ Lauren. Advised pt still unreachable.  ?Aware we LVM, sent mychart and will mail letter. She verbalized understanding.  ?

## 2021-04-23 NOTE — Telephone Encounter (Signed)
LVM for pt to call office back.  ? ?Waiting on update from intrafusion to see if he has come in for infusion recently and if so, when. ?

## 2021-05-07 ENCOUNTER — Other Ambulatory Visit (HOSPITAL_BASED_OUTPATIENT_CLINIC_OR_DEPARTMENT_OTHER): Payer: Self-pay

## 2021-05-07 DIAGNOSIS — E782 Mixed hyperlipidemia: Secondary | ICD-10-CM | POA: Diagnosis not present

## 2021-05-07 DIAGNOSIS — E1169 Type 2 diabetes mellitus with other specified complication: Secondary | ICD-10-CM | POA: Diagnosis not present

## 2021-05-07 MED ORDER — GLYXAMBI 25-5 MG PO TABS
1.0000 | ORAL_TABLET | Freq: Every day | ORAL | 3 refills | Status: DC
  Filled 2021-05-07: qty 30, 30d supply, fill #0

## 2021-05-09 ENCOUNTER — Other Ambulatory Visit (HOSPITAL_BASED_OUTPATIENT_CLINIC_OR_DEPARTMENT_OTHER): Payer: Self-pay

## 2021-05-09 MED ORDER — JANUVIA 100 MG PO TABS
ORAL_TABLET | ORAL | 1 refills | Status: DC
  Filled 2021-05-09 – 2021-07-29 (×2): qty 90, 90d supply, fill #0

## 2021-05-09 MED ORDER — TRESIBA FLEXTOUCH 200 UNIT/ML ~~LOC~~ SOPN
44.0000 [IU] | PEN_INJECTOR | Freq: Every day | SUBCUTANEOUS | 5 refills | Status: DC
  Filled 2021-05-09: qty 9, 40d supply, fill #0
  Filled 2021-09-25: qty 9, 41d supply, fill #0
  Filled 2021-10-30: qty 9, 41d supply, fill #1
  Filled 2021-12-15: qty 9, 41d supply, fill #2
  Filled 2022-01-29: qty 9, 41d supply, fill #3
  Filled 2022-03-09: qty 9, 41d supply, fill #4
  Filled 2022-04-17 – 2022-04-22 (×2): qty 9, 41d supply, fill #5

## 2021-05-16 ENCOUNTER — Other Ambulatory Visit (HOSPITAL_BASED_OUTPATIENT_CLINIC_OR_DEPARTMENT_OTHER): Payer: Self-pay

## 2021-05-22 ENCOUNTER — Other Ambulatory Visit (HOSPITAL_BASED_OUTPATIENT_CLINIC_OR_DEPARTMENT_OTHER): Payer: Self-pay

## 2021-05-23 ENCOUNTER — Other Ambulatory Visit (HOSPITAL_BASED_OUTPATIENT_CLINIC_OR_DEPARTMENT_OTHER): Payer: Self-pay

## 2021-05-26 ENCOUNTER — Other Ambulatory Visit: Payer: Self-pay | Admitting: Family Medicine

## 2021-05-26 ENCOUNTER — Other Ambulatory Visit (HOSPITAL_BASED_OUTPATIENT_CLINIC_OR_DEPARTMENT_OTHER): Payer: Self-pay

## 2021-05-26 MED ORDER — OMEPRAZOLE 20 MG PO CPDR
DELAYED_RELEASE_CAPSULE | Freq: Every day | ORAL | 3 refills | Status: DC
  Filled 2021-05-26: qty 90, 90d supply, fill #0
  Filled 2021-09-03: qty 90, 90d supply, fill #1

## 2021-05-27 ENCOUNTER — Other Ambulatory Visit (HOSPITAL_BASED_OUTPATIENT_CLINIC_OR_DEPARTMENT_OTHER): Payer: Self-pay

## 2021-05-27 DIAGNOSIS — G35 Multiple sclerosis: Secondary | ICD-10-CM | POA: Diagnosis not present

## 2021-05-27 MED ORDER — DIAZEPAM 5 MG PO TABS
ORAL_TABLET | ORAL | 0 refills | Status: DC
  Filled 2021-05-27: qty 2, 1d supply, fill #0

## 2021-05-28 ENCOUNTER — Other Ambulatory Visit (HOSPITAL_BASED_OUTPATIENT_CLINIC_OR_DEPARTMENT_OTHER): Payer: Self-pay

## 2021-06-03 DIAGNOSIS — E785 Hyperlipidemia, unspecified: Secondary | ICD-10-CM | POA: Diagnosis not present

## 2021-06-03 DIAGNOSIS — G35 Multiple sclerosis: Secondary | ICD-10-CM | POA: Diagnosis not present

## 2021-06-04 ENCOUNTER — Other Ambulatory Visit (HOSPITAL_BASED_OUTPATIENT_CLINIC_OR_DEPARTMENT_OTHER): Payer: Self-pay

## 2021-06-04 MED ORDER — VITAMIN D (ERGOCALCIFEROL) 1.25 MG (50000 UNIT) PO CAPS
ORAL_CAPSULE | ORAL | 0 refills | Status: DC
  Filled 2021-06-04 – 2021-07-09 (×2): qty 12, 84d supply, fill #0

## 2021-06-10 DIAGNOSIS — M47812 Spondylosis without myelopathy or radiculopathy, cervical region: Secondary | ICD-10-CM | POA: Diagnosis not present

## 2021-06-10 DIAGNOSIS — M5124 Other intervertebral disc displacement, thoracic region: Secondary | ICD-10-CM | POA: Diagnosis not present

## 2021-06-10 DIAGNOSIS — G35 Multiple sclerosis: Secondary | ICD-10-CM | POA: Diagnosis not present

## 2021-06-10 DIAGNOSIS — M4312 Spondylolisthesis, cervical region: Secondary | ICD-10-CM | POA: Diagnosis not present

## 2021-06-10 DIAGNOSIS — M47814 Spondylosis without myelopathy or radiculopathy, thoracic region: Secondary | ICD-10-CM | POA: Diagnosis not present

## 2021-06-10 DIAGNOSIS — R9082 White matter disease, unspecified: Secondary | ICD-10-CM | POA: Diagnosis not present

## 2021-06-10 DIAGNOSIS — M50322 Other cervical disc degeneration at C5-C6 level: Secondary | ICD-10-CM | POA: Diagnosis not present

## 2021-06-10 DIAGNOSIS — M5134 Other intervertebral disc degeneration, thoracic region: Secondary | ICD-10-CM | POA: Diagnosis not present

## 2021-06-12 ENCOUNTER — Other Ambulatory Visit (HOSPITAL_BASED_OUTPATIENT_CLINIC_OR_DEPARTMENT_OTHER): Payer: Self-pay

## 2021-06-26 ENCOUNTER — Ambulatory Visit: Payer: BC Managed Care – PPO | Admitting: Adult Health

## 2021-06-30 ENCOUNTER — Ambulatory Visit: Payer: BC Managed Care – PPO | Admitting: Family Medicine

## 2021-06-30 NOTE — Progress Notes (Deleted)
Complete physical exam  Patient: [name]   DOB: 04-23-1970   51 y.o. Male  MRN: [id number]  Subjective:    No chief complaint on file.   [name] is a 51 y.o. male who presents today for a complete physical exam. He reports consuming a {diet types:17450} diet. {types:19826} He generally feels {DESC; WELL/FAIRLY WELL/POORLY:18703}. He reports sleeping {DESC; WELL/FAIRLY WELL/POORLY:18703}. He {does/does not:200015} have additional problems to discuss today.    Most recent fall risk assessment:    12/19/2019    1:36 PM  [place] in the past year? 0  Risk for fall due to : No Fall Risks     Most recent depression screenings:    12/19/2019    1:36 PM 10/24/2018    1:17 PM  PHQ 2/9 Scores  PHQ - 2 Score 0 2  PHQ- 9 Score  11    {VISON DENTAL STD PSA (Optional):27386}  {History (Optional):23778}  Patient Care Team: Hali Marry, MD as PCP - General (Family Medicine) Susette Racer, MD as Referring Physician (Endocrinology)   Outpatient Medications Prior to Visit  Medication Sig  . alfuzosin (UROXATRAL) 10 MG 24 hr tablet Take 1 tablet by mouth daily.  . Armodafinil 200 MG TABS Take 1 tablet by mouth every morning  . aspirin EC 81 MG tablet Take 81 mg by mouth daily.  . diazepam (VALIUM) 5 MG tablet Take one tablet (5 mg dose) by mouth every 8 (eight) hours as needed for Anxiety or Sleep (to take before the MRI). Max Daily Amount: 15 mg  . Empagliflozin-linaGLIPtin (GLYXAMBI) 25-5 MG TABS Take one tablet by mouth once daily.  Marland Kitchen glucose blood (CONTOUR NEXT TEST) test strip one strip (1 each dose) by Other route daily.  . insulin degludec (TRESIBA FLEXTOUCH) 200 UNIT/ML FlexTouch Pen Inject 20 units into the skin once daily and as directed.  . insulin degludec (TRESIBA FLEXTOUCH) 200 UNIT/ML FlexTouch Pen Inject 40 Units into the skin daily.  . insulin degludec (TRESIBA FLEXTOUCH) 200 UNIT/ML FlexTouch Pen Inject 44 units into the skin  daily.  . Insulin Pen Needle (PEN NEEDLES) 32G X 4 MM MISC Use with Tyler Aas FlexTouch U-200 pen.  One injection daily.  Marland Kitchen lisinopril (ZESTRIL) 5 MG tablet TAKE 1 TABLET BY MOUTH DAILY  . Multiple Vitamin (MULTIVITAMIN) tablet Take 1 tablet by mouth daily.  . natalizumab (TYSABRI) 300 MG/15ML injection Inject into the vein.  Marland Kitchen omeprazole (PRILOSEC) 20 MG capsule TAKE 1 CAPSULE BY MOUTH ONCE DAILY  . rosuvastatin (CRESTOR) 40 MG tablet Take 1 tablet by mouth once daily  . sitaGLIPtin (JANUVIA) 100 MG tablet Take one tablet (100 mg dose) by mouth daily.  . tadalafil (CIALIS) 5 MG tablet Take 5 mg by mouth daily as needed for erectile dysfunction.  . tamsulosin (FLOMAX) 0.4 MG CAPS capsule TAKE 1 CAPSULE (0.4 MG TOTAL) BY MOUTH DAILY.  Marland Kitchen Vitamin D, Ergocalciferol, (DRISDOL) 1.25 MG (50000 UNIT) CAPS capsule Take one capsule (50,000 Units dose) by mouth every 7 (seven) days.   Facility-Administered Medications Prior to Visit  Medication Dose Route Frequency Provider  . 0.9 %  sodium chloride infusion  500 mL Intravenous Once Nandigam, Venia Minks, MD    ROS        Objective:     There were no vitals taken for this visit. {Vitals History (Optional):23777}  Physical Exam Constitutional:      Appearance: He is well-developed.  HENT:     Head: Normocephalic and  atraumatic.     Right Ear: External ear normal.     Left Ear: External ear normal.     Nose: Nose normal.  Eyes:     Conjunctiva/sclera: Conjunctivae normal.     Pupils: Pupils are equal, round, and reactive to light.  Neck:     Thyroid: No thyromegaly.  Cardiovascular:     Rate and Rhythm: Normal rate and regular rhythm.     Heart sounds: Normal heart sounds.  Pulmonary:     Effort: Pulmonary effort is normal.     Breath sounds: Normal breath sounds.  Abdominal:     General: Bowel sounds are normal. There is no distension.     Palpations: Abdomen is soft. There is no mass.     Tenderness: There is no abdominal  tenderness. There is no guarding or rebound.  Musculoskeletal:        General: Normal range of motion.     Cervical back: Normal range of motion and neck supple.  Lymphadenopathy:     Cervical: No cervical adenopathy.  Skin:    General: Skin is warm and dry.  Neurological:     Mental Status: He is alert and oriented to person, place, and time.     Deep Tendon Reflexes: Reflexes are normal and symmetric.  Psychiatric:        Behavior: Behavior normal.        Thought Content: Thought content normal.        Judgment: Judgment normal.     No results found for any visits on 06/30/21. {Show previous labs (optional):23779}    Assessment & Plan:    Routine Health Maintenance and Physical Exam  Immunization History  Administered Date(s) Administered  . Influenza, Seasonal, Injecte, Preservative Fre 10/04/2012, 11/14/2013  . Influenza,inj,Quad PF,6+ Mos 11/01/2012, 04/18/2015, 10/09/2015, 10/12/2016, 09/07/2017, 10/24/2018, 10/17/2019, 10/16/2020  . Influenza,inj,quad, With Preservative 10/09/2015, 10/12/2016, 10/17/2019  . PFIZER(Purple Top)SARS-COV-2 Vaccination 04/07/2019, 05/02/2019  . Pneumococcal Conjugate-13 04/03/2013  . Pneumococcal Polysaccharide-23 04/16/2016  . Tdap 07/02/2010, 12/30/2020    Health Maintenance  Topic Date Due  . Zoster Vaccines- Shingrix (1 of 2) Never done  . FOOT EXAM  10/12/2017  . COVID-19 Vaccine (3 - Pfizer risk series) 05/30/2019  . OPHTHALMOLOGY EXAM  10/24/2019  . HEMOGLOBIN A1C  07/04/2021  . INFLUENZA VACCINE  08/19/2021  . COLONOSCOPY (Pts 45-74yr Insurance coverage will need to be confirmed)  04/10/2030  . TETANUS/TDAP  12/31/2030  . Hepatitis C Screening  Completed  . HIV Screening  Completed  . Pneumococcal Vaccine 186675Years old  Aged Out  . HPV VACCINES  Aged Out    Discussed health benefits of physical activity, and encouraged him to engage in regular exercise appropriate for his age and condition.  Problem List Items  Addressed This Visit   None Visit Diagnoses     Wellness examination    -  Primary      No follow-ups on file.     CBeatrice Lecher MD

## 2021-07-03 ENCOUNTER — Other Ambulatory Visit: Payer: Self-pay | Admitting: Neurology

## 2021-07-04 ENCOUNTER — Other Ambulatory Visit (HOSPITAL_BASED_OUTPATIENT_CLINIC_OR_DEPARTMENT_OTHER): Payer: Self-pay

## 2021-07-04 DIAGNOSIS — E559 Vitamin D deficiency, unspecified: Secondary | ICD-10-CM | POA: Diagnosis not present

## 2021-07-04 DIAGNOSIS — G35 Multiple sclerosis: Secondary | ICD-10-CM | POA: Diagnosis not present

## 2021-07-07 ENCOUNTER — Other Ambulatory Visit (HOSPITAL_BASED_OUTPATIENT_CLINIC_OR_DEPARTMENT_OTHER): Payer: Self-pay

## 2021-07-09 ENCOUNTER — Other Ambulatory Visit (HOSPITAL_BASED_OUTPATIENT_CLINIC_OR_DEPARTMENT_OTHER): Payer: Self-pay

## 2021-07-24 ENCOUNTER — Other Ambulatory Visit (HOSPITAL_BASED_OUTPATIENT_CLINIC_OR_DEPARTMENT_OTHER): Payer: Self-pay

## 2021-07-24 MED ORDER — LISINOPRIL 5 MG PO TABS
5.0000 mg | ORAL_TABLET | Freq: Every day | ORAL | 1 refills | Status: DC
  Filled 2021-07-24: qty 90, 90d supply, fill #0
  Filled 2021-10-27: qty 90, 90d supply, fill #1

## 2021-07-29 ENCOUNTER — Other Ambulatory Visit (HOSPITAL_BASED_OUTPATIENT_CLINIC_OR_DEPARTMENT_OTHER): Payer: Self-pay

## 2021-07-31 ENCOUNTER — Ambulatory Visit: Payer: BC Managed Care – PPO | Admitting: Family Medicine

## 2021-08-05 DIAGNOSIS — G35 Multiple sclerosis: Secondary | ICD-10-CM | POA: Diagnosis not present

## 2021-08-06 DIAGNOSIS — G35 Multiple sclerosis: Secondary | ICD-10-CM | POA: Diagnosis not present

## 2021-08-07 DIAGNOSIS — G35 Multiple sclerosis: Secondary | ICD-10-CM | POA: Diagnosis not present

## 2021-08-14 ENCOUNTER — Other Ambulatory Visit (HOSPITAL_BASED_OUTPATIENT_CLINIC_OR_DEPARTMENT_OTHER): Payer: Self-pay

## 2021-09-03 ENCOUNTER — Other Ambulatory Visit (HOSPITAL_BASED_OUTPATIENT_CLINIC_OR_DEPARTMENT_OTHER): Payer: Self-pay

## 2021-09-03 ENCOUNTER — Other Ambulatory Visit: Payer: Self-pay | Admitting: Family Medicine

## 2021-09-03 DIAGNOSIS — N138 Other obstructive and reflux uropathy: Secondary | ICD-10-CM

## 2021-09-03 MED ORDER — TAMSULOSIN HCL 0.4 MG PO CAPS
0.4000 mg | ORAL_CAPSULE | Freq: Every day | ORAL | 1 refills | Status: DC
  Filled 2021-09-03: qty 90, 90d supply, fill #0
  Filled 2021-12-01: qty 90, 90d supply, fill #1

## 2021-09-04 ENCOUNTER — Ambulatory Visit (INDEPENDENT_AMBULATORY_CARE_PROVIDER_SITE_OTHER): Payer: BC Managed Care – PPO | Admitting: Family Medicine

## 2021-09-04 VITALS — BP 131/73 | HR 95 | Ht 71.0 in | Wt 179.0 lb

## 2021-09-04 DIAGNOSIS — E1121 Type 2 diabetes mellitus with diabetic nephropathy: Secondary | ICD-10-CM

## 2021-09-04 DIAGNOSIS — R809 Proteinuria, unspecified: Secondary | ICD-10-CM

## 2021-09-04 DIAGNOSIS — G35D Multiple sclerosis, unspecified: Secondary | ICD-10-CM

## 2021-09-04 DIAGNOSIS — Z23 Encounter for immunization: Secondary | ICD-10-CM | POA: Diagnosis not present

## 2021-09-04 DIAGNOSIS — E1129 Type 2 diabetes mellitus with other diabetic kidney complication: Secondary | ICD-10-CM

## 2021-09-04 DIAGNOSIS — G35 Multiple sclerosis: Secondary | ICD-10-CM | POA: Diagnosis not present

## 2021-09-04 LAB — POCT UA - MICROALBUMIN
Creatinine, POC: 50 mg/dL
Microalbumin Ur, POC: 10 mg/L

## 2021-09-04 LAB — POCT GLYCOSYLATED HEMOGLOBIN (HGB A1C): Hemoglobin A1C: 7.6 % — AB (ref 4.0–5.6)

## 2021-09-04 NOTE — Assessment & Plan Note (Signed)
Recent flare.  Followed at [facility].

## 2021-09-04 NOTE — Progress Notes (Signed)
Complete physical exam  Patient: [name]   DOB: [birth year]   51 y.o. Male  MRN: [id number]  Subjective:    Chief Complaint  Patient presents with   Diabetes   Follow-up    Diabetes - no hypoglycemic events. No wounds or sores that are not healing well. No increased thirst or urination. Checking glucose at home. Taking medications as prescribed without any side effects. He follows with endocrinology.  He is now on Tresiba 44 to 46 units daily as well as Januvia.  He is no longer on the Glyxambi.  He does need a referral to an eye doctor he would really like to see an ophthalmologist.  Reports home blood sugars have been running 1 20-135.  He unfortunately had a flare with his MS recently and was treated with 3 days of steroids.  Also like to get his shingles vaccine today.  Most recent fall risk assessment:    12/19/2019    1:36 PM  [place] in the past year? 0  Risk for fall due to : No Fall Risks     Most recent depression screenings:    12/19/2019    1:36 PM 10/24/2018    1:17 PM  PHQ 2/9 Scores  PHQ - 2 Score 0 2  PHQ- 9 Score  11        Patient Care Team: Hali Marry, MD as PCP - General (Family Medicine) Susette Racer, MD as Referring Physician (Endocrinology)   Outpatient Medications Prior to Visit  Medication Sig   alfuzosin (UROXATRAL) 10 MG 24 hr tablet Take 1 tablet by mouth daily.   aspirin EC 81 MG tablet Take 81 mg by mouth daily.   Empagliflozin-linaGLIPtin (GLYXAMBI) 25-5 MG TABS Take one tablet by mouth once daily.   glucose blood (CONTOUR NEXT TEST) test strip one strip (1 each dose) by Other route daily.   insulin degludec (TRESIBA FLEXTOUCH) 200 UNIT/ML FlexTouch Pen Inject 44 units into the skin daily.   Insulin Pen Needle (PEN NEEDLES) 32G X 4 MM MISC Use with Tyler Aas FlexTouch U-200 pen.  One injection daily.   lisinopril (ZESTRIL) 5 MG tablet TAKE 1 TABLET BY MOUTH DAILY   Multiple Vitamin (MULTIVITAMIN) tablet  Take 1 tablet by mouth daily.   natalizumab (TYSABRI) 300 MG/15ML injection Inject into the vein.   omeprazole (PRILOSEC) 20 MG capsule TAKE 1 CAPSULE BY MOUTH ONCE DAILY   rosuvastatin (CRESTOR) 40 MG tablet Take 1 tablet by mouth once daily   sitaGLIPtin (JANUVIA) 100 MG tablet Take one tablet (100 mg dose) by mouth daily.   tadalafil (CIALIS) 5 MG tablet Take 5 mg by mouth daily as needed for erectile dysfunction.   tamsulosin (FLOMAX) 0.4 MG CAPS capsule Take 1 capsule (0.4 mg total) by mouth daily.   Vitamin D, Ergocalciferol, (DRISDOL) 1.25 MG (50000 UNIT) CAPS capsule Take one capsule (50,000 Units dose) by mouth every 7 (seven) days.   [DISCONTINUED] Armodafinil 200 MG TABS Take 1 tablet by mouth every morning   [DISCONTINUED] diazepam (VALIUM) 5 MG tablet Take one tablet (5 mg dose) by mouth every 8 (eight) hours as needed for Anxiety or Sleep (to take before the MRI). Max Daily Amount: 15 mg   [DISCONTINUED] insulin degludec (TRESIBA FLEXTOUCH) 200 UNIT/ML FlexTouch Pen Inject 20 units into the skin once daily and as directed.   [DISCONTINUED] insulin degludec (TRESIBA FLEXTOUCH) 200 UNIT/ML FlexTouch Pen Inject 40 Units into the skin daily.   Facility-Administered Medications Prior to  Visit  Medication Dose Route Frequency Provider   0.9 %  sodium chloride infusion  500 mL Intravenous Once Nandigam, Kavitha V, MD    ROS        Objective:     BP 131/73   Pulse 95   Ht '5\' 11"'$  (1.803 m)   Wt 179 lb (81.2 kg)   SpO2 97%   BMI 24.97 kg/m    Physical Exam   Results for orders placed or performed in visit on 09/04/21  POCT UA - Microalbumin  Result Value Ref Range   Microalbumin Ur, POC 10 mg/L   Creatinine, POC 50 mg/dL   Albumin/Creatinine Ratio, Urine, POC 30-300   POCT HgB A1C  Result Value Ref Range   Hemoglobin A1C 7.6 (A) 4.0 - 5.6 %   HbA1c POC (<> result, manual entry)     HbA1c, POC (prediabetic range)     HbA1c, POC (controlled diabetic range)          Assessment & Plan:    Routine Health Maintenance and Physical Exam  Immunization History  Administered Date(s) Administered   Influenza, Seasonal, Injecte, Preservative Fre 10/04/2012, 11/14/2013   Influenza,inj,Quad PF,6+ Mos 11/01/2012, 04/18/2015, 10/09/2015, 10/12/2016, 09/07/2017, 10/24/2018, 10/17/2019, 10/16/2020   Influenza,inj,quad, With Preservative 10/09/2015, 10/12/2016, 10/17/2019   PFIZER(Purple Top)SARS-COV-2 Vaccination 04/07/2019, 05/02/2019   Pneumococcal Conjugate-13 04/03/2013   Pneumococcal Polysaccharide-23 04/16/2016   Pneumococcal-Unspecified 04/16/2016   Tdap 07/02/2010, 12/30/2020   Zoster Recombinat (Shingrix) 09/04/2021    Health Maintenance  Topic Date Due   COVID-19 Vaccine (3 - Pfizer risk series) 05/30/2019   OPHTHALMOLOGY EXAM  10/24/2019   INFLUENZA VACCINE  08/19/2021   Zoster Vaccines- Shingrix (2 of 2) 10/30/2021   Diabetic kidney evaluation - GFR measurement  01/03/2022   HEMOGLOBIN A1C  03/07/2022   Diabetic kidney evaluation - Urine ACR  09/05/2022   FOOT EXAM  09/05/2022   COLONOSCOPY (Pts 45-69yr Insurance coverage will need to be confirmed)  04/10/2030   TETANUS/TDAP  12/31/2030   Hepatitis C Screening  Completed   HIV Screening  Completed   Pneumococcal Vaccine 1876100Years old  Aged Out   HPV VACCINES  Aged Out    Discussed health benefits of physical activity, and encouraged him to engage in regular exercise appropriate for his age and condition.  Problem List Items Addressed This Visit       Endocrine   Diabetes mellitus, type 2 ([place] - Primary    A1C 7.6.  We did discuss the possibility of a GLP-1 as an option.  Discussed how the medication can be very effective.  May want to discuss with endocrine.  We did place referral for ophthalmology.  [place] to find something in his local area.      Relevant Orders   POCT UA - Microalbumin (Completed)   POCT HgB A1C (Completed)   Ambulatory referral  to Ophthalmology     Nervous and Auditory   Multiple sclerosis ([place]    Recent flare.  [place]      Other Visit Diagnoses     Need for shingles vaccine       Relevant Orders   Varicella-zoster vaccine IM (Completed)       Return in about 4 months (around 01/04/2022) for Wellness Exam.     CBeatrice Lecher MD

## 2021-09-04 NOTE — Assessment & Plan Note (Signed)
A1C 7.6.  We did discuss the possibility of a GLP-1 as an option.  Discussed how the medication can be very effective.  May want to discuss with endocrine.  We did place referral for ophthalmology.  He lives in [place] so we will try to find something in his local area.

## 2021-09-04 NOTE — Assessment & Plan Note (Signed)
Taking ACEi

## 2021-09-05 ENCOUNTER — Encounter: Payer: Self-pay | Admitting: Family Medicine

## 2021-09-05 DIAGNOSIS — E1121 Type 2 diabetes mellitus with diabetic nephropathy: Secondary | ICD-10-CM

## 2021-09-09 ENCOUNTER — Other Ambulatory Visit (HOSPITAL_BASED_OUTPATIENT_CLINIC_OR_DEPARTMENT_OTHER): Payer: Self-pay

## 2021-09-12 ENCOUNTER — Other Ambulatory Visit (HOSPITAL_BASED_OUTPATIENT_CLINIC_OR_DEPARTMENT_OTHER): Payer: Self-pay

## 2021-09-12 MED ORDER — TIRZEPATIDE 2.5 MG/0.5ML ~~LOC~~ SOAJ
2.5000 mg | SUBCUTANEOUS | 0 refills | Status: DC
  Filled 2021-09-12: qty 2, 28d supply, fill #0

## 2021-09-12 NOTE — Addendum Note (Signed)
Addended by: Beatrice Lecher D on: 09/12/2021 11:45 AM   Modules accepted: Orders

## 2021-09-25 ENCOUNTER — Other Ambulatory Visit (HOSPITAL_BASED_OUTPATIENT_CLINIC_OR_DEPARTMENT_OTHER): Payer: Self-pay

## 2021-09-30 DIAGNOSIS — H5203 Hypermetropia, bilateral: Secondary | ICD-10-CM | POA: Diagnosis not present

## 2021-09-30 DIAGNOSIS — H524 Presbyopia: Secondary | ICD-10-CM | POA: Diagnosis not present

## 2021-09-30 DIAGNOSIS — E119 Type 2 diabetes mellitus without complications: Secondary | ICD-10-CM | POA: Diagnosis not present

## 2021-09-30 DIAGNOSIS — H52223 Regular astigmatism, bilateral: Secondary | ICD-10-CM | POA: Diagnosis not present

## 2021-10-08 ENCOUNTER — Other Ambulatory Visit: Payer: Self-pay | Admitting: Family Medicine

## 2021-10-08 ENCOUNTER — Other Ambulatory Visit (HOSPITAL_BASED_OUTPATIENT_CLINIC_OR_DEPARTMENT_OTHER): Payer: Self-pay

## 2021-10-08 DIAGNOSIS — E1121 Type 2 diabetes mellitus with diabetic nephropathy: Secondary | ICD-10-CM

## 2021-10-09 ENCOUNTER — Other Ambulatory Visit (HOSPITAL_BASED_OUTPATIENT_CLINIC_OR_DEPARTMENT_OTHER): Payer: Self-pay

## 2021-10-14 ENCOUNTER — Other Ambulatory Visit (HOSPITAL_BASED_OUTPATIENT_CLINIC_OR_DEPARTMENT_OTHER): Payer: Self-pay

## 2021-10-14 MED ORDER — MOUNJARO 2.5 MG/0.5ML ~~LOC~~ SOAJ
2.5000 mg | SUBCUTANEOUS | 0 refills | Status: DC
  Filled 2021-10-14: qty 2, 28d supply, fill #0

## 2021-10-15 ENCOUNTER — Other Ambulatory Visit (HOSPITAL_BASED_OUTPATIENT_CLINIC_OR_DEPARTMENT_OTHER): Payer: Self-pay

## 2021-10-20 ENCOUNTER — Telehealth: Payer: Self-pay

## 2021-10-20 ENCOUNTER — Encounter: Payer: Self-pay | Admitting: Family Medicine

## 2021-10-20 NOTE — Telephone Encounter (Signed)
Initiated Prior authorization EVQ:[id number] 2.'5MG'$ /0.5ML pen-injectors Via: Covermymeds Case/Key:[id number] Status: approved as of 10/20/21 Reason:Effective from 10/20/2021 through 10/19/2022.  Notified Pt via: Mychart

## 2021-10-21 ENCOUNTER — Other Ambulatory Visit (HOSPITAL_BASED_OUTPATIENT_CLINIC_OR_DEPARTMENT_OTHER): Payer: Self-pay

## 2021-10-22 ENCOUNTER — Other Ambulatory Visit (HOSPITAL_BASED_OUTPATIENT_CLINIC_OR_DEPARTMENT_OTHER): Payer: Self-pay

## 2021-10-23 ENCOUNTER — Other Ambulatory Visit (HOSPITAL_BASED_OUTPATIENT_CLINIC_OR_DEPARTMENT_OTHER): Payer: Self-pay

## 2021-10-23 MED ORDER — TIRZEPATIDE 5 MG/0.5ML ~~LOC~~ SOAJ
5.0000 mg | SUBCUTANEOUS | 0 refills | Status: DC
  Filled 2021-10-23: qty 2, 28d supply, fill #0

## 2021-10-23 NOTE — Telephone Encounter (Signed)
OK, sent over higher dose. Pls notify pt of situation above and we will go ahead and go up  Meds ordered this encounter  Medications   tirzepatide (MOUNJARO) 5 MG/0.5ML Pen    Sig: Inject 5 mg into the skin once a week.    Dispense:  2 mL    Refill:  0

## 2021-10-27 ENCOUNTER — Other Ambulatory Visit (HOSPITAL_BASED_OUTPATIENT_CLINIC_OR_DEPARTMENT_OTHER): Payer: Self-pay

## 2021-10-28 ENCOUNTER — Other Ambulatory Visit (HOSPITAL_BASED_OUTPATIENT_CLINIC_OR_DEPARTMENT_OTHER): Payer: Self-pay

## 2021-10-28 MED ORDER — ROSUVASTATIN CALCIUM 40 MG PO TABS
40.0000 mg | ORAL_TABLET | Freq: Every day | ORAL | 3 refills | Status: DC
  Filled 2021-10-28: qty 90, 90d supply, fill #0
  Filled 2022-01-22: qty 90, 90d supply, fill #1
  Filled 2022-04-22: qty 90, 90d supply, fill #2
  Filled 2022-07-21: qty 90, 90d supply, fill #3

## 2021-10-30 ENCOUNTER — Other Ambulatory Visit (HOSPITAL_BASED_OUTPATIENT_CLINIC_OR_DEPARTMENT_OTHER): Payer: Self-pay

## 2021-11-18 ENCOUNTER — Other Ambulatory Visit (HOSPITAL_BASED_OUTPATIENT_CLINIC_OR_DEPARTMENT_OTHER): Payer: Self-pay

## 2021-11-18 ENCOUNTER — Other Ambulatory Visit: Payer: Self-pay | Admitting: Family Medicine

## 2021-11-18 MED ORDER — TECHLITE PEN NEEDLES 32G X 4 MM MISC
3 refills | Status: DC
  Filled 2021-11-18: qty 100, 90d supply, fill #0
  Filled 2022-03-05: qty 100, 90d supply, fill #1
  Filled 2022-05-19: qty 100, 90d supply, fill #2
  Filled 2022-08-19: qty 100, 90d supply, fill #3

## 2021-11-18 MED ORDER — MOUNJARO 5 MG/0.5ML ~~LOC~~ SOAJ
5.0000 mg | SUBCUTANEOUS | 0 refills | Status: DC
  Filled 2021-11-18: qty 2, 28d supply, fill #0

## 2021-11-24 ENCOUNTER — Ambulatory Visit (INDEPENDENT_AMBULATORY_CARE_PROVIDER_SITE_OTHER): Payer: BC Managed Care – PPO | Admitting: Family Medicine

## 2021-11-24 VITALS — BP 121/75 | HR 90 | Ht 71.0 in | Wt 171.0 lb

## 2021-11-24 DIAGNOSIS — Z23 Encounter for immunization: Secondary | ICD-10-CM

## 2021-11-24 NOTE — Progress Notes (Unsigned)
Pt presents to day for immunization. No allergy to eggs or latex.   Location: LD  Pt tolerated well.   

## 2021-11-25 NOTE — Progress Notes (Signed)
Agree with documentation as above.   Cheyene Hamric, MD  

## 2021-12-01 ENCOUNTER — Other Ambulatory Visit: Payer: Self-pay | Admitting: Family Medicine

## 2021-12-01 ENCOUNTER — Other Ambulatory Visit (HOSPITAL_BASED_OUTPATIENT_CLINIC_OR_DEPARTMENT_OTHER): Payer: Self-pay

## 2021-12-02 ENCOUNTER — Other Ambulatory Visit (HOSPITAL_BASED_OUTPATIENT_CLINIC_OR_DEPARTMENT_OTHER): Payer: Self-pay

## 2021-12-02 MED ORDER — ALFUZOSIN HCL ER 10 MG PO TB24
10.0000 mg | ORAL_TABLET | Freq: Every day | ORAL | 1 refills | Status: DC
  Filled 2021-12-02: qty 90, 90d supply, fill #0
  Filled 2022-03-03: qty 90, 90d supply, fill #1

## 2021-12-09 DIAGNOSIS — G35 Multiple sclerosis: Secondary | ICD-10-CM | POA: Diagnosis not present

## 2021-12-15 ENCOUNTER — Other Ambulatory Visit: Payer: Self-pay | Admitting: Family Medicine

## 2021-12-16 ENCOUNTER — Other Ambulatory Visit: Payer: Self-pay | Admitting: Family Medicine

## 2021-12-16 ENCOUNTER — Other Ambulatory Visit (HOSPITAL_BASED_OUTPATIENT_CLINIC_OR_DEPARTMENT_OTHER): Payer: Self-pay

## 2021-12-17 ENCOUNTER — Other Ambulatory Visit (HOSPITAL_BASED_OUTPATIENT_CLINIC_OR_DEPARTMENT_OTHER): Payer: Self-pay

## 2021-12-22 ENCOUNTER — Other Ambulatory Visit: Payer: Self-pay | Admitting: Family Medicine

## 2021-12-22 ENCOUNTER — Other Ambulatory Visit (HOSPITAL_BASED_OUTPATIENT_CLINIC_OR_DEPARTMENT_OTHER): Payer: Self-pay

## 2021-12-23 ENCOUNTER — Other Ambulatory Visit (HOSPITAL_BASED_OUTPATIENT_CLINIC_OR_DEPARTMENT_OTHER): Payer: Self-pay

## 2021-12-23 MED ORDER — MOUNJARO 5 MG/0.5ML ~~LOC~~ SOAJ
5.0000 mg | SUBCUTANEOUS | 0 refills | Status: DC
  Filled 2021-12-23: qty 2, 28d supply, fill #0

## 2022-01-02 DIAGNOSIS — G35 Multiple sclerosis: Secondary | ICD-10-CM | POA: Diagnosis not present

## 2022-01-05 ENCOUNTER — Ambulatory Visit (INDEPENDENT_AMBULATORY_CARE_PROVIDER_SITE_OTHER): Payer: BC Managed Care – PPO | Admitting: Family Medicine

## 2022-01-05 VITALS — BP 127/84 | HR 96 | Ht 71.0 in | Wt 168.0 lb

## 2022-01-05 DIAGNOSIS — Z0000 Encounter for general adult medical examination without abnormal findings: Secondary | ICD-10-CM | POA: Diagnosis not present

## 2022-01-05 DIAGNOSIS — Z23 Encounter for immunization: Secondary | ICD-10-CM

## 2022-01-05 NOTE — Progress Notes (Signed)
DMV form completed and returned to patient.  Encouraged him to speak with his neurologist about accommodations for work.

## 2022-01-05 NOTE — Progress Notes (Signed)
Complete physical exam  Patient: [name]   DOB: February 15, 1970   51 y.o. Male  MRN: [id number]  Subjective:    Chief Complaint  Patient presents with   Annual Exam    [name] is a 51 y.o. male who presents today for a complete physical exam. He reports consuming a dibetic diet.  The patient does not participate in regular exercise at present. He generally feels fairly well. He reports sleeping fairly well. He does not have additional problems to discuss today. Brought in [facility] form to be completed.     Most recent fall risk assessment:    12/19/2019    1:36 PM  [place] in the past year? 0  Risk for fall due to : No Fall Risks     Most recent depression screenings:    12/19/2019    1:36 PM 10/24/2018    1:17 PM  PHQ 2/9 Scores  PHQ - 2 Score 0 2  PHQ- 9 Score  11      Past Medical History:  Diagnosis Date   Allergy    Asthma    as a child   Diabetes mellitus without complication ([place])    Hyperlipidemia    Hypertension    Multiple sclerosis (HCC) 1999   Dr Trula Ore   Nephrolithiasis    sees urology   Past Surgical History:  Procedure Laterality Date   APPENDECTOMY  2003   LITHOTRIPSY     Allergies  Allergen Reactions   [place] ([place]) Allergy Skin Test Anaphylaxis, Anxiety, Dermatitis, Itching, Rash and Shortness Of Breath      Patient Care Team: Hali Marry, MD as PCP - General (Family Medicine) Susette Racer, MD as Referring Physician (Endocrinology)   Outpatient Medications Prior to Visit  Medication Sig   alfuzosin (UROXATRAL) 10 MG 24 hr tablet Take 1 tablet (10 mg total) by mouth daily.   aspirin EC 81 MG tablet Take 81 mg by mouth daily.   glucose blood (CONTOUR NEXT TEST) test strip one strip (1 each dose) by Other route daily.   insulin degludec (TRESIBA FLEXTOUCH) 200 UNIT/ML FlexTouch Pen Inject 44 Units into the skin daily.   Insulin Pen Needle (TECHLITE PEN NEEDLES) 32G X 4 MM MISC Use with Tyler Aas  FlexTouch U-200 pen. One injection daily.   lisinopril (ZESTRIL) 5 MG tablet TAKE 1 TABLET BY MOUTH DAILY   Multiple Vitamin (MULTIVITAMIN) tablet Take 1 tablet by mouth daily.   rosuvastatin (CRESTOR) 40 MG tablet Take 1 tablet (40 mg total) by mouth daily.   tadalafil (CIALIS) 5 MG tablet Take 5 mg by mouth daily as needed for erectile dysfunction.   tamsulosin (FLOMAX) 0.4 MG CAPS capsule Take 1 capsule (0.4 mg total) by mouth daily.   tirzepatide [facility]) 5 MG/0.5ML Pen Inject 5 mg into the skin once a week.   Vitamin D, Ergocalciferol, (DRISDOL) 1.25 MG (50000 UNIT) CAPS capsule Take one capsule (50,000 Units dose) by mouth every 7 (seven) days.   VUMERITY 231 MG CPDR Take 231 mg by mouth daily. Pt reports he's taking  4 tablets daily   Facility-Administered Medications Prior to Visit  Medication Dose Route Frequency Provider   0.9 %  sodium chloride infusion  500 mL Intravenous Once Nandigam, Kavitha V, MD    ROS        Objective:     BP 127/84   Pulse 96   Ht _0  (1.803 m)   Wt 168 lb (76.2 kg)  SpO2 99%   BMI 23.43 kg/m     Physical Exam Constitutional:      Appearance: He is well-developed.  HENT:     Head: Normocephalic and atraumatic.     Right Ear: Tympanic membrane, ear canal and external ear normal.     Left Ear: Tympanic membrane, ear canal and external ear normal.     Nose: Nose normal.     Mouth/Throat:     Pharynx: Oropharynx is clear.  Eyes:     Conjunctiva/sclera: Conjunctivae normal.     Pupils: Pupils are equal, round, and reactive to light.  Neck:     Thyroid: No thyromegaly.  Cardiovascular:     Rate and Rhythm: Normal rate and regular rhythm.     Heart sounds: Normal heart sounds.  Pulmonary:     Effort: Pulmonary effort is normal.     Breath sounds: Normal breath sounds.  Abdominal:     General: Bowel sounds are normal. There is no distension.     Palpations: Abdomen is soft. There is no mass.     Tenderness: There is no  abdominal tenderness. There is no guarding or rebound.  Musculoskeletal:        General: Normal range of motion.     Cervical back: Normal range of motion and neck supple. No tenderness.  Lymphadenopathy:     Cervical: No cervical adenopathy.  Skin:    General: Skin is warm and dry.  Neurological:     Mental Status: He is alert and oriented to person, place, and time.     Deep Tendon Reflexes: Reflexes are normal and symmetric.  Psychiatric:        Behavior: Behavior normal.        Thought Content: Thought content normal.        Judgment: Judgment normal.      No results found for any visits on 01/05/22.     Assessment & Plan:    Routine Health Maintenance and Physical Exam  Immunization History  Administered Date(s) Administered   Influenza, Seasonal, Injecte, Preservative Fre 10/04/2012, 11/14/2013   Influenza,inj,Quad PF,6+ Mos 11/01/2012, 04/18/2015, 10/09/2015, 10/12/2016, 09/07/2017, 10/24/2018, 10/17/2019, 10/16/2020, 01/05/2022   Influenza,inj,quad, With Preservative 10/09/2015, 10/12/2016, 10/17/2019   PFIZER(Purple Top)SARS-COV-2 Vaccination 04/07/2019, 05/02/2019   Pneumococcal Conjugate-13 04/03/2013   Pneumococcal Polysaccharide-23 04/16/2016   Pneumococcal-Unspecified 04/16/2016   Tdap 07/02/2010, 12/30/2020   Zoster Recombinat (Shingrix) 09/04/2021, 11/24/2021    Health Maintenance  Topic Date Due   OPHTHALMOLOGY EXAM  10/24/2019   Diabetic kidney evaluation - eGFR measurement  01/03/2022   COVID-19 Vaccine (3 - Pfizer risk series) 01/21/2022 (Originally 05/30/2019)   HEMOGLOBIN A1C  03/07/2022   Diabetic kidney evaluation - Urine ACR  09/05/2022   FOOT EXAM  09/05/2022   COLONOSCOPY (Pts 45-15yr Insurance coverage will need to be confirmed)  04/10/2030   DTaP/Tdap/Td (3 - Td or Tdap) 12/31/2030   INFLUENZA VACCINE  Completed   Hepatitis C Screening  Completed   HIV Screening  Completed   Zoster Vaccines- Shingrix  Completed   Pneumococcal Vaccine  [id number] old  Aged Out   HPV VACCINES  Aged Out    Discussed health benefits of physical activity, and encouraged him to engage in regular exercise appropriate for his age and condition.  Problem List Items Addressed This Visit   None Visit Diagnoses     Wellness examination    -  Primary   Relevant Orders   HgB A1c   Lipid Panel w/reflex Direct  LDL   COMPLETE METABOLIC PANEL WITH GFR     Keep up a regular exercise program and make sure you are eating a healthy diet Try to eat 4 servings of dairy a day, or if you are lactose intolerant take a calcium with vitamin D daily.  Your vaccines are up to date.   Return in about 14 weeks (around 04/13/2022) for Diabetes follow-up.     Beatrice Lecher, MD

## 2022-01-06 LAB — COMPLETE METABOLIC PANEL WITH GFR
AG Ratio: 2.2 (calc) (ref 1.0–2.5)
ALT: 23 U/L (ref 9–46)
AST: 24 U/L (ref 10–35)
Albumin: 5.6 g/dL — ABNORMAL HIGH (ref 3.6–5.1)
Alkaline phosphatase (APISO): 54 U/L (ref 35–144)
BUN/Creatinine Ratio: 22 (calc) (ref 6–22)
BUN: 14 mg/dL (ref 7–25)
CO2: 25 mmol/L (ref 20–32)
Calcium: 9.9 mg/dL (ref 8.6–10.3)
Chloride: 98 mmol/L (ref 98–110)
Creat: 0.64 mg/dL — ABNORMAL LOW (ref 0.70–1.30)
Globulin: 2.5 g/dL (calc) (ref 1.9–3.7)
Glucose, Bld: 140 mg/dL — ABNORMAL HIGH (ref 65–99)
Potassium: 3.8 mmol/L (ref 3.5–5.3)
Sodium: 136 mmol/L (ref 135–146)
Total Bilirubin: 0.7 mg/dL (ref 0.2–1.2)
Total Protein: 8.1 g/dL (ref 6.1–8.1)
eGFR: 115 mL/min/{1.73_m2} (ref >=60)

## 2022-01-06 LAB — LIPID PANEL W/REFLEX DIRECT LDL
Cholesterol: 112 mg/dL (ref <200)
HDL: 30 mg/dL — ABNORMAL LOW (ref >=40)
LDL Cholesterol (Calc): 62 mg/dL (calc)
Non-HDL Cholesterol (Calc): 82 mg/dL (calc) (ref <130)
Total CHOL/HDL Ratio: 3.7 (calc) (ref <5.0)
Triglycerides: 122 mg/dL (ref <150)

## 2022-01-06 LAB — HEMOGLOBIN A1C
Hgb A1c MFr Bld: 6.8 % of total Hgb — ABNORMAL HIGH (ref <5.7)
Mean Plasma Glucose: 148 mg/dL
eAG (mmol/L): 8.2 mmol/L

## 2022-01-06 NOTE — Progress Notes (Signed)
Brain, A1c looks much better at 6.8 compared to last time at 7.6.  Great progress!  You to work on [employer] and regular exercise.  Total cholesterol and LDL look great.  Metabolic panel is normal.  The only thing that I am concerned about is a slightly elevated albumin level.  A little unusual and it was normal a year ago so I would like to recheck that in about a month.

## 2022-01-16 ENCOUNTER — Other Ambulatory Visit: Payer: Self-pay | Admitting: Family Medicine

## 2022-01-16 ENCOUNTER — Other Ambulatory Visit (HOSPITAL_BASED_OUTPATIENT_CLINIC_OR_DEPARTMENT_OTHER): Payer: Self-pay

## 2022-01-16 MED ORDER — OMEPRAZOLE 20 MG PO CPDR
20.0000 mg | DELAYED_RELEASE_CAPSULE | Freq: Every day | ORAL | 3 refills | Status: DC
  Filled 2022-01-16: qty 90, 90d supply, fill #0
  Filled 2022-04-14 – 2022-04-22 (×2): qty 90, 90d supply, fill #1
  Filled 2022-07-16: qty 90, 90d supply, fill #2
  Filled 2022-10-19: qty 90, 90d supply, fill #3

## 2022-01-16 MED ORDER — MOUNJARO 5 MG/0.5ML ~~LOC~~ SOAJ
5.0000 mg | SUBCUTANEOUS | 0 refills | Status: DC
  Filled 2022-01-16: qty 2, 28d supply, fill #0
  Filled 2022-02-16: qty 2, 28d supply, fill #1
  Filled 2022-03-11 – 2022-03-17 (×2): qty 2, 28d supply, fill #2

## 2022-01-26 ENCOUNTER — Other Ambulatory Visit (HOSPITAL_BASED_OUTPATIENT_CLINIC_OR_DEPARTMENT_OTHER): Payer: Self-pay

## 2022-01-26 ENCOUNTER — Encounter: Payer: Self-pay | Admitting: Family Medicine

## 2022-01-26 DIAGNOSIS — E1121 Type 2 diabetes mellitus with diabetic nephropathy: Secondary | ICD-10-CM

## 2022-01-26 MED ORDER — LISINOPRIL 5 MG PO TABS
5.0000 mg | ORAL_TABLET | Freq: Every day | ORAL | 3 refills | Status: DC
  Filled 2022-01-26: qty 90, 90d supply, fill #0
  Filled 2022-04-22: qty 90, 90d supply, fill #1
  Filled 2022-07-21: qty 90, 90d supply, fill #2
  Filled 2022-08-19 – 2022-10-19 (×2): qty 90, 90d supply, fill #3

## 2022-03-02 ENCOUNTER — Encounter: Payer: Self-pay | Admitting: Family Medicine

## 2022-03-02 DIAGNOSIS — R770 Abnormality of albumin: Secondary | ICD-10-CM

## 2022-03-03 ENCOUNTER — Other Ambulatory Visit: Payer: Self-pay | Admitting: Family Medicine

## 2022-03-03 DIAGNOSIS — N138 Other obstructive and reflux uropathy: Secondary | ICD-10-CM

## 2022-03-03 NOTE — Telephone Encounter (Signed)
Attempted call to patient. Left voice mail message requesting a return call.

## 2022-03-03 NOTE — Telephone Encounter (Signed)
This call patient to verify.  We just received a prescription to refill Flomax (tamsulosin)but per our records it looks like he is currently on alfluzosin 59m.  Want to verify which 1 he is taking so that we can send refills and we can clean up the medication list.

## 2022-03-04 ENCOUNTER — Other Ambulatory Visit (HOSPITAL_BASED_OUTPATIENT_CLINIC_OR_DEPARTMENT_OTHER): Payer: Self-pay

## 2022-03-04 DIAGNOSIS — N529 Male erectile dysfunction, unspecified: Secondary | ICD-10-CM | POA: Diagnosis not present

## 2022-03-04 DIAGNOSIS — Z87442 Personal history of urinary calculi: Secondary | ICD-10-CM | POA: Diagnosis not present

## 2022-03-04 DIAGNOSIS — R338 Other retention of urine: Secondary | ICD-10-CM | POA: Diagnosis not present

## 2022-03-04 DIAGNOSIS — N401 Enlarged prostate with lower urinary tract symptoms: Secondary | ICD-10-CM | POA: Diagnosis not present

## 2022-03-04 DIAGNOSIS — R339 Retention of urine, unspecified: Secondary | ICD-10-CM | POA: Diagnosis not present

## 2022-03-04 DIAGNOSIS — N528 Other male erectile dysfunction: Secondary | ICD-10-CM | POA: Diagnosis not present

## 2022-03-04 DIAGNOSIS — N138 Other obstructive and reflux uropathy: Secondary | ICD-10-CM | POA: Diagnosis not present

## 2022-03-04 MED ORDER — OXYBUTYNIN CHLORIDE ER 10 MG PO TB24
10.0000 mg | ORAL_TABLET | Freq: Every day | ORAL | 3 refills | Status: DC
  Filled 2022-03-04: qty 90, 90d supply, fill #0
  Filled 2022-05-26 – 2022-06-10 (×2): qty 90, 90d supply, fill #1

## 2022-03-04 MED ORDER — MYRBETRIQ 25 MG PO TB24
25.0000 mg | ORAL_TABLET | Freq: Every day | ORAL | 3 refills | Status: DC
  Filled 2022-03-04: qty 90, 90d supply, fill #0
  Filled 2022-05-26 – 2022-06-09 (×3): qty 90, 90d supply, fill #1
  Filled 2022-06-10: qty 30, 30d supply, fill #1
  Filled 2022-06-11 – 2022-07-27 (×3): qty 90, 90d supply, fill #1

## 2022-03-05 NOTE — Telephone Encounter (Signed)
I spoke with [name]. He states he is not at home and is not sure which medication he is taking. He will call me back once he is at home with his medications. We can do a medication review by phone.

## 2022-03-10 ENCOUNTER — Other Ambulatory Visit (HOSPITAL_BASED_OUTPATIENT_CLINIC_OR_DEPARTMENT_OTHER): Payer: Self-pay

## 2022-03-11 ENCOUNTER — Other Ambulatory Visit: Payer: Self-pay | Admitting: *Deleted

## 2022-03-16 ENCOUNTER — Other Ambulatory Visit (HOSPITAL_BASED_OUTPATIENT_CLINIC_OR_DEPARTMENT_OTHER): Payer: Self-pay

## 2022-03-17 ENCOUNTER — Other Ambulatory Visit (HOSPITAL_BASED_OUTPATIENT_CLINIC_OR_DEPARTMENT_OTHER): Payer: Self-pay

## 2022-03-19 DIAGNOSIS — R770 Abnormality of albumin: Secondary | ICD-10-CM | POA: Diagnosis not present

## 2022-03-20 ENCOUNTER — Other Ambulatory Visit (HOSPITAL_BASED_OUTPATIENT_CLINIC_OR_DEPARTMENT_OTHER): Payer: Self-pay

## 2022-03-20 LAB — COMPLETE METABOLIC PANEL WITH GFR
AG Ratio: 2.2 (calc) (ref 1.0–2.5)
ALT: 31 U/L (ref 9–46)
AST: 23 U/L (ref 10–35)
Albumin: 5 g/dL (ref 3.6–5.1)
Alkaline phosphatase (APISO): 49 U/L (ref 35–144)
BUN/Creatinine Ratio: 16 (calc) (ref 6–22)
BUN: 11 mg/dL (ref 7–25)
CO2: 32 mmol/L (ref 20–32)
Calcium: 10.1 mg/dL (ref 8.6–10.3)
Chloride: 103 mmol/L (ref 98–110)
Creat: 0.68 mg/dL — ABNORMAL LOW (ref 0.70–1.30)
Globulin: 2.3 g/dL (calc) (ref 1.9–3.7)
Glucose, Bld: 100 mg/dL — ABNORMAL HIGH (ref 65–99)
Potassium: 4.5 mmol/L (ref 3.5–5.3)
Sodium: 141 mmol/L (ref 135–146)
Total Bilirubin: 0.4 mg/dL (ref 0.2–1.2)
Total Protein: 7.3 g/dL (ref 6.1–8.1)
eGFR: 113 mL/min/{1.73_m2} (ref >=60)

## 2022-03-20 NOTE — Progress Notes (Signed)
Mac, albumin is back to normal.  I am not sure what caused the temporary shift a couple of months ago but it is back to normal.  Baseline kidney function looks great.  Let us know when you had your last eye exam we do not seem to have the most up-to-date report in your chart.

## 2022-03-23 ENCOUNTER — Other Ambulatory Visit (HOSPITAL_BASED_OUTPATIENT_CLINIC_OR_DEPARTMENT_OTHER): Payer: Self-pay

## 2022-04-02 ENCOUNTER — Other Ambulatory Visit (HOSPITAL_BASED_OUTPATIENT_CLINIC_OR_DEPARTMENT_OTHER): Payer: Self-pay

## 2022-04-03 ENCOUNTER — Other Ambulatory Visit (HOSPITAL_BASED_OUTPATIENT_CLINIC_OR_DEPARTMENT_OTHER): Payer: Self-pay

## 2022-04-08 ENCOUNTER — Other Ambulatory Visit (HOSPITAL_BASED_OUTPATIENT_CLINIC_OR_DEPARTMENT_OTHER): Payer: Self-pay

## 2022-04-08 ENCOUNTER — Other Ambulatory Visit: Payer: Self-pay | Admitting: Family Medicine

## 2022-04-08 MED ORDER — TIRZEPATIDE 7.5 MG/0.5ML ~~LOC~~ SOAJ
7.5000 mg | SUBCUTANEOUS | 0 refills | Status: DC
  Filled 2022-04-08: qty 2, 28d supply, fill #0
  Filled 2022-05-04: qty 2, 28d supply, fill #1
  Filled 2022-06-01: qty 2, 28d supply, fill #2

## 2022-04-08 NOTE — Telephone Encounter (Signed)
Please call patient.  We received a request from his pharmacy for the [facility] but before I sent another 90-day supply I just wanted to see if he wanted to go up on his dose or just stay at his current dose until I see him back.

## 2022-04-08 NOTE — Telephone Encounter (Signed)
Meds ordered this encounter  Medications   tirzepatide (MOUNJARO) 7.5 MG/0.5ML Pen    Sig: Inject 7.5 mg into the skin once a week.    Dispense:  6 mL    Refill:  0

## 2022-04-13 ENCOUNTER — Ambulatory Visit: Payer: BC Managed Care – PPO | Admitting: Family Medicine

## 2022-04-13 DIAGNOSIS — E1121 Type 2 diabetes mellitus with diabetic nephropathy: Secondary | ICD-10-CM

## 2022-04-14 ENCOUNTER — Other Ambulatory Visit (HOSPITAL_BASED_OUTPATIENT_CLINIC_OR_DEPARTMENT_OTHER): Payer: Self-pay

## 2022-04-16 ENCOUNTER — Other Ambulatory Visit (HOSPITAL_BASED_OUTPATIENT_CLINIC_OR_DEPARTMENT_OTHER): Payer: Self-pay

## 2022-04-21 ENCOUNTER — Other Ambulatory Visit (HOSPITAL_BASED_OUTPATIENT_CLINIC_OR_DEPARTMENT_OTHER): Payer: Self-pay

## 2022-04-22 ENCOUNTER — Other Ambulatory Visit (HOSPITAL_BASED_OUTPATIENT_CLINIC_OR_DEPARTMENT_OTHER): Payer: Self-pay

## 2022-04-23 ENCOUNTER — Encounter: Payer: Self-pay | Admitting: Family Medicine

## 2022-04-23 ENCOUNTER — Ambulatory Visit (INDEPENDENT_AMBULATORY_CARE_PROVIDER_SITE_OTHER): Payer: BC Managed Care – PPO | Admitting: Family Medicine

## 2022-04-23 ENCOUNTER — Other Ambulatory Visit (HOSPITAL_BASED_OUTPATIENT_CLINIC_OR_DEPARTMENT_OTHER): Payer: Self-pay

## 2022-04-23 ENCOUNTER — Other Ambulatory Visit: Payer: Self-pay

## 2022-04-23 VITALS — BP 107/72 | HR 76 | Temp 98.3°F | Resp 18 | Ht 71.0 in | Wt 171.5 lb

## 2022-04-23 DIAGNOSIS — R4589 Other symptoms and signs involving emotional state: Secondary | ICD-10-CM

## 2022-04-23 DIAGNOSIS — R809 Proteinuria, unspecified: Secondary | ICD-10-CM

## 2022-04-23 DIAGNOSIS — E782 Mixed hyperlipidemia: Secondary | ICD-10-CM | POA: Diagnosis not present

## 2022-04-23 DIAGNOSIS — E1129 Type 2 diabetes mellitus with other diabetic kidney complication: Secondary | ICD-10-CM

## 2022-04-23 DIAGNOSIS — E1121 Type 2 diabetes mellitus with diabetic nephropathy: Secondary | ICD-10-CM | POA: Diagnosis not present

## 2022-04-23 LAB — POCT GLYCOSYLATED HEMOGLOBIN (HGB A1C): Hemoglobin A1C: 6.3 % — AB (ref 4.0–5.6)

## 2022-04-23 MED ORDER — TRESIBA FLEXTOUCH 200 UNIT/ML ~~LOC~~ SOPN: 34.0000 [IU] | PEN_INJECTOR | Freq: Every day | SUBCUTANEOUS | 0 refills | Status: DC

## 2022-04-23 NOTE — Assessment & Plan Note (Signed)
Last LDL at goal, less than 70.

## 2022-04-23 NOTE — Assessment & Plan Note (Signed)
While on GLP-1.  Just went up to 7.5 mg on the [facility] yesterday.  Will decrease the Tresiba from 44 units down to 34 units.  So down 10 units total.  If he starts having low blood sugars in about 2 to 3 weeks after bumping up on the [facility] we can always adjust down.  Encouraged him to reach out via MyChart.  We did go ahead and place a new referral for ophthalmology here in [place].

## 2022-04-23 NOTE — Patient Instructions (Signed)
Ok to decrease your [name] to 34 units daily

## 2022-04-23 NOTE — Progress Notes (Signed)
Established Patient Office Visit  Subjective   Patient ID: [name], male    DOB: Mar 02, 1970  Age: 52 y.o. MRN: [id number]  Chief Complaint  Patient presents with   Diabetes    Patient is here for his Diabetic follow up. Patient did not voice any concerns at this time.     HPI  Diabetes - no hypoglycemic events. No wounds or sores that are not healing well. No increased thirst or urination. Checking glucose at home. Taking medications as prescribed without any side effects.  Would like new referral for ophthalmology.    ROS    Objective:     BP 107/72   Pulse 76   Temp 98.3 F (36.8 C) (Oral)   Resp 18   Ht 5\' 11"  (1.803 m)   Wt 171 lb 8 oz (77.8 kg)   SpO2 100%   BMI 23.92 kg/m    Physical Exam Constitutional:      Appearance: He is well-developed.  HENT:     Head: Normocephalic and atraumatic.  Cardiovascular:     Rate and Rhythm: Normal rate and regular rhythm.     Heart sounds: Normal heart sounds.  Pulmonary:     Effort: Pulmonary effort is normal.     Breath sounds: Normal breath sounds.  Skin:    General: Skin is warm and dry.  Neurological:     Mental Status: He is alert and oriented to person, place, and time.  Psychiatric:        Behavior: Behavior normal.      Results for orders placed or performed in visit on 04/23/22  POCT HgB A1C  Result Value Ref Range   Hemoglobin A1C 6.3 (A) 4.0 - 5.6 %   HbA1c POC (<> result, manual entry)     HbA1c, POC (prediabetic range)     HbA1c, POC (controlled diabetic range)        The ASCVD Risk score (Arnett DK, et al., 2019) failed to calculate for the following reasons:   The valid total cholesterol range is 130 to 320 mg/dL    Assessment & Plan:   Problem List Items Addressed This Visit       Endocrine   Microalbuminuria due to type 2 diabetes mellitus    Continue lisinopril 5 mg daily.      Relevant Medications   insulin degludec (TRESIBA FLEXTOUCH) 200 UNIT/ML FlexTouch Pen    Diabetes mellitus, type 2 - Primary    While on GLP-1.  Just went up to 7.5 mg on the [facility] yesterday.  Will decrease the Tresiba from 44 units down to 34 units.  So down 10 units total.  If he starts having low blood sugars in about 2 to 3 weeks after bumping up on the [facility] we can always adjust down.  Encouraged him to reach out via MyChart.  We did go ahead and place a new referral for ophthalmology here in [place].      Relevant Medications   insulin degludec (TRESIBA FLEXTOUCH) 200 UNIT/ML FlexTouch Pen   Other Relevant Orders   POCT HgB A1C (Completed)   Ambulatory referral to Ophthalmology     Other   Hyperlipidemia    Last LDL at goal, less than 70.      Other Visit Diagnoses     Feeling down           Note, we have Myrbetriq and Ditropan on his med list.  He is not sure which 1 he is taking  but its only one of them.  One of them I think was not covered by insurance.  He will let us know next time and we can get his medication list cleared up.   Feeling down-he did have some symptoms of mild depression and anxiety.  He is not currently on medication.  He feels like a lot of it is related to dealing with his chronic health conditions.  Did encourage him to reach out if at any point he would like to do something more proactive such as medication or therapy.  Return in about 3 months (around 07/23/2022) for Diabetes follow-up.    Beatrice Lecher, MD

## 2022-04-23 NOTE — Assessment & Plan Note (Signed)
Continue lisinopril 5mg daily

## 2022-05-14 ENCOUNTER — Other Ambulatory Visit (HOSPITAL_COMMUNITY): Payer: Self-pay

## 2022-05-18 ENCOUNTER — Other Ambulatory Visit: Payer: Self-pay | Admitting: *Deleted

## 2022-05-18 ENCOUNTER — Other Ambulatory Visit (HOSPITAL_BASED_OUTPATIENT_CLINIC_OR_DEPARTMENT_OTHER): Payer: Self-pay

## 2022-05-18 ENCOUNTER — Other Ambulatory Visit: Payer: Self-pay | Admitting: Family Medicine

## 2022-05-18 DIAGNOSIS — N401 Enlarged prostate with lower urinary tract symptoms: Secondary | ICD-10-CM

## 2022-05-18 MED ORDER — TAMSULOSIN HCL 0.4 MG PO CAPS
0.4000 mg | ORAL_CAPSULE | Freq: Every day | ORAL | 1 refills | Status: DC
  Filled 2022-05-18: qty 90, 90d supply, fill #0
  Filled 2022-08-19: qty 90, 90d supply, fill #1

## 2022-05-26 ENCOUNTER — Other Ambulatory Visit (HOSPITAL_BASED_OUTPATIENT_CLINIC_OR_DEPARTMENT_OTHER): Payer: Self-pay

## 2022-05-26 ENCOUNTER — Other Ambulatory Visit: Payer: Self-pay

## 2022-06-01 ENCOUNTER — Other Ambulatory Visit (HOSPITAL_BASED_OUTPATIENT_CLINIC_OR_DEPARTMENT_OTHER): Payer: Self-pay

## 2022-06-01 ENCOUNTER — Other Ambulatory Visit: Payer: Self-pay | Admitting: Family Medicine

## 2022-06-02 ENCOUNTER — Other Ambulatory Visit (HOSPITAL_BASED_OUTPATIENT_CLINIC_OR_DEPARTMENT_OTHER): Payer: Self-pay

## 2022-06-03 ENCOUNTER — Other Ambulatory Visit (HOSPITAL_BASED_OUTPATIENT_CLINIC_OR_DEPARTMENT_OTHER): Payer: Self-pay

## 2022-06-03 MED ORDER — TRESIBA FLEXTOUCH 200 UNIT/ML ~~LOC~~ SOPN
34.0000 [IU] | PEN_INJECTOR | Freq: Every day | SUBCUTANEOUS | 1 refills | Status: DC
  Filled 2022-06-03: qty 9, 50d supply, fill #0
  Filled 2022-08-19: qty 9, 50d supply, fill #1

## 2022-06-08 ENCOUNTER — Other Ambulatory Visit (HOSPITAL_BASED_OUTPATIENT_CLINIC_OR_DEPARTMENT_OTHER): Payer: Self-pay

## 2022-06-09 ENCOUNTER — Other Ambulatory Visit (HOSPITAL_BASED_OUTPATIENT_CLINIC_OR_DEPARTMENT_OTHER): Payer: Self-pay

## 2022-06-10 ENCOUNTER — Other Ambulatory Visit (HOSPITAL_BASED_OUTPATIENT_CLINIC_OR_DEPARTMENT_OTHER): Payer: Self-pay

## 2022-06-10 ENCOUNTER — Other Ambulatory Visit: Payer: Self-pay

## 2022-06-11 ENCOUNTER — Other Ambulatory Visit (HOSPITAL_BASED_OUTPATIENT_CLINIC_OR_DEPARTMENT_OTHER): Payer: Self-pay

## 2022-06-22 ENCOUNTER — Encounter: Payer: Self-pay | Admitting: Family Medicine

## 2022-06-22 DIAGNOSIS — G35 Multiple sclerosis: Secondary | ICD-10-CM

## 2022-06-23 ENCOUNTER — Other Ambulatory Visit (HOSPITAL_BASED_OUTPATIENT_CLINIC_OR_DEPARTMENT_OTHER): Payer: Self-pay

## 2022-06-23 DIAGNOSIS — N401 Enlarged prostate with lower urinary tract symptoms: Secondary | ICD-10-CM | POA: Diagnosis not present

## 2022-06-23 DIAGNOSIS — R339 Retention of urine, unspecified: Secondary | ICD-10-CM | POA: Diagnosis not present

## 2022-06-23 DIAGNOSIS — N529 Male erectile dysfunction, unspecified: Secondary | ICD-10-CM | POA: Diagnosis not present

## 2022-06-23 DIAGNOSIS — Z87442 Personal history of urinary calculi: Secondary | ICD-10-CM | POA: Diagnosis not present

## 2022-06-23 NOTE — Telephone Encounter (Signed)
Okay for referral to [facility] for his MS.  Not clear what happened with the [facility] 1?

## 2022-06-23 NOTE — Telephone Encounter (Signed)
Referral ordered

## 2022-06-24 ENCOUNTER — Other Ambulatory Visit: Payer: Self-pay

## 2022-06-24 ENCOUNTER — Other Ambulatory Visit (HOSPITAL_BASED_OUTPATIENT_CLINIC_OR_DEPARTMENT_OTHER): Payer: Self-pay

## 2022-07-01 ENCOUNTER — Other Ambulatory Visit: Payer: Self-pay | Admitting: Family Medicine

## 2022-07-02 ENCOUNTER — Encounter: Payer: Self-pay | Admitting: Neurology

## 2022-07-02 ENCOUNTER — Other Ambulatory Visit (HOSPITAL_BASED_OUTPATIENT_CLINIC_OR_DEPARTMENT_OTHER): Payer: Self-pay

## 2022-07-02 MED ORDER — MOUNJARO 7.5 MG/0.5ML ~~LOC~~ SOAJ
7.5000 mg | SUBCUTANEOUS | 0 refills | Status: DC
  Filled 2022-07-02 – 2022-07-06 (×2): qty 2, 28d supply, fill #0
  Filled 2022-07-30: qty 2, 28d supply, fill #1

## 2022-07-06 ENCOUNTER — Other Ambulatory Visit (HOSPITAL_BASED_OUTPATIENT_CLINIC_OR_DEPARTMENT_OTHER): Payer: Self-pay

## 2022-07-16 ENCOUNTER — Other Ambulatory Visit (HOSPITAL_BASED_OUTPATIENT_CLINIC_OR_DEPARTMENT_OTHER): Payer: Self-pay

## 2022-07-24 ENCOUNTER — Ambulatory Visit: Payer: BC Managed Care – PPO | Admitting: Family Medicine

## 2022-07-27 ENCOUNTER — Other Ambulatory Visit (HOSPITAL_BASED_OUTPATIENT_CLINIC_OR_DEPARTMENT_OTHER): Payer: Self-pay

## 2022-07-31 DIAGNOSIS — E119 Type 2 diabetes mellitus without complications: Secondary | ICD-10-CM | POA: Diagnosis not present

## 2022-07-31 DIAGNOSIS — H25813 Combined forms of age-related cataract, bilateral: Secondary | ICD-10-CM | POA: Diagnosis not present

## 2022-07-31 DIAGNOSIS — H43813 Vitreous degeneration, bilateral: Secondary | ICD-10-CM | POA: Diagnosis not present

## 2022-07-31 DIAGNOSIS — H47323 Drusen of optic disc, bilateral: Secondary | ICD-10-CM | POA: Diagnosis not present

## 2022-07-31 DIAGNOSIS — H526 Other disorders of refraction: Secondary | ICD-10-CM | POA: Diagnosis not present

## 2022-07-31 LAB — HM DIABETES EYE EXAM

## 2022-08-18 DIAGNOSIS — R339 Retention of urine, unspecified: Secondary | ICD-10-CM | POA: Diagnosis not present

## 2022-08-18 DIAGNOSIS — N401 Enlarged prostate with lower urinary tract symptoms: Secondary | ICD-10-CM | POA: Diagnosis not present

## 2022-08-18 DIAGNOSIS — N138 Other obstructive and reflux uropathy: Secondary | ICD-10-CM | POA: Diagnosis not present

## 2022-08-18 DIAGNOSIS — Z87442 Personal history of urinary calculi: Secondary | ICD-10-CM | POA: Diagnosis not present

## 2022-08-18 DIAGNOSIS — N529 Male erectile dysfunction, unspecified: Secondary | ICD-10-CM | POA: Diagnosis not present

## 2022-08-19 ENCOUNTER — Other Ambulatory Visit (HOSPITAL_BASED_OUTPATIENT_CLINIC_OR_DEPARTMENT_OTHER): Payer: Self-pay

## 2022-08-19 ENCOUNTER — Other Ambulatory Visit: Payer: Self-pay

## 2022-08-20 ENCOUNTER — Encounter: Payer: Self-pay | Admitting: Family Medicine

## 2022-08-25 ENCOUNTER — Ambulatory Visit: Payer: BC Managed Care – PPO | Admitting: Family Medicine

## 2022-08-25 ENCOUNTER — Other Ambulatory Visit (HOSPITAL_BASED_OUTPATIENT_CLINIC_OR_DEPARTMENT_OTHER): Payer: Self-pay

## 2022-08-25 ENCOUNTER — Encounter: Payer: Self-pay | Admitting: Family Medicine

## 2022-08-25 VITALS — BP 111/71 | HR 113 | Ht 71.0 in | Wt 164.0 lb

## 2022-08-25 DIAGNOSIS — H938X3 Other specified disorders of ear, bilateral: Secondary | ICD-10-CM

## 2022-08-25 DIAGNOSIS — E1121 Type 2 diabetes mellitus with diabetic nephropathy: Secondary | ICD-10-CM | POA: Diagnosis not present

## 2022-08-25 DIAGNOSIS — E559 Vitamin D deficiency, unspecified: Secondary | ICD-10-CM

## 2022-08-25 DIAGNOSIS — H6121 Impacted cerumen, right ear: Secondary | ICD-10-CM | POA: Diagnosis not present

## 2022-08-25 DIAGNOSIS — N401 Enlarged prostate with lower urinary tract symptoms: Secondary | ICD-10-CM

## 2022-08-25 DIAGNOSIS — N138 Other obstructive and reflux uropathy: Secondary | ICD-10-CM | POA: Diagnosis not present

## 2022-08-25 DIAGNOSIS — E782 Mixed hyperlipidemia: Secondary | ICD-10-CM

## 2022-08-25 DIAGNOSIS — Z794 Long term (current) use of insulin: Secondary | ICD-10-CM

## 2022-08-25 LAB — POCT GLYCOSYLATED HEMOGLOBIN (HGB A1C): Hemoglobin A1C: 5.8 % — AB (ref 4.0–5.6)

## 2022-08-25 MED ORDER — TIRZEPATIDE 10 MG/0.5ML ~~LOC~~ SOAJ
10.0000 mg | SUBCUTANEOUS | 1 refills | Status: DC
Start: 2022-08-25 — End: 2023-04-08
  Filled 2022-08-25: qty 6, 84d supply, fill #0
  Filled 2022-11-17: qty 6, 84d supply, fill #1
  Filled 2022-12-05: qty 2, 28d supply, fill #1
  Filled 2023-02-18: qty 2, 28d supply, fill #2
  Filled 2023-03-15: qty 2, 28d supply, fill #3

## 2022-08-25 NOTE — Patient Instructions (Addendum)
Please decrease insulin to 30 units daily Once you start the 10mg  Mounjaro decrease the Tresiba to 15 units for 2 weeks and then stop it completley.   You can Debrox drops. 4 drops twice a day for 4 days on and 3 days off.

## 2022-08-25 NOTE — Assessment & Plan Note (Signed)
Follow with Urology now.

## 2022-08-25 NOTE — Assessment & Plan Note (Signed)
A1C fabulous today !!!!  Please decrease insulin to 30 units daily Once you start the 10mg  Mounjaro decrease the Tresiba to 15 units for 2 weeks and then stop it completley.   Lab Results  Component Value Date   HGBA1C 5.8 (A) 08/25/2022

## 2022-08-25 NOTE — Progress Notes (Signed)
Established Patient Office Visit  Subjective   Patient ID: Chord Mccurty, male    DOB: 11-16-1970  Age: 52 y.o. MRN: [id number]  Chief Complaint  Patient presents with   Diabetes    HPI  Diabetes - no hypoglycemic events. No wounds or sores that are not healing well. No increased thirst or urination. Checking glucose at home. Taking medications as prescribed without any side effects. On there is 7.5 mg. On 34 units of Rodolph Bong for eye exam in July.  Follows with Dr. Sherryl Barters with Urology for BPH. He is on alfuzosin now instead of Flomax.    He also reports that he said the sensation of his ears being sort of plugged up like when you come up out of the water.  No pain.  Also noticing that he is talking a little louder at times.  Taking vitamin D weekly capsule. Has been great than a year since Vit D level checked.      ROS    Objective:     BP 111/71   Pulse (!) 113   Ht 5\' 11"  (1.803 m)   Wt 164 lb (74.4 kg)   SpO2 99%   BMI 22.87 kg/m    Physical Exam Constitutional:      Appearance: He is well-developed.  HENT:     Head: Normocephalic and atraumatic.     Ears:     Comments: TM is blocked completely by cerumen unable to visualize the tympanic membrane.  I am only able to visualize the upper half of the left TM but I am able to see the ossicle and there is no erythema.  Unable to tell if there may be an effusion. Cardiovascular:     Rate and Rhythm: Normal rate and regular rhythm.     Heart sounds: Normal heart sounds.  Pulmonary:     Effort: Pulmonary effort is normal.     Breath sounds: Normal breath sounds.  Skin:    General: Skin is warm and dry.  Neurological:     Mental Status: He is alert and oriented to person, place, and time.  Psychiatric:        Behavior: Behavior normal.      Results for orders placed or performed in visit on 08/25/22  POCT glycosylated hemoglobin (Hb A1C)  Result Value Ref Range   Hemoglobin A1C 5.8 (A) 4.0 -  5.6 %   HbA1c POC (<> result, manual entry)     HbA1c, POC (prediabetic range)     HbA1c, POC (controlled diabetic range)        The ASCVD Risk score (Arnett DK, et al., 2019) failed to calculate for the following reasons:   The valid total cholesterol range is 130 to 320 mg/dL    Assessment & Plan:   Problem List Items Addressed This Visit       Endocrine   Diabetes mellitus, type 2 (HCC) - Primary    A1C fabulous today !!!!  Please decrease insulin to 30 units daily Once you start the 10mg  Mounjaro decrease the Tresiba to 15 units for 2 weeks and then stop it completley.   Lab Results  Component Value Date   HGBA1C 5.8 (A) 08/25/2022         Relevant Medications   tirzepatide (MOUNJARO) 10 MG/0.5ML Pen   Other Relevant Orders   CMP14+EGFR   Urine Microalbumin w/creat. ratio   POCT glycosylated hemoglobin (Hb A1C) (Completed)     Genitourinary  Benign prostatic hyperplasia with urinary obstruction    Follow with Urology now.       Relevant Medications   alfuzosin (UROXATRAL) 10 MG 24 hr tablet   Other Relevant Orders   CMP14+EGFR   Urine Microalbumin w/creat. ratio     Other   Hyperlipidemia    Continue Crestor. Lipid panel UTD.       Other Visit Diagnoses     Vitamin D deficiency       Relevant Orders   VITAMIN D 25 Hydroxy (Vit-D Deficiency, Fractures)   Hearing loss due to cerumen impaction, right       Ear fullness, bilateral          Ear fullness/cerumen impaction right - discussed using Debrox drops.   Return in about 3 months (around 11/25/2022).    Nani Gasser, MD

## 2022-08-25 NOTE — Assessment & Plan Note (Signed)
Continue Crestor. Lipid panel UTD.

## 2022-08-26 NOTE — Progress Notes (Signed)
Your lab work is within acceptable range and there are no concerning findings.   ?

## 2022-10-01 ENCOUNTER — Other Ambulatory Visit: Payer: Self-pay | Admitting: Family Medicine

## 2022-10-02 ENCOUNTER — Other Ambulatory Visit (HOSPITAL_BASED_OUTPATIENT_CLINIC_OR_DEPARTMENT_OTHER): Payer: Self-pay

## 2022-10-02 MED ORDER — ALFUZOSIN HCL ER 10 MG PO TB24
10.0000 mg | ORAL_TABLET | Freq: Every day | ORAL | 1 refills | Status: DC
  Filled 2022-10-02: qty 90, 90d supply, fill #0
  Filled 2022-12-05: qty 90, 90d supply, fill #1
  Filled 2022-12-29: qty 30, 30d supply, fill #1
  Filled 2023-02-18: qty 30, 30d supply, fill #2
  Filled 2023-03-15 – 2023-03-16 (×2): qty 30, 30d supply, fill #3

## 2022-10-02 NOTE — Telephone Encounter (Signed)
Requesting rx rf of alfuzosin  Last written by historical provider Last OV 08/25/2022 Next schld appt 11/26/2022

## 2022-10-05 ENCOUNTER — Other Ambulatory Visit: Payer: Self-pay | Admitting: Family Medicine

## 2022-10-05 ENCOUNTER — Other Ambulatory Visit (HOSPITAL_BASED_OUTPATIENT_CLINIC_OR_DEPARTMENT_OTHER): Payer: Self-pay

## 2022-10-05 MED ORDER — TRESIBA FLEXTOUCH 200 UNIT/ML ~~LOC~~ SOPN
34.0000 [IU] | PEN_INJECTOR | Freq: Every day | SUBCUTANEOUS | 1 refills | Status: DC
  Filled 2022-12-05: qty 9, 50d supply, fill #0
  Filled 2023-01-08: qty 6, 34d supply, fill #0

## 2022-10-19 ENCOUNTER — Other Ambulatory Visit (HOSPITAL_BASED_OUTPATIENT_CLINIC_OR_DEPARTMENT_OTHER): Payer: Self-pay

## 2022-10-19 ENCOUNTER — Other Ambulatory Visit: Payer: Self-pay

## 2022-11-02 NOTE — Progress Notes (Unsigned)
NEUROLOGY CONSULTATION NOTE  [name] MRN: [id number] DOB: 1970-01-25  Referring provider: Nani Gasser, MD Primary care provider: Nani Gasser, MD  Reason for consult:  multiple sclerosis  Assessment/Plan:   Multiple sclerosis  Check new baseline MRI of brain and cervical spine with and without contrast DMT:  Vumerity Continue D 50,000 units weekly Check CBC with diff today and repeat (along with vit D level) in 6 months. Follow up in 6 months or as needed.   Subjective:  [name] is a 52 year old right-handed male with HTN, DM 2, HLD and history of kidney stones who presents to establish care for multiple sclerosis.  History supplemented by prior neurologist's notes.  MRIs from 2019 and 2021 personally reviewed.    In 1999 he woke up one morning with right arm numbness.  He underwent LP where CSF revealed elevated oligoclonal bands and was diagnosed with multiple sclerosis.  He was started on Avonex and later Copaxone.  Right arm numbness improved but didn't completely resolve.  He saw another neurologist around 2006 who questioned the diagnosis and had him stop taking Copaxone.  In 2019, he developed tingling in his fingertips and right sided arm and leg weakness and numbness.  Repeat LP revealed elevated oligoclonal bands in both CSF and serum.  He was started on Tysabri in 2020.  He became JCV antibody positive and dosing was extended to every 6 weeks.  Due to concerns about PML, he stopped Tysabri in November 2022.  He established care with another neurologist in 2023 and was started on Vumerity.    Has had pseudo-exacerbations with numbness and gait instability with heat exposure (such as in summer)  Vision:  okay.  Keeps early check up as he has DM 2. Motor:  When standing or sitting for more than 20 minutes, he needs to wait a moment to regain strength in legs.  Infrequently, when driving, he feels his right foot "doesn't listen to his brain fast  enough"  Sensory:  right sided numbness and tingling (mostly involving fingers and foot) increased over time.   Pain:  Feels some pain in the right shoulder.  Query pinched nerve. Gait:  Generally feels stable. Bowel/Bladder:  bladder incontinence.  On Myrbetriq Fatigue:  yes Cognition:  Decreased processing.  Sometimes forgets what he wants to say.   Mood:  He has had some anxiety over the last year due to problems performing his duties at work, however that has improved since he left his job.   Always feels like he has "water in my ears"  Current DMT:  Vumerity (2023) Past DMT:  Avonex, Copaxone, Tysabri (2020-2022, discontinued due to positive JCV antibody)  Current medications:  ASA 81mg  daily, alfuzosin, insulin, lisinopril, Myrbegriq, tadalafil, Mounjaro, D 50,000 units weekly   Imaging: 09/18/2017 MRI BRAIN W WO:  1. Moderate periventricular white matter disease in this patient with history of multiple sclerosis. Small nodular focus of enhancement about the occipital horn of the left lateral ventricle is presumably a site of active demyelination. 2. 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 10/11/2017 MRI C-SPINE W WO:  1.     There are several T2 hyperintense foci within the spinal cord adjacent to C2, C3-C4, C4, C6 and C7-T1 as detailed above.  None of these appear to be acute and they do not enhance.   They are consistent with chronic demyelinating plaque associated with multiple sclerosis. 2.  There is borderline spinal stenosis at C5-C6 and mild degenerative changes at C4-C5 and C6-C7.  There is no nerve root compression at any of these levels. 3.     There is a normal enhancement pattern. 03/21/2019 MRI BRAIN W WO:  1.   T2/FLAIR hyperintense foci in the hemispheres, predominantly in the periventricular white matter, radially oriented to the ventricles, and a pattern  consistent with chronic demyelinating plaque associated with multiple sclerosis.  One focus that was enhancing on the 2019 MRI no longer does so.  There are no new lesions in the interim. 2.   Generalized cortical atrophy.  This is likely due to his MS.  However, ventricles are enlarged more than typical for the extent of atrophy and superimposed communicating hydrocephalus cannot be ruled out. 03/21/2019 MRI C-SPINE W WO: 1.    Several T2 hyperintense foci within the spinal cord in a pattern consistent with chronic demyelinating plaque associated with multiple sclerosis.  None of the foci enhances or appears to be acute. 2.   At C5-C6 there is a right paramedian disc protrusion and uncovertebral spurring causing moderate right foraminal narrowing that could impinge upon the right C6 nerve root.  Milder degenerative changes are noted at C4-C5 and C6-C7 without potential for nerve root compression. 3.    There is a normal enhancement pattern. 06/10/2021 MRI BRAIN W WO:  Mild progression of white matter hyperintensities within the brain compatible with the known demyelinating disease. A few of the new small periventricular lesions enhance compatible with active demyelination.  06/10/2021 MRI C-SPINE W WO:  1.  Several T2 hyperintense lesions within the cervical cord consistent the patient's history of demyelinating disease. Overall appearance grossly similar to comparison examination. No abnormal cervical cord enhancement. 2.  Degenerative disc disease at C5-C6 which has worsened in the interval from the 2010 comparison (no more recent comparison imaging available at the time of image interpretation). There is mild central canal stenosis and severe right foraminal stenosis at this level.  06/10/2021 MRI T-SPINE W WO:  Mild thoracic spondylosis. No demyelinating plaques identified in the thoracic spinal cord.   08/25/2022 LABS:  CMP with Na 139, K 4.6, Cl 101, CO2 24, Ca 10, glucose 101, BUN 15, Cr 0.76, t bili  0.5, ALP 55, AST 22, ALT 32; vit D 64.5  PAST MEDICAL HISTORY: Past Medical History:  Diagnosis Date   Allergy    Asthma    as a child   Diabetes mellitus without complication (HCC)    Hyperlipidemia    Hypertension    Multiple sclerosis (HCC) 1999   Dr Estella Husk   Nephrolithiasis    sees urology    PAST SURGICAL HISTORY: Past Surgical History:  Procedure Laterality Date   APPENDECTOMY  2003   LITHOTRIPSY      MEDICATIONS: Current Outpatient Medications on File Prior to Visit  Medication Sig Dispense Refill   alfuzosin (UROXATRAL) 10 MG 24 hr tablet Take 1 tablet (10 mg total) by mouth daily with breakfast. 90 tablet 1   aspirin EC 81 MG tablet Take 81 mg by mouth daily.     glucose blood (CONTOUR NEXT TEST) test strip one strip (1 each dose) by Other route daily.     insulin degludec (TRESIBA FLEXTOUCH) 200 UNIT/ML FlexTouch Pen Inject 34 Units into the skin daily. 12 mL 1   Insulin Pen Needle (TECHLITE PEN NEEDLES) 32G X 4 MM MISC Use with Evaristo Bury FlexTouch U-200 pen. One injection daily. 100 each 3  lisinopril (ZESTRIL) 5 MG tablet Take 1 tablet (5 mg total) by mouth daily. 90 tablet 3   mirabegron ER (MYRBETRIQ) 25 MG TB24 tablet Take 1 tablet (25 mg total) by mouth daily. 90 tablet 3   Multiple Vitamin (MULTIVITAMIN) tablet Take 1 tablet by mouth daily.     omeprazole (PRILOSEC) 20 MG capsule Take 1 capsule (20 mg total) by mouth daily. 90 capsule 3   rosuvastatin (CRESTOR) 40 MG tablet Take 1 tablet (40 mg total) by mouth daily. 90 tablet 3   tadalafil (CIALIS) 5 MG tablet Take 5 mg by mouth daily as needed for erectile dysfunction.     tirzepatide (MOUNJARO) 10 MG/0.5ML Pen Inject 10 mg into the skin once a week. 6 mL 1   VUMERITY 231 MG CPDR Take 231 mg by mouth daily. Pt reports he's taking  4 tablets daily     No current facility-administered medications on file prior to visit.    ALLERGIES: Allergies  Allergen Reactions   Hazelnut ([place]) Anaphylaxis,  Anxiety, Dermatitis, Itching, Rash and Shortness Of Breath    FAMILY HISTORY: Family History  Problem Relation Age of Onset   Hypertension Mother    Alcohol abuse Father    Cancer Maternal Grandfather    Diabetes Maternal Aunt    Colon cancer Neg Hx    Esophageal cancer Neg Hx    Rectal cancer Neg Hx    Stomach cancer Neg Hx     Objective:  Blood pressure 110/75, pulse 100, resp. rate 18, height 5\' 11"  (1.803 m), weight 160 lb (72.6 kg), SpO2 98%. General: No acute distress.  Patient appears well-groomed.   Head:  Normocephalic/atraumatic Eyes:  fundi examined but not visualized Neck: supple, no paraspinal tenderness, full range of motion Heart: regular rate and rhythm Neurological Exam: Mental status: alert and oriented to person, place, and time, speech fluent and not dysarthric, language intact. Cranial nerves: CN I: not tested CN II: pupils equal, round and reactive to light, visual fields intact CN III, IV, VI:  full range of motion, no nystagmus, no ptosis CN V: facial sensation intact. CN VII: upper and lower face symmetric CN VIII: hearing intact CN IX, X: gag intact, uvula midline CN XI: sternocleidomastoid and trapezius muscles intact CN XII: tongue midline Bulk & Tone: normal, no fasciculations. Motor:  muscle strength 4+/5 right EHL, otherwise 5/5 throughout Sensation:  Pinprick sensation reduced in right upper and lower extremities, vibratory sensation intact. Deep Tendon Reflexes:  3+ throughout,  toes downgoing.   Finger to nose testing:  Without dysmetria.   Gait:  Mildly wide-based gait but steady, slight right limp.  Able to tandem walk.  Romberg negative.    Thank you for allowing me to take part in the care of this patient.  Shon Millet, DO  CC: Nani Gasser, MD

## 2022-11-03 ENCOUNTER — Other Ambulatory Visit (INDEPENDENT_AMBULATORY_CARE_PROVIDER_SITE_OTHER): Payer: Self-pay

## 2022-11-03 ENCOUNTER — Ambulatory Visit: Payer: Self-pay | Admitting: Neurology

## 2022-11-03 ENCOUNTER — Encounter: Payer: Self-pay | Admitting: Neurology

## 2022-11-03 ENCOUNTER — Ambulatory Visit (INDEPENDENT_AMBULATORY_CARE_PROVIDER_SITE_OTHER): Payer: BC Managed Care – PPO | Admitting: Neurology

## 2022-11-03 VITALS — BP 110/75 | HR 100 | Resp 18 | Ht 71.0 in | Wt 160.0 lb

## 2022-11-03 DIAGNOSIS — G35 Multiple sclerosis: Secondary | ICD-10-CM | POA: Diagnosis not present

## 2022-11-03 LAB — CBC WITH DIFFERENTIAL/PLATELET
Basophils Absolute: 0 10*3/uL (ref 0.0–0.1)
Basophils Relative: 0.3 % (ref 0.0–3.0)
Eosinophils Absolute: 0.2 10*3/uL (ref 0.0–0.7)
Eosinophils Relative: 4.7 % (ref 0.0–5.0)
HCT: 40 % (ref 39.0–52.0)
Hemoglobin: 13.4 g/dL (ref 13.0–17.0)
Lymphocytes Relative: 19.8 % (ref 12.0–46.0)
Lymphs Abs: 1 10*3/uL (ref 0.7–4.0)
MCHC: 33.6 g/dL (ref 30.0–36.0)
MCV: 90 fL (ref 78.0–100.0)
Monocytes Absolute: 0.5 10*3/uL (ref 0.1–1.0)
Monocytes Relative: 9.6 % (ref 3.0–12.0)
Neutro Abs: 3.3 10*3/uL (ref 1.4–7.7)
Neutrophils Relative %: 65.6 % (ref 43.0–77.0)
Platelets: 211 10*3/uL (ref 150.0–400.0)
RBC: 4.44 Mil/uL (ref 4.22–5.81)
RDW: 13 % (ref 11.5–15.5)
WBC: 5 10*3/uL (ref 4.0–10.5)

## 2022-11-03 LAB — VITAMIN D 25 HYDROXY (VIT D DEFICIENCY, FRACTURES): VITD: 58.3 ng/mL (ref 30.00–100.00)

## 2022-11-03 NOTE — Patient Instructions (Addendum)
Continue Vumerity Check MRI of brain and cervical spine with and without contrast.  If possible, consider getting copy of your MRIs from May 2023 on disc so we can compare. Continue vit D ASSESSMENT:MRI Exams MRI Brain  Please notify your physician if you have allergies to MRI contrast.  MRI cannot be performed on patients with cardiac pacemakers, some cardiac valves and stents, ear implants, neuro-stimulators and some aneurysm clips.  Bring any information peretaining to stents or implants to your exam.  MRI Brain or Orbit: No eye makeup preferred.  MRI of Abdomen and/or MRCP: No food or drink 4 hours prior.  If you require medication for claustrophobia or anxiety, please bring your medication and a driver with you to the appointment. Upon arrival, tell the receptionist that you have brought your medication. Ask when to take it.   Address: [address], [address]  [place], [place]  Phone: [phone]  Check CBC with diff today.  Repeat CBC with diff and vit D in 6 months. Follow up in 6 months (after repeat labs).  Follow up sooner if we plan to change management based on MRI

## 2022-11-09 ENCOUNTER — Ambulatory Visit: Payer: BC Managed Care – PPO | Admitting: Neurology

## 2022-11-17 ENCOUNTER — Other Ambulatory Visit (HOSPITAL_BASED_OUTPATIENT_CLINIC_OR_DEPARTMENT_OTHER): Payer: Self-pay

## 2022-11-18 ENCOUNTER — Other Ambulatory Visit (HOSPITAL_BASED_OUTPATIENT_CLINIC_OR_DEPARTMENT_OTHER): Payer: Self-pay

## 2022-11-18 DIAGNOSIS — N401 Enlarged prostate with lower urinary tract symptoms: Secondary | ICD-10-CM | POA: Diagnosis not present

## 2022-11-18 DIAGNOSIS — R339 Retention of urine, unspecified: Secondary | ICD-10-CM | POA: Diagnosis not present

## 2022-11-18 DIAGNOSIS — N529 Male erectile dysfunction, unspecified: Secondary | ICD-10-CM | POA: Diagnosis not present

## 2022-11-18 DIAGNOSIS — N138 Other obstructive and reflux uropathy: Secondary | ICD-10-CM | POA: Diagnosis not present

## 2022-11-18 DIAGNOSIS — Z87442 Personal history of urinary calculi: Secondary | ICD-10-CM | POA: Diagnosis not present

## 2022-11-19 ENCOUNTER — Other Ambulatory Visit (HOSPITAL_BASED_OUTPATIENT_CLINIC_OR_DEPARTMENT_OTHER): Payer: Self-pay

## 2022-11-23 ENCOUNTER — Other Ambulatory Visit (HOSPITAL_BASED_OUTPATIENT_CLINIC_OR_DEPARTMENT_OTHER): Payer: Self-pay

## 2022-11-23 ENCOUNTER — Other Ambulatory Visit: Payer: Self-pay

## 2022-11-23 MED ORDER — TECHLITE PLUS PEN NEEDLES 32G X 4 MM MISC
3 refills | Status: DC
  Filled 2022-11-23: qty 100, 90d supply, fill #0
  Filled 2023-02-23: qty 100, 90d supply, fill #1

## 2022-11-24 ENCOUNTER — Other Ambulatory Visit (HOSPITAL_BASED_OUTPATIENT_CLINIC_OR_DEPARTMENT_OTHER): Payer: Self-pay

## 2022-11-26 ENCOUNTER — Ambulatory Visit (INDEPENDENT_AMBULATORY_CARE_PROVIDER_SITE_OTHER): Payer: Self-pay | Admitting: Family Medicine

## 2022-11-26 ENCOUNTER — Other Ambulatory Visit: Payer: Self-pay

## 2022-11-26 ENCOUNTER — Encounter: Payer: Self-pay | Admitting: Family Medicine

## 2022-11-26 VITALS — BP 101/64 | HR 107 | Ht 71.0 in | Wt 164.0 lb

## 2022-11-26 DIAGNOSIS — G35D Multiple sclerosis, unspecified: Secondary | ICD-10-CM

## 2022-11-26 DIAGNOSIS — E1121 Type 2 diabetes mellitus with diabetic nephropathy: Secondary | ICD-10-CM | POA: Diagnosis not present

## 2022-11-26 DIAGNOSIS — Z23 Encounter for immunization: Secondary | ICD-10-CM

## 2022-11-26 DIAGNOSIS — R809 Proteinuria, unspecified: Secondary | ICD-10-CM

## 2022-11-26 DIAGNOSIS — Z7985 Long-term (current) use of injectable non-insulin antidiabetic drugs: Secondary | ICD-10-CM

## 2022-11-26 DIAGNOSIS — G35 Multiple sclerosis: Secondary | ICD-10-CM | POA: Diagnosis not present

## 2022-11-26 DIAGNOSIS — I959 Hypotension, unspecified: Secondary | ICD-10-CM

## 2022-11-26 DIAGNOSIS — E782 Mixed hyperlipidemia: Secondary | ICD-10-CM | POA: Diagnosis not present

## 2022-11-26 DIAGNOSIS — E1129 Type 2 diabetes mellitus with other diabetic kidney complication: Secondary | ICD-10-CM | POA: Diagnosis not present

## 2022-11-26 LAB — POCT GLYCOSYLATED HEMOGLOBIN (HGB A1C): Hemoglobin A1C: 6.5 % — AB (ref 4.0–5.6)

## 2022-11-26 NOTE — Assessment & Plan Note (Signed)
Technically due for lipids next month.  But we will try to get those drawn at next office visit.

## 2022-11-26 NOTE — Progress Notes (Signed)
Established Patient Office Visit  Subjective   Patient ID: [name], male    DOB: 01-21-1970  Age: 52 y.o. MRN: [id number]  Chief Complaint  Patient presents with   Diabetes    HPI  Diabetes - no hypoglycemic events. No wounds or sores that are not healing well. No increased thirst or urination. Checking glucose at home. Taking medications as prescribed without any side effects. On Mounjaro 10mg  wkly.   Disease off the Myrbetriq for his urinary symptoms.  He really did not notice much improvement so they just discontinued it about 2 weeks ago.     ROS    Objective:     BP 101/64   Pulse (!) 107   Ht 5\' 11"  (1.803 m)   Wt 164 lb (74.4 kg)   SpO2 99%   BMI 22.87 kg/m    Physical Exam Vitals and nursing note reviewed.  Constitutional:      Appearance: Normal appearance.  HENT:     Head: Normocephalic and atraumatic.  Eyes:     Conjunctiva/sclera: Conjunctivae normal.  Cardiovascular:     Rate and Rhythm: Regular rhythm. Tachycardia present.  Pulmonary:     Effort: Pulmonary effort is normal.     Breath sounds: Normal breath sounds.  Skin:    General: Skin is warm and dry.  Neurological:     Mental Status: He is alert.  Psychiatric:        Mood and Affect: Mood normal.      Results for orders placed or performed in visit on 11/26/22  POCT HgB A1C  Result Value Ref Range   Hemoglobin A1C 6.5 (A) 4.0 - 5.6 %   HbA1c POC (<> result, manual entry)     HbA1c, POC (prediabetic range)     HbA1c, POC (controlled diabetic range)        The ASCVD Risk score (Arnett DK, et al., 2019) failed to calculate for the following reasons:   The valid total cholesterol range is 130 to 320 mg/dL    Assessment & Plan:   Problem List Items Addressed This Visit       Cardiovascular and Mediastinum   Hypotension    Pressure is a little low.  We did discuss that for some people that are normal is a little lower he is taking 5 mg of lisinopril for renal  protection.  We discussed pros and cons of continuing the medication or just continuing to monitor.  He is very infrequently getting a little bit of lightheadedness.  So I do not think it is concerning at this time we will continue with the lisinopril for now but we could discontinue it if needed.  Encouraged her to make sure that he is hydrating well during the day.        Endocrine   Microalbuminuria due to type 2 diabetes mellitus (HCC)    Currently on 5 mg of lisinopril.  Blood pressures definitely on the low end today so we did discuss the pros and cons of keeping the medication.  He has not felt really lightheaded or dizzy often maybe once in a while if he bends over and then stands back up quickly but nothing out of the norm.      Diabetes mellitus, type 2 (HCC) - Primary    A1c looks great today at 6.5 though it is up a little bit from previous.  Continue current regimen doing well with just 30 units of Tresiba and the Mounjaro 10 mg.  Plan to follow-up in 3 to 4 months.      Relevant Orders   POCT HgB A1C (Completed)     Nervous and Auditory   Multiple sclerosis (HCC)    For MRI of spine and brain next month for further workup for his MS.  Follows with Dr. Shon Millet.        Other   Hyperlipidemia    Technically due for lipids next month.  But we will try to get those drawn at next office visit.      Other Visit Diagnoses     Encounter for immunization       Relevant Orders   Flu vaccine trivalent PF, 6mos and older(Flulaval,Afluria,Fluarix,Fluzone) (Completed)       Return in about 4 months (around 03/26/2023) for Diabetes follow-up.    Nani Gasser, MD

## 2022-11-26 NOTE — Assessment & Plan Note (Addendum)
For MRI of spine and brain next month for further workup for his MS.  Follows with Dr. Shon Millet.

## 2022-11-26 NOTE — Assessment & Plan Note (Signed)
Currently on 5 mg of lisinopril.  Blood pressures definitely on the low end today so we did discuss the pros and cons of keeping the medication.  He has not felt really lightheaded or dizzy often maybe once in a while if he bends over and then stands back up quickly but nothing out of the norm.

## 2022-11-26 NOTE — Assessment & Plan Note (Signed)
Pressure is a little low.  We did discuss that for some people that are normal is a little lower he is taking 5 mg of lisinopril for renal protection.  We discussed pros and cons of continuing the medication or just continuing to monitor.  He is very infrequently getting a little bit of lightheadedness.  So I do not think it is concerning at this time we will continue with the lisinopril for now but we could discontinue it if needed.  Encouraged her to make sure that he is hydrating well during the day.

## 2022-11-26 NOTE — Assessment & Plan Note (Signed)
A1c looks great today at 6.5 though it is up a little bit from previous.  Continue current regimen doing well with just 30 units of Tresiba and the Mounjaro 10 mg.  Plan to follow-up in 3 to 4 months.

## 2022-12-01 ENCOUNTER — Encounter: Payer: Self-pay | Admitting: Family Medicine

## 2022-12-02 ENCOUNTER — Other Ambulatory Visit: Payer: Self-pay | Admitting: Neurology

## 2022-12-02 ENCOUNTER — Telehealth: Payer: Self-pay | Admitting: Neurology

## 2022-12-02 ENCOUNTER — Other Ambulatory Visit (HOSPITAL_COMMUNITY): Payer: Self-pay

## 2022-12-02 ENCOUNTER — Other Ambulatory Visit (HOSPITAL_BASED_OUTPATIENT_CLINIC_OR_DEPARTMENT_OTHER): Payer: Self-pay

## 2022-12-02 MED ORDER — VUMERITY 231 MG PO CPDR
462.0000 mg | DELAYED_RELEASE_CAPSULE | Freq: Two times a day (BID) | ORAL | 5 refills | Status: DC
  Filled 2022-12-02: qty 120, fill #0

## 2022-12-02 MED ORDER — LORAZEPAM 1 MG PO TABS
1.0000 mg | ORAL_TABLET | Freq: Once | ORAL | 0 refills | Status: AC
  Filled 2022-12-02: qty 2, 2d supply, fill #0

## 2022-12-02 MED ORDER — VUMERITY 231 MG PO CPDR: 231.0000 mg | DELAYED_RELEASE_CAPSULE | Freq: Every day | ORAL | 11 refills | Status: DC

## 2022-12-02 NOTE — Telephone Encounter (Signed)
Left message with the after hour service on 12-02-22 at 12:21 pm  Caller states that she is trying to get a new RX for patient   Opt 1 when calling back medication is Vumerity 231mg  14 day supply

## 2022-12-03 ENCOUNTER — Other Ambulatory Visit: Payer: Self-pay

## 2022-12-03 ENCOUNTER — Other Ambulatory Visit (HOSPITAL_COMMUNITY): Payer: Self-pay

## 2022-12-03 ENCOUNTER — Other Ambulatory Visit (HOSPITAL_BASED_OUTPATIENT_CLINIC_OR_DEPARTMENT_OTHER): Payer: Self-pay

## 2022-12-05 ENCOUNTER — Other Ambulatory Visit (HOSPITAL_BASED_OUTPATIENT_CLINIC_OR_DEPARTMENT_OTHER): Payer: Self-pay

## 2022-12-05 ENCOUNTER — Other Ambulatory Visit: Payer: Self-pay | Admitting: Family Medicine

## 2022-12-05 DIAGNOSIS — E1121 Type 2 diabetes mellitus with diabetic nephropathy: Secondary | ICD-10-CM

## 2022-12-06 DIAGNOSIS — G35 Multiple sclerosis: Secondary | ICD-10-CM | POA: Diagnosis not present

## 2022-12-06 DIAGNOSIS — M47812 Spondylosis without myelopathy or radiculopathy, cervical region: Secondary | ICD-10-CM | POA: Diagnosis not present

## 2022-12-06 DIAGNOSIS — M4802 Spinal stenosis, cervical region: Secondary | ICD-10-CM | POA: Diagnosis not present

## 2022-12-07 ENCOUNTER — Other Ambulatory Visit (HOSPITAL_BASED_OUTPATIENT_CLINIC_OR_DEPARTMENT_OTHER): Payer: Self-pay

## 2022-12-07 MED ORDER — LISINOPRIL 5 MG PO TABS
5.0000 mg | ORAL_TABLET | Freq: Every day | ORAL | 3 refills | Status: DC
  Filled 2022-12-07: qty 90, 90d supply, fill #0
  Filled 2023-01-18: qty 30, 30d supply, fill #0
  Filled 2023-02-15: qty 30, 30d supply, fill #1
  Filled 2023-03-15: qty 30, 30d supply, fill #2
  Filled 2023-04-14: qty 30, 30d supply, fill #3
  Filled 2023-05-19: qty 30, 30d supply, fill #4
  Filled 2023-06-18 – 2023-06-22 (×2): qty 30, 30d supply, fill #5
  Filled 2023-07-26: qty 30, 30d supply, fill #6
  Filled 2023-08-23: qty 30, 30d supply, fill #7
  Filled 2023-09-22: qty 30, 30d supply, fill #8
  Filled 2023-10-18: qty 30, 30d supply, fill #9

## 2022-12-08 ENCOUNTER — Other Ambulatory Visit (HOSPITAL_BASED_OUTPATIENT_CLINIC_OR_DEPARTMENT_OTHER): Payer: Self-pay

## 2022-12-08 ENCOUNTER — Telehealth: Payer: Self-pay | Admitting: Neurology

## 2022-12-08 NOTE — Telephone Encounter (Signed)
Patient advised.

## 2022-12-08 NOTE — Telephone Encounter (Signed)
MRIs do not show any evidence of active MS.  We will use these MRIs as baseline going forward.

## 2022-12-09 ENCOUNTER — Other Ambulatory Visit (HOSPITAL_BASED_OUTPATIENT_CLINIC_OR_DEPARTMENT_OTHER): Payer: Self-pay

## 2022-12-14 ENCOUNTER — Other Ambulatory Visit (HOSPITAL_BASED_OUTPATIENT_CLINIC_OR_DEPARTMENT_OTHER): Payer: Self-pay

## 2022-12-15 ENCOUNTER — Other Ambulatory Visit (HOSPITAL_BASED_OUTPATIENT_CLINIC_OR_DEPARTMENT_OTHER): Payer: Self-pay

## 2022-12-18 ENCOUNTER — Other Ambulatory Visit (HOSPITAL_BASED_OUTPATIENT_CLINIC_OR_DEPARTMENT_OTHER): Payer: Self-pay

## 2022-12-21 ENCOUNTER — Other Ambulatory Visit (HOSPITAL_BASED_OUTPATIENT_CLINIC_OR_DEPARTMENT_OTHER): Payer: Self-pay

## 2022-12-22 ENCOUNTER — Other Ambulatory Visit (HOSPITAL_BASED_OUTPATIENT_CLINIC_OR_DEPARTMENT_OTHER): Payer: Self-pay

## 2022-12-24 ENCOUNTER — Other Ambulatory Visit (HOSPITAL_BASED_OUTPATIENT_CLINIC_OR_DEPARTMENT_OTHER): Payer: Self-pay

## 2022-12-24 MED ORDER — VUMERITY 231 MG PO CPDR: 462.0000 mg | DELAYED_RELEASE_CAPSULE | Freq: Two times a day (BID) | ORAL | 5 refills | Status: DC

## 2022-12-24 NOTE — Addendum Note (Signed)
Addended by: Leida Lauth on: 12/24/2022 02:20 PM   Modules accepted: Orders

## 2022-12-28 ENCOUNTER — Other Ambulatory Visit (HOSPITAL_BASED_OUTPATIENT_CLINIC_OR_DEPARTMENT_OTHER): Payer: Self-pay

## 2022-12-29 ENCOUNTER — Encounter: Payer: Self-pay | Admitting: Family Medicine

## 2022-12-29 ENCOUNTER — Other Ambulatory Visit (HOSPITAL_BASED_OUTPATIENT_CLINIC_OR_DEPARTMENT_OTHER): Payer: Self-pay

## 2022-12-29 ENCOUNTER — Other Ambulatory Visit: Payer: Self-pay

## 2022-12-29 ENCOUNTER — Other Ambulatory Visit: Payer: Self-pay | Admitting: Family Medicine

## 2022-12-29 DIAGNOSIS — N138 Other obstructive and reflux uropathy: Secondary | ICD-10-CM

## 2022-12-29 MED ORDER — ROSUVASTATIN CALCIUM 40 MG PO TABS
40.0000 mg | ORAL_TABLET | Freq: Every day | ORAL | 3 refills | Status: DC
  Filled 2022-12-29: qty 30, 30d supply, fill #0
  Filled 2023-01-27: qty 30, 30d supply, fill #1
  Filled 2023-02-26: qty 30, 30d supply, fill #2
  Filled 2023-04-06: qty 30, 30d supply, fill #3
  Filled 2023-05-17: qty 30, 30d supply, fill #4
  Filled 2023-06-29: qty 30, 30d supply, fill #5
  Filled 2023-07-27: qty 30, 30d supply, fill #6

## 2022-12-30 ENCOUNTER — Other Ambulatory Visit (HOSPITAL_BASED_OUTPATIENT_CLINIC_OR_DEPARTMENT_OTHER): Payer: Self-pay

## 2022-12-31 ENCOUNTER — Other Ambulatory Visit (HOSPITAL_BASED_OUTPATIENT_CLINIC_OR_DEPARTMENT_OTHER): Payer: Self-pay

## 2023-01-01 ENCOUNTER — Other Ambulatory Visit (HOSPITAL_BASED_OUTPATIENT_CLINIC_OR_DEPARTMENT_OTHER): Payer: Self-pay

## 2023-01-04 ENCOUNTER — Other Ambulatory Visit (HOSPITAL_BASED_OUTPATIENT_CLINIC_OR_DEPARTMENT_OTHER): Payer: Self-pay

## 2023-01-05 ENCOUNTER — Other Ambulatory Visit (HOSPITAL_BASED_OUTPATIENT_CLINIC_OR_DEPARTMENT_OTHER): Payer: Self-pay

## 2023-01-07 ENCOUNTER — Other Ambulatory Visit (HOSPITAL_BASED_OUTPATIENT_CLINIC_OR_DEPARTMENT_OTHER): Payer: Self-pay

## 2023-01-08 ENCOUNTER — Encounter: Payer: Self-pay | Admitting: Family Medicine

## 2023-01-08 ENCOUNTER — Other Ambulatory Visit (HOSPITAL_BASED_OUTPATIENT_CLINIC_OR_DEPARTMENT_OTHER): Payer: Self-pay

## 2023-01-11 ENCOUNTER — Other Ambulatory Visit (HOSPITAL_BASED_OUTPATIENT_CLINIC_OR_DEPARTMENT_OTHER): Payer: Self-pay

## 2023-01-12 ENCOUNTER — Other Ambulatory Visit (HOSPITAL_BASED_OUTPATIENT_CLINIC_OR_DEPARTMENT_OTHER): Payer: Self-pay

## 2023-01-14 ENCOUNTER — Other Ambulatory Visit (HOSPITAL_BASED_OUTPATIENT_CLINIC_OR_DEPARTMENT_OTHER): Payer: Self-pay

## 2023-01-14 ENCOUNTER — Telehealth: Payer: Self-pay

## 2023-01-14 NOTE — Telephone Encounter (Signed)
Initiated Prior authorization ZOX:[id number] 10MG /0.5ML auto-injectors Via: Covermymeds Case/Key:[id number] Status: Pending as of Reason: Notified Pt via: Mychart

## 2023-01-15 ENCOUNTER — Other Ambulatory Visit (HOSPITAL_BASED_OUTPATIENT_CLINIC_OR_DEPARTMENT_OTHER): Payer: Self-pay

## 2023-01-18 ENCOUNTER — Other Ambulatory Visit: Payer: Self-pay

## 2023-01-18 ENCOUNTER — Other Ambulatory Visit (HOSPITAL_BASED_OUTPATIENT_CLINIC_OR_DEPARTMENT_OTHER): Payer: Self-pay

## 2023-01-18 ENCOUNTER — Other Ambulatory Visit: Payer: Self-pay | Admitting: Family Medicine

## 2023-01-19 ENCOUNTER — Other Ambulatory Visit (HOSPITAL_BASED_OUTPATIENT_CLINIC_OR_DEPARTMENT_OTHER): Payer: Self-pay

## 2023-01-19 ENCOUNTER — Other Ambulatory Visit: Payer: Self-pay

## 2023-01-22 ENCOUNTER — Other Ambulatory Visit (HOSPITAL_BASED_OUTPATIENT_CLINIC_OR_DEPARTMENT_OTHER): Payer: Self-pay

## 2023-01-22 MED ORDER — OMEPRAZOLE 20 MG PO CPDR
20.0000 mg | DELAYED_RELEASE_CAPSULE | Freq: Every day | ORAL | 3 refills | Status: DC
  Filled 2023-01-22: qty 90, 90d supply, fill #0
  Filled 2023-04-21: qty 90, 90d supply, fill #1
  Filled 2023-07-20: qty 30, 30d supply, fill #2
  Filled 2023-08-19: qty 30, 30d supply, fill #3
  Filled 2023-09-20: qty 30, 30d supply, fill #4
  Filled 2023-10-18 – 2023-10-29 (×2): qty 30, 30d supply, fill #5

## 2023-02-09 ENCOUNTER — Other Ambulatory Visit: Payer: Self-pay

## 2023-02-09 MED ORDER — VUMERITY 231 MG PO CPDR: 462.0000 mg | DELAYED_RELEASE_CAPSULE | Freq: Two times a day (BID) | ORAL | 2 refills | Status: DC

## 2023-02-09 NOTE — Telephone Encounter (Signed)
Due to the medication needing to got a mail in pharmacy.   90 day supply needed.  New Qt sent for 90 day supply.

## 2023-02-10 ENCOUNTER — Other Ambulatory Visit (HOSPITAL_BASED_OUTPATIENT_CLINIC_OR_DEPARTMENT_OTHER): Payer: Self-pay

## 2023-02-10 NOTE — Telephone Encounter (Signed)
Picked up on Jan 21, 2023.

## 2023-02-11 ENCOUNTER — Other Ambulatory Visit: Payer: Self-pay | Admitting: Family Medicine

## 2023-02-11 ENCOUNTER — Other Ambulatory Visit (HOSPITAL_BASED_OUTPATIENT_CLINIC_OR_DEPARTMENT_OTHER): Payer: Self-pay

## 2023-02-11 DIAGNOSIS — N138 Other obstructive and reflux uropathy: Secondary | ICD-10-CM

## 2023-02-12 ENCOUNTER — Other Ambulatory Visit (HOSPITAL_BASED_OUTPATIENT_CLINIC_OR_DEPARTMENT_OTHER): Payer: Self-pay

## 2023-03-03 ENCOUNTER — Other Ambulatory Visit: Payer: Self-pay

## 2023-03-03 ENCOUNTER — Other Ambulatory Visit (HOSPITAL_BASED_OUTPATIENT_CLINIC_OR_DEPARTMENT_OTHER): Payer: Self-pay

## 2023-03-03 ENCOUNTER — Other Ambulatory Visit: Payer: Self-pay | Admitting: Family Medicine

## 2023-03-03 DIAGNOSIS — N138 Other obstructive and reflux uropathy: Secondary | ICD-10-CM

## 2023-03-03 MED ORDER — TAMSULOSIN HCL 0.4 MG PO CAPS
0.4000 mg | ORAL_CAPSULE | Freq: Every day | ORAL | 1 refills | Status: DC
Start: 2023-03-03 — End: 2023-09-09
  Filled 2023-03-03: qty 30, 30d supply, fill #0
  Filled 2023-04-06: qty 30, 30d supply, fill #1
  Filled 2023-05-17: qty 30, 30d supply, fill #2
  Filled 2023-06-29: qty 30, 30d supply, fill #3
  Filled 2023-07-27: qty 30, 30d supply, fill #4

## 2023-03-04 ENCOUNTER — Other Ambulatory Visit (HOSPITAL_BASED_OUTPATIENT_CLINIC_OR_DEPARTMENT_OTHER): Payer: Self-pay

## 2023-03-15 ENCOUNTER — Other Ambulatory Visit (HOSPITAL_BASED_OUTPATIENT_CLINIC_OR_DEPARTMENT_OTHER): Payer: Self-pay

## 2023-03-26 ENCOUNTER — Ambulatory Visit: Payer: Self-pay | Admitting: Family Medicine

## 2023-04-08 ENCOUNTER — Other Ambulatory Visit: Payer: Self-pay | Admitting: Family Medicine

## 2023-04-08 ENCOUNTER — Other Ambulatory Visit (HOSPITAL_BASED_OUTPATIENT_CLINIC_OR_DEPARTMENT_OTHER): Payer: Self-pay

## 2023-04-08 ENCOUNTER — Ambulatory Visit (INDEPENDENT_AMBULATORY_CARE_PROVIDER_SITE_OTHER): Payer: Self-pay | Admitting: Family Medicine

## 2023-04-08 VITALS — BP 103/59 | HR 105 | Ht 71.0 in | Wt 161.0 lb

## 2023-04-08 DIAGNOSIS — Z7985 Long-term (current) use of injectable non-insulin antidiabetic drugs: Secondary | ICD-10-CM

## 2023-04-08 DIAGNOSIS — G35 Multiple sclerosis: Secondary | ICD-10-CM

## 2023-04-08 DIAGNOSIS — E1121 Type 2 diabetes mellitus with diabetic nephropathy: Secondary | ICD-10-CM

## 2023-04-08 DIAGNOSIS — E559 Vitamin D deficiency, unspecified: Secondary | ICD-10-CM | POA: Diagnosis not present

## 2023-04-08 DIAGNOSIS — R809 Proteinuria, unspecified: Secondary | ICD-10-CM | POA: Diagnosis not present

## 2023-04-08 DIAGNOSIS — E1129 Type 2 diabetes mellitus with other diabetic kidney complication: Secondary | ICD-10-CM

## 2023-04-08 DIAGNOSIS — Z125 Encounter for screening for malignant neoplasm of prostate: Secondary | ICD-10-CM

## 2023-04-08 DIAGNOSIS — E782 Mixed hyperlipidemia: Secondary | ICD-10-CM

## 2023-04-08 DIAGNOSIS — N401 Enlarged prostate with lower urinary tract symptoms: Secondary | ICD-10-CM

## 2023-04-08 DIAGNOSIS — N138 Other obstructive and reflux uropathy: Secondary | ICD-10-CM

## 2023-04-08 LAB — POCT GLYCOSYLATED HEMOGLOBIN (HGB A1C): Hemoglobin A1C: 6.5 % — AB (ref 4.0–5.6)

## 2023-04-08 MED ORDER — TIRZEPATIDE 12.5 MG/0.5ML ~~LOC~~ SOAJ
12.5000 mg | SUBCUTANEOUS | 1 refills | Status: DC
  Filled 2023-04-08: qty 2, 28d supply, fill #0
  Filled 2023-05-03 – 2023-05-18 (×2): qty 2, 28d supply, fill #1
  Filled 2023-06-10 – 2023-07-01 (×3): qty 2, 28d supply, fill #2
  Filled 2023-07-22 – 2023-07-26 (×2): qty 2, 28d supply, fill #3

## 2023-04-08 NOTE — Assessment & Plan Note (Signed)
 Check PSA today.

## 2023-04-08 NOTE — Assessment & Plan Note (Signed)
 Continue low-dose lisinopril for renal protection.  Blood pressures tend to run on the lower end so monitor carefully for hypotension.

## 2023-04-08 NOTE — Progress Notes (Signed)
 Established Patient Office Visit  Subjective  Patient ID: [name], male    DOB: [birth year]  Age: 53 y.o. MRN: [id number]  Chief Complaint  Patient presents with   Diabetes    HPI  [facility] and vitamin D done by Dr. Shon Millet in the fall.  He is here today for follow-up for diabetes  Lab Results  Component Value Date   HGBA1C 6.5 (A) 04/08/2023   Knowing well with his Mounjaro.  He is also using 32 units of [place].  He knows he needs to be a little bit more active than he is currently he is still working on improving his diet but is doing well overall.  He has not had any hypoglycemic events.    ROS    Objective:     BP (!) 103/59   Pulse (!) 105   Ht 5\' 11"  (1.803 m)   Wt 161 lb (73 kg)   SpO2 99%   BMI 22.45 kg/m    Physical Exam Vitals and nursing note reviewed.  Constitutional:      Appearance: Normal appearance.  HENT:     Head: Normocephalic and atraumatic.  Eyes:     Conjunctiva/sclera: Conjunctivae normal.  Cardiovascular:     Rate and Rhythm: Normal rate and regular rhythm.  Pulmonary:     Effort: Pulmonary effort is normal.     Breath sounds: Normal breath sounds.  Skin:    General: Skin is warm and dry.  Neurological:     Mental Status: He is alert.  Psychiatric:        Mood and Affect: Mood normal.     Results for orders placed or performed in visit on 04/08/23  POCT HgB A1C  Result Value Ref Range   Hemoglobin A1C 6.5 (A) 4.0 - 5.6 %   HbA1c POC (<> result, manual entry)     HbA1c, POC (prediabetic range)     HbA1c, POC (controlled diabetic range)        The ASCVD Risk score (Arnett DK, et al., 2019) failed to calculate for the following reasons:   The valid total cholesterol range is 130 to 320 mg/dL    Assessment & Plan:   Problem List Items Addressed This Visit       Endocrine   Microalbuminuria due to type 2 diabetes mellitus (HCC)   Continue low-dose lisinopril for renal protection.  Blood pressures tend to run  on the lower end so monitor carefully for hypotension.      Relevant Medications   tirzepatide (MOUNJARO) 12.5 MG/0.5ML Pen   Other Relevant Orders   CBC   Comprehensive metabolic panel   Lipid panel   PSA   POCT HgB A1C (Completed)   Vitamin D (25 hydroxy)   Diabetes mellitus, type 2 (HCC) - Primary   We did discuss options including going up on the [facility] to see if we would be able to decrease his insulin needs.  He right now he is using 32 units most days.  We did discuss that if he starts seeing blood sugars under 100 more consistently he can always on his own decrease down to 30 units and then also send me his numbers in am happy to make a larger change if needed I think it would be worthwhile to see if would be able to get him completely off of the insulin.  But I did warn about nausea and more decrease in appetite.  I want a keep a close eye on  his weight I want to make sure he does not lose any additional weight he is already down to 161 pounds he says he does eat regularly and does not skip meals.  He has been trying to just eat better food choices.  He notices that if he eats late his sugars will be higher in the morning      Relevant Medications   tirzepatide (MOUNJARO) 12.5 MG/0.5ML Pen   Other Relevant Orders   CBC   Comprehensive metabolic panel   Lipid panel   PSA   POCT HgB A1C (Completed)   Vitamin D (25 hydroxy)     Nervous and Auditory   Multiple sclerosis (HCC)   With the Vumerity he has had less numbness and tingling but feels like his reaction time is a little slow still.  Has follow-up appoint with neurology next month.        Genitourinary   Benign prostatic hyperplasia with urinary obstruction   Check PSA today.        Other   Vitamin D deficiency   Levels today.  He was previously on prescription supplementation but has been off for several months now.      Relevant Orders   CBC   Comprehensive metabolic panel   Lipid panel   PSA   POCT HgB  A1C (Completed)   Vitamin D (25 hydroxy)   Hyperlipidemia   To recheck lipid panel today.  He is currently on Crestor 40 mg.      Other Visit Diagnoses       Screening for malignant neoplasm of prostate       Relevant Orders   CBC   Comprehensive metabolic panel   Lipid panel   PSA   POCT HgB A1C (Completed)   Vitamin D (25 hydroxy)       Return in about 4 months (around 08/08/2023) for Diabetes follow-up, Hypertension.    Nani Gasser, MD

## 2023-04-08 NOTE — Assessment & Plan Note (Signed)
 We did discuss options including going up on the [facility] to see if we would be able to decrease his insulin needs.  He right now he is using 32 units most days.  We did discuss that if he starts seeing blood sugars under 100 more consistently he can always on his own decrease down to 30 units and then also send me his numbers in am happy to make a larger change if needed I think it would be worthwhile to see if would be able to get him completely off of the insulin.  But I did warn about nausea and more decrease in appetite.  I want a keep a close eye on his weight I want to make sure he does not lose any additional weight he is already down to 161 pounds he says he does eat regularly and does not skip meals.  He has been trying to just eat better food choices.  He notices that if he eats late his sugars will be higher in the morning

## 2023-04-08 NOTE — Assessment & Plan Note (Addendum)
 With the Vumerity he has had less numbness and tingling but feels like his reaction time is a little slow still.  Has follow-up appoint with neurology next month.

## 2023-04-08 NOTE — Assessment & Plan Note (Signed)
 Levels today.  He was previously on prescription supplementation but has been off for several months now.

## 2023-04-08 NOTE — Assessment & Plan Note (Signed)
 To recheck lipid panel today.  He is currently on Crestor 40 mg.

## 2023-04-09 ENCOUNTER — Encounter: Payer: Self-pay | Admitting: Family Medicine

## 2023-04-09 LAB — CBC
Hematocrit: 40.9 % (ref 37.5–51.0)
Hemoglobin: 14 g/dL (ref 13.0–17.7)
MCH: 30.8 pg (ref 26.6–33.0)
MCHC: 34.2 g/dL (ref 31.5–35.7)
MCV: 90 fL (ref 79–97)
Platelets: 222 10*3/uL (ref 150–450)
RBC: 4.55 x10E6/uL (ref 4.14–5.80)
RDW: 13.1 % (ref 11.6–15.4)
WBC: 5.9 10*3/uL (ref 3.4–10.8)

## 2023-04-09 LAB — PSA: Prostate Specific Ag, Serum: 0.8 ng/mL (ref 0.0–4.0)

## 2023-04-09 LAB — VITAMIN D 25 HYDROXY (VIT D DEFICIENCY, FRACTURES): Vit D, 25-Hydroxy: 36.2 ng/mL (ref 30.0–100.0)

## 2023-04-09 NOTE — Progress Notes (Signed)
 Hi [name], blood count looks great.  Prostate test is normal.  Vitamin D has dropped during the winter months down to 36.  Would like it above 40 just make sure you are taking at least 25 mcg daily especially through the fall winter and early spring months.

## 2023-04-21 ENCOUNTER — Other Ambulatory Visit: Payer: Self-pay | Admitting: Family Medicine

## 2023-04-21 ENCOUNTER — Other Ambulatory Visit (HOSPITAL_BASED_OUTPATIENT_CLINIC_OR_DEPARTMENT_OTHER): Payer: Self-pay

## 2023-04-22 ENCOUNTER — Other Ambulatory Visit (HOSPITAL_BASED_OUTPATIENT_CLINIC_OR_DEPARTMENT_OTHER): Payer: Self-pay

## 2023-04-27 ENCOUNTER — Other Ambulatory Visit (HOSPITAL_BASED_OUTPATIENT_CLINIC_OR_DEPARTMENT_OTHER): Payer: Self-pay

## 2023-04-27 ENCOUNTER — Other Ambulatory Visit: Payer: Self-pay | Admitting: Family Medicine

## 2023-04-30 ENCOUNTER — Other Ambulatory Visit (HOSPITAL_BASED_OUTPATIENT_CLINIC_OR_DEPARTMENT_OTHER): Payer: Self-pay

## 2023-04-30 ENCOUNTER — Other Ambulatory Visit: Payer: Self-pay | Admitting: Family Medicine

## 2023-05-03 ENCOUNTER — Other Ambulatory Visit (HOSPITAL_BASED_OUTPATIENT_CLINIC_OR_DEPARTMENT_OTHER): Payer: Self-pay

## 2023-05-04 ENCOUNTER — Ambulatory Visit: Payer: Self-pay | Admitting: Neurology

## 2023-05-05 ENCOUNTER — Other Ambulatory Visit (HOSPITAL_BASED_OUTPATIENT_CLINIC_OR_DEPARTMENT_OTHER): Payer: Self-pay

## 2023-05-12 NOTE — Progress Notes (Unsigned)
 NEUROLOGY FOLLOW UP OFFICE NOTE  [name] [id number]  Assessment/Plan:   Multiple sclerosis  DMT:  Vumerity  Vit D 50,000 units weekly *** Check CBC with diff today.  Check CBC with diff, LFTs and vit D in 6 months. Repeat MRI of brain and C-spine with and without contrast in 6 months. Follow up in 6 months (after repeat tests performed)   Subjective:  [name] is a 53 year old right-handed male with HTN, DM 2, HLD and history of kidney stones who follows up for multiple sclerosis.  UPDATE: Current DMT:  Vumerity  (2023) Current medications:  ASA 81mg  daily, alfuzosin , insulin , lisinopril , Myrbegriq, tadalafil, Mounjaro , D 50,000 units weekly ***  12/06/2022 MRI BRAIN W WO:  Multiple, predominantly periventricular, T2/FLAIR hyperintense lesions involving the bilateral cerebral white matter with associated white matter volume loss. No corresponding enhancement or restricted diffusion. Enlargement of the lateral ventricles, particularly posteriorly, favored secondary to white matter volume loss. Diffuse thinning of the corpus callosum.  12/06/2022 MRI C-SPINE W WO:  1.  Multiple short segment cervical cord lesions consistent with the reported history of multiple sclerosis. Although prior imaging is not available for direct comparison, many of the lesions identified on the current study were described on the prior study. However, lesions identified on this study at the levels of C1 and C7 were not described on the prior study. 2.  No corresponding enhancement to suggest active demyelination.  3.  Multilevel degenerative changes of the cervical spine contributing to mild canal stenosis and moderate to severe right foraminal stenosis at C5-C6. No substantial canal or foraminal stenosis at the remaining cervical levels.   11/03/2022 LABS:  CBC with WBC 5, HGB 13.4, HCT 40, PLT 211, ALC 1; vit D 58.30 04/08/2023 LABS:  CBC with WBC 5.9, HGB 14, HCT 40.9, PLT 222 (diff not  performed); vit D 36.2  Vision:  okay.  Keeps early check up as he has DM 2. Motor:  When standing or sitting for more than 20 minutes, he needs to wait a moment to regain strength in legs.  Infrequently, when driving, he feels his right foot "doesn't listen to his brain fast enough"  Sensory:  right sided numbness and tingling (mostly involving fingers and foot) increased over time.   Pain:  Feels some pain in the right shoulder.  Query pinched nerve. Gait:  Generally feels stable. Bowel/Bladder:  bladder incontinence.  On Myrbetriq  Fatigue:  yes Cognition:  Decreased processing.  Sometimes forgets what he wants to say.   Mood:  He has had some anxiety over the last year due to problems performing his duties at work, however that has improved since he left his job.   Always feels like he has "water in my ears"  HISTORY: In 1999 he woke up one morning with right arm numbness.  He underwent LP where CSF revealed elevated oligoclonal bands and was diagnosed with multiple sclerosis.  He was started on Avonex and later Copaxone.  Right arm numbness improved but didn't completely resolve.  He saw another neurologist around 2006 who questioned the diagnosis and had him stop taking Copaxone.  In 2019, he developed tingling in his fingertips and right sided arm and leg weakness and numbness.  Repeat LP revealed elevated oligoclonal bands in both CSF and serum.  He was started on Tysabri  in 2020.  He became JCV antibody positive and dosing was extended to every 6 weeks.  Due to concerns about PML, he stopped Tysabri  in November 2022.  He established care with another neurologist in 2023 and was started on Vumerity .    Has had pseudo-exacerbations with numbness and gait instability with heat exposure (such as in summer)   Past DMT:  Avonex, Copaxone, Tysabri  (2020-2022, discontinued due to positive JCV antibody)    Imaging: 09/18/2017 MRI BRAIN W WO:  1. Moderate periventricular white matter disease in  this patient with history of multiple sclerosis. Small nodular focus of enhancement about the occipital horn of the left lateral ventricle is presumably a site of active demyelination. 2. 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 10/11/2017 MRI C-SPINE W WO:  1.     There are several T2 hyperintense foci within the spinal cord adjacent to C2, C3-C4, C4, C6 and C7-T1 as detailed above.  None of these appear to be acute and they do not enhance.   They are consistent with chronic demyelinating plaque associated with multiple sclerosis. 2.     There is borderline spinal stenosis at C5-C6 and mild degenerative changes at C4-C5 and C6-C7.  There is no nerve root compression at any of these levels. 3.     There is a normal enhancement pattern. 03/21/2019 MRI BRAIN W WO:  1.   T2/FLAIR hyperintense foci in the hemispheres, predominantly in the periventricular white matter, radially oriented to the ventricles, and a pattern consistent with chronic demyelinating plaque associated with multiple sclerosis.  One focus that was enhancing on the 2019 MRI no longer does so.  There are no new lesions in the interim. 2.   Generalized cortical atrophy.  This is likely due to his MS.  However, ventricles are enlarged more than typical for the extent of atrophy and superimposed communicating hydrocephalus cannot be ruled out. 03/21/2019 MRI C-SPINE W WO: 1.    Several T2 hyperintense foci within the spinal cord in a pattern consistent with chronic demyelinating plaque associated with multiple sclerosis.  None of the foci enhances or appears to be acute. 2.   At C5-C6 there is a right paramedian disc protrusion and uncovertebral spurring causing moderate right foraminal narrowing that could impinge upon the right C6 nerve root.  Milder degenerative changes are noted at C4-C5 and C6-C7 without potential for nerve root  compression. 3.    There is a normal enhancement pattern. 06/10/2021 MRI BRAIN W WO:  Mild progression of white matter hyperintensities within the brain compatible with the known demyelinating disease. A few of the new small periventricular lesions enhance compatible with active demyelination.  06/10/2021 MRI C-SPINE W WO:  1.  Several T2 hyperintense lesions within the cervical cord consistent the patient's history of demyelinating disease. Overall appearance grossly similar to comparison examination. No abnormal cervical cord enhancement. 2.  Degenerative disc disease at C5-C6 which has worsened in the interval from the 2010 comparison (no more recent comparison imaging available at the time of image interpretation). There is mild central canal stenosis and severe right foraminal stenosis at this level.  06/10/2021 MRI T-SPINE W WO:  Mild thoracic spondylosis. No demyelinating plaques identified in the thoracic spinal cord.   PAST MEDICAL HISTORY: Past Medical History:  Diagnosis Date   Allergy    Asthma    as a child   Diabetes mellitus without complication (HCC)    Hyperlipidemia    Hypertension    Multiple sclerosis (HCC) 1999   Dr Maxie Spaniel   Nephrolithiasis    sees urology    MEDICATIONS:  Current Outpatient Medications on File Prior to Visit  Medication Sig Dispense Refill   alfuzosin  (UROXATRAL ) 10 MG 24 hr tablet Take 1 tablet (10 mg total) by mouth daily with breakfast. 90 tablet 1   glucose blood (CONTOUR NEXT TEST) test strip one strip (1 each dose) by Other route daily.     insulin  degludec (TRESIBA  FLEXTOUCH) 200 UNIT/ML FlexTouch Pen Inject 34 Units into the skin daily. 12 mL 1   Insulin  Pen Needle (TECHLITE PLUS PEN NEEDLES) 32G X 4 MM MISC Use with Tresiba  FlexTouch U-200 pen. One injection daily as directed. 100 each 3   lisinopril  (ZESTRIL ) 5 MG tablet Take 1 tablet (5 mg total) by mouth daily. 90 tablet 3   Multiple Vitamin (MULTIVITAMIN) tablet Take 1 tablet by mouth  daily.     omeprazole  (PRILOSEC) 20 MG capsule Take 1 capsule (20 mg total) by mouth daily. 90 capsule 3   rosuvastatin  (CRESTOR ) 40 MG tablet Take 1 tablet (40 mg total) by mouth daily. 90 tablet 3   tadalafil (CIALIS) 5 MG tablet Take 5 mg by mouth daily as needed for erectile dysfunction.     tamsulosin  (FLOMAX ) 0.4 MG CAPS capsule Take 1 capsule (0.4 mg total) by mouth daily. 90 capsule 1   tirzepatide  (MOUNJARO ) 12.5 MG/0.5ML Pen Inject 12.5 mg into the skin once a week. 6 mL 1   VUMERITY  231 MG CPDR Take 2 capsules (462 mg) by mouth in the morning and at bedtime. 360 capsule 2   No current facility-administered medications on file prior to visit.    ALLERGIES: Allergies  Allergen Reactions   Hazelnut ([place]) Anaphylaxis, Anxiety, Dermatitis, Itching, Rash and Shortness Of Breath    FAMILY HISTORY: Family History  Problem Relation Age of Onset   Hypertension Mother    Alcohol abuse Father    Cancer Maternal Grandfather    Diabetes Maternal Aunt    Colon cancer Neg Hx    Esophageal cancer Neg Hx    Rectal cancer Neg Hx    Stomach cancer Neg Hx       Objective:  *** General: No acute distress.  Patient appears ***-groomed.   Head:  Normocephalic/atraumatic Eyes:  Fundi examined but not visualized Neck: supple, no paraspinal tenderness, full range of motion Heart:  Regular rate and rhythm Lungs:  Clear to auscultation bilaterally Back: No paraspinal tenderness Neurological Exam: alert and oriented.  Speech fluent and not dysarthric, language intact.  CN II-XII intact. Bulk and tone normal, muscle strength 4+/5 right EHL, otherwise 5/5 throughout.  Sensation to pinprick reduced in right upper and lower extremities, vibratory sensation intact.  Deep tendon reflexes 3+ throughout, toes downgoing.  Finger to nose testing intact.  Mildly wide-based gait with slight right limp, Romberg negative.   Janne Members, DO  CC: Duaine German, MD

## 2023-05-13 ENCOUNTER — Encounter: Payer: Self-pay | Admitting: Neurology

## 2023-05-13 ENCOUNTER — Ambulatory Visit (INDEPENDENT_AMBULATORY_CARE_PROVIDER_SITE_OTHER): Admitting: Neurology

## 2023-05-13 ENCOUNTER — Other Ambulatory Visit

## 2023-05-13 VITALS — BP 94/59 | HR 102 | Ht 71.0 in | Wt 156.0 lb

## 2023-05-13 DIAGNOSIS — G35 Multiple sclerosis: Secondary | ICD-10-CM

## 2023-05-13 LAB — CBC WITH DIFFERENTIAL/PLATELET
Absolute Lymphocytes: 988 {cells}/uL (ref 850–3900)
Absolute Monocytes: 470 {cells}/uL (ref 200–950)
Basophils Absolute: 38 {cells}/uL (ref 0–200)
Basophils Relative: 0.7 %
Eosinophils Absolute: 232 {cells}/uL (ref 15–500)
Eosinophils Relative: 4.3 %
HCT: 40.9 % (ref 38.5–50.0)
Hemoglobin: 13.7 g/dL (ref 13.2–17.1)
MCH: 30.3 pg (ref 27.0–33.0)
MCHC: 33.5 g/dL (ref 32.0–36.0)
MCV: 90.5 fL (ref 80.0–100.0)
MPV: 10.9 fL (ref 7.5–12.5)
Monocytes Relative: 8.7 %
Neutro Abs: 3672 {cells}/uL (ref 1500–7800)
Neutrophils Relative %: 68 %
Platelets: 214 10*3/uL (ref 140–400)
RBC: 4.52 10*6/uL (ref 4.20–5.80)
RDW: 12.9 % (ref 11.0–15.0)
Total Lymphocyte: 18.3 %
WBC: 5.4 10*3/uL (ref 3.8–10.8)

## 2023-05-13 MED ORDER — VUMERITY 231 MG PO CPDR: 462.0000 mg | DELAYED_RELEASE_CAPSULE | Freq: Two times a day (BID) | ORAL | 2 refills | Status: DC

## 2023-05-13 NOTE — Patient Instructions (Addendum)
 Vumerity  Check dose in I U (units) of the vit D and let me know (send me a MyChart message) Check CBC with diff today and again in 6 months along with a vit D level in 6 months Check MRI of brain/cervical/thoracic spine with and without contrast in 6 months

## 2023-05-17 ENCOUNTER — Other Ambulatory Visit (HOSPITAL_BASED_OUTPATIENT_CLINIC_OR_DEPARTMENT_OTHER): Payer: Self-pay

## 2023-05-18 ENCOUNTER — Other Ambulatory Visit (HOSPITAL_BASED_OUTPATIENT_CLINIC_OR_DEPARTMENT_OTHER): Payer: Self-pay

## 2023-05-20 ENCOUNTER — Telehealth: Payer: Self-pay

## 2023-05-20 ENCOUNTER — Other Ambulatory Visit (HOSPITAL_BASED_OUTPATIENT_CLINIC_OR_DEPARTMENT_OTHER): Payer: Self-pay

## 2023-05-20 MED ORDER — VUMERITY 231 MG PO CPDR: 462.0000 mg | DELAYED_RELEASE_CAPSULE | Freq: Two times a day (BID) | ORAL | 2 refills | Status: DC

## 2023-05-21 NOTE — Telephone Encounter (Signed)
 New script sent to the correct [facility]

## 2023-06-03 ENCOUNTER — Encounter: Payer: Self-pay | Admitting: Family Medicine

## 2023-06-03 MED ORDER — LORAZEPAM 1 MG PO TABS
1.0000 mg | ORAL_TABLET | Freq: Two times a day (BID) | ORAL | 0 refills | Status: AC | PRN
Start: 2023-06-03 — End: ?
  Filled 2023-06-03: qty 3, 2d supply, fill #0

## 2023-06-04 ENCOUNTER — Other Ambulatory Visit: Payer: Self-pay

## 2023-06-04 ENCOUNTER — Other Ambulatory Visit (HOSPITAL_BASED_OUTPATIENT_CLINIC_OR_DEPARTMENT_OTHER): Payer: Self-pay

## 2023-06-09 DIAGNOSIS — N401 Enlarged prostate with lower urinary tract symptoms: Secondary | ICD-10-CM | POA: Diagnosis not present

## 2023-06-09 DIAGNOSIS — N138 Other obstructive and reflux uropathy: Secondary | ICD-10-CM | POA: Diagnosis not present

## 2023-06-10 ENCOUNTER — Other Ambulatory Visit (HOSPITAL_BASED_OUTPATIENT_CLINIC_OR_DEPARTMENT_OTHER): Payer: Self-pay

## 2023-06-11 ENCOUNTER — Other Ambulatory Visit (HOSPITAL_BASED_OUTPATIENT_CLINIC_OR_DEPARTMENT_OTHER): Payer: Self-pay

## 2023-06-19 ENCOUNTER — Ambulatory Visit
Admission: RE | Admit: 2023-06-19 | Discharge: 2023-06-19 | Disposition: A | Discharge status: Home / Self Care | Source: Ambulatory Visit | Attending: Neurology | Admitting: Neurology

## 2023-06-19 DIAGNOSIS — G35 Multiple sclerosis: Secondary | ICD-10-CM

## 2023-06-19 DIAGNOSIS — I959 Hypotension, unspecified: Secondary | ICD-10-CM | POA: Diagnosis not present

## 2023-06-19 DIAGNOSIS — E785 Hyperlipidemia, unspecified: Secondary | ICD-10-CM | POA: Diagnosis not present

## 2023-06-19 DIAGNOSIS — G35D Multiple sclerosis, unspecified: Secondary | ICD-10-CM

## 2023-06-19 MED ORDER — GADOPICLENOL 0.5 MMOL/ML IV SOLN
7.0000 mL | Freq: Once | INTRAVENOUS | Status: AC | PRN
Start: 2023-06-19 — End: 2023-06-19
  Administered 2023-06-19: 7 mL via INTRAVENOUS

## 2023-06-21 ENCOUNTER — Other Ambulatory Visit (HOSPITAL_BASED_OUTPATIENT_CLINIC_OR_DEPARTMENT_OTHER): Payer: Self-pay

## 2023-06-22 ENCOUNTER — Ambulatory Visit
Admission: RE | Admit: 2023-06-22 | Discharge: 2023-06-22 | Disposition: A | Discharge status: Home / Self Care | Source: Ambulatory Visit | Attending: Neurology | Admitting: Neurology

## 2023-06-22 DIAGNOSIS — G35 Multiple sclerosis: Secondary | ICD-10-CM | POA: Diagnosis not present

## 2023-06-22 MED ORDER — GADOPICLENOL 0.5 MMOL/ML IV SOLN
7.5000 mL | Freq: Once | INTRAVENOUS | Status: AC | PRN
  Administered 2023-06-22: 7.5 mL via INTRAVENOUS

## 2023-06-25 ENCOUNTER — Other Ambulatory Visit (HOSPITAL_BASED_OUTPATIENT_CLINIC_OR_DEPARTMENT_OTHER): Payer: Self-pay

## 2023-06-30 ENCOUNTER — Other Ambulatory Visit (HOSPITAL_BASED_OUTPATIENT_CLINIC_OR_DEPARTMENT_OTHER): Payer: Self-pay

## 2023-07-01 ENCOUNTER — Other Ambulatory Visit: Payer: Self-pay

## 2023-07-19 ENCOUNTER — Ambulatory Visit: Payer: Self-pay | Admitting: Neurology

## 2023-07-19 NOTE — Progress Notes (Signed)
 Patient advised.

## 2023-07-20 ENCOUNTER — Other Ambulatory Visit (HOSPITAL_BASED_OUTPATIENT_CLINIC_OR_DEPARTMENT_OTHER): Payer: Self-pay

## 2023-07-22 ENCOUNTER — Other Ambulatory Visit (HOSPITAL_BASED_OUTPATIENT_CLINIC_OR_DEPARTMENT_OTHER): Payer: Self-pay

## 2023-07-26 ENCOUNTER — Other Ambulatory Visit (HOSPITAL_BASED_OUTPATIENT_CLINIC_OR_DEPARTMENT_OTHER): Payer: Self-pay

## 2023-07-27 ENCOUNTER — Other Ambulatory Visit: Payer: Self-pay

## 2023-07-27 ENCOUNTER — Other Ambulatory Visit: Payer: Self-pay | Admitting: Family Medicine

## 2023-07-27 DIAGNOSIS — R339 Retention of urine, unspecified: Secondary | ICD-10-CM | POA: Diagnosis not present

## 2023-07-27 DIAGNOSIS — N529 Male erectile dysfunction, unspecified: Secondary | ICD-10-CM | POA: Diagnosis not present

## 2023-07-27 DIAGNOSIS — R3915 Urgency of urination: Secondary | ICD-10-CM | POA: Diagnosis not present

## 2023-07-27 DIAGNOSIS — N401 Enlarged prostate with lower urinary tract symptoms: Secondary | ICD-10-CM | POA: Diagnosis not present

## 2023-07-27 DIAGNOSIS — Z87442 Personal history of urinary calculi: Secondary | ICD-10-CM | POA: Diagnosis not present

## 2023-07-27 DIAGNOSIS — N138 Other obstructive and reflux uropathy: Secondary | ICD-10-CM | POA: Diagnosis not present

## 2023-07-27 DIAGNOSIS — R338 Other retention of urine: Secondary | ICD-10-CM | POA: Diagnosis not present

## 2023-07-28 ENCOUNTER — Encounter (HOSPITAL_BASED_OUTPATIENT_CLINIC_OR_DEPARTMENT_OTHER): Payer: Self-pay

## 2023-07-28 ENCOUNTER — Other Ambulatory Visit (HOSPITAL_BASED_OUTPATIENT_CLINIC_OR_DEPARTMENT_OTHER): Payer: Self-pay

## 2023-07-28 MED ORDER — ALFUZOSIN HCL ER 10 MG PO TB24
10.0000 mg | ORAL_TABLET | Freq: Every day | ORAL | 1 refills | Status: AC
  Filled 2023-07-28 – 2023-12-07 (×2): qty 30, 30d supply, fill #0

## 2023-07-29 DIAGNOSIS — F322 Major depressive disorder, single episode, severe without psychotic features: Secondary | ICD-10-CM | POA: Diagnosis not present

## 2023-08-02 DIAGNOSIS — H47323 Drusen of optic disc, bilateral: Secondary | ICD-10-CM | POA: Diagnosis not present

## 2023-08-02 DIAGNOSIS — E119 Type 2 diabetes mellitus without complications: Secondary | ICD-10-CM | POA: Diagnosis not present

## 2023-08-02 DIAGNOSIS — H43813 Vitreous degeneration, bilateral: Secondary | ICD-10-CM | POA: Diagnosis not present

## 2023-08-02 DIAGNOSIS — H526 Other disorders of refraction: Secondary | ICD-10-CM | POA: Diagnosis not present

## 2023-08-02 DIAGNOSIS — H25813 Combined forms of age-related cataract, bilateral: Secondary | ICD-10-CM | POA: Diagnosis not present

## 2023-08-05 ENCOUNTER — Ambulatory Visit (INDEPENDENT_AMBULATORY_CARE_PROVIDER_SITE_OTHER): Admitting: Family Medicine

## 2023-08-05 VITALS — BP 97/54 | HR 92 | Ht 74.0 in | Wt 148.1 lb

## 2023-08-05 DIAGNOSIS — R809 Proteinuria, unspecified: Secondary | ICD-10-CM | POA: Diagnosis not present

## 2023-08-05 DIAGNOSIS — E1121 Type 2 diabetes mellitus with diabetic nephropathy: Secondary | ICD-10-CM

## 2023-08-05 DIAGNOSIS — Z7985 Long-term (current) use of injectable non-insulin antidiabetic drugs: Secondary | ICD-10-CM

## 2023-08-05 DIAGNOSIS — E782 Mixed hyperlipidemia: Secondary | ICD-10-CM

## 2023-08-05 DIAGNOSIS — Z23 Encounter for immunization: Secondary | ICD-10-CM | POA: Diagnosis not present

## 2023-08-05 DIAGNOSIS — I959 Hypotension, unspecified: Secondary | ICD-10-CM | POA: Diagnosis not present

## 2023-08-05 DIAGNOSIS — E1129 Type 2 diabetes mellitus with other diabetic kidney complication: Secondary | ICD-10-CM | POA: Diagnosis not present

## 2023-08-05 LAB — POCT GLYCOSYLATED HEMOGLOBIN (HGB A1C): Hemoglobin A1C: 5.9 % — AB (ref 4.0–5.6)

## 2023-08-05 MED ORDER — TIRZEPATIDE 12.5 MG/0.5ML ~~LOC~~ SOAJ: 12.5000 mg | SUBCUTANEOUS | 1 refills | Status: DC

## 2023-08-05 NOTE — Assessment & Plan Note (Signed)
 BP on low side but asymptomatic.

## 2023-08-05 NOTE — Assessment & Plan Note (Signed)
 A1c looks phenomenal today at 5.9 he has just been using his insulin  as needed if he eats something a little carb heavy or sweet.  So organ to just discontinue it completely at this point continue low-dose ACE inhibitor and Crestor .

## 2023-08-05 NOTE — Patient Instructions (Signed)
 You received your Prevnar 20 (pneumonia vaccine0 Today.

## 2023-08-05 NOTE — Progress Notes (Signed)
 Established Patient Office Visit  Subjective  Patient ID: [name], male    DOB: July 16, 1970  Age: 53 y.o. MRN: [id number]  Chief Complaint  Patient presents with   Diabetes    HPI  Diabetes - no hypoglycemic events. No wounds or sores that are not healing well. No increased thirst or urination. Checking glucose at home. Taking medications as prescribed without any side effects.  No recent changes with his MS medications.  He does occasionally have some balance issues.    ROS    Objective:     BP (!) 97/54   Pulse 92   Ht 6' 2 (1.88 m)   Wt 148 lb 1.3 oz (67.2 kg)   SpO2 98%   BMI 19.01 kg/m    Physical Exam Vitals and nursing note reviewed.  Constitutional:      Appearance: Normal appearance.  HENT:     Head: Normocephalic and atraumatic.  Eyes:     Conjunctiva/sclera: Conjunctivae normal.  Cardiovascular:     Rate and Rhythm: Normal rate and regular rhythm.  Pulmonary:     Effort: Pulmonary effort is normal.     Breath sounds: Normal breath sounds.  Skin:    General: Skin is warm and dry.  Neurological:     Mental Status: He is alert.  Psychiatric:        Mood and Affect: Mood normal.      Results for orders placed or performed in visit on 08/05/23  POCT HgB A1C  Result Value Ref Range   Hemoglobin A1C 5.9 (A) 4.0 - 5.6 %   HbA1c POC (<> result, manual entry)     HbA1c, POC (prediabetic range)     HbA1c, POC (controlled diabetic range)        The ASCVD Risk score (Arnett DK, et al., 2019) failed to calculate for the following reasons:   The valid total cholesterol range is 130 to 320 mg/dL    Assessment & Plan:   Problem List Items Addressed This Visit       Cardiovascular and Mediastinum   Hypotension   BP on low side but asymptomatic.       Relevant Orders   POCT HgB A1C (Completed)   Lipid Panel With LDL/HDL Ratio   CMP14+EGFR   Urine Microalbumin w/creat. ratio     Endocrine   Microalbuminuria due to type 2  diabetes mellitus (HCC)   For updated labs and repeat urine microalbumin.      Relevant Medications   tirzepatide  (MOUNJARO ) 12.5 MG/0.5ML Pen   Other Relevant Orders   POCT HgB A1C (Completed)   Lipid Panel With LDL/HDL Ratio   CMP14+EGFR   Urine Microalbumin w/creat. ratio   Diabetes mellitus, type 2 (HCC) - Primary   A1c looks phenomenal today at 5.9 he has just been using his insulin  as needed if he eats something a little carb heavy or sweet.  So organ to just discontinue it completely at this point continue low-dose ACE inhibitor and Crestor .      Relevant Medications   tirzepatide  (MOUNJARO ) 12.5 MG/0.5ML Pen   Other Relevant Orders   POCT HgB A1C (Completed)   Lipid Panel With LDL/HDL Ratio   CMP14+EGFR   Urine Microalbumin w/creat. ratio     Other   Hyperlipidemia   Relevant Orders   POCT HgB A1C (Completed)   Lipid Panel With LDL/HDL Ratio   CMP14+EGFR   Urine Microalbumin w/creat. ratio   Other Visit Diagnoses  Encounter for immunization       Relevant Orders   Pneumococcal conjugate vaccine 20-valent (Completed)      [facility] to [facility].     Return in about 4 months (around 12/06/2023) for Diabetes follow-up.    Dorothyann Byars, MD

## 2023-08-05 NOTE — Assessment & Plan Note (Signed)
 For updated labs and repeat urine microalbumin.

## 2023-08-06 ENCOUNTER — Ambulatory Visit: Payer: Self-pay | Admitting: Family Medicine

## 2023-08-06 LAB — CMP14+EGFR
ALT: 21 IU/L (ref 0–44)
AST: 16 IU/L (ref 0–40)
Albumin: 5 g/dL — ABNORMAL HIGH (ref 3.8–4.9)
Alkaline Phosphatase: 54 IU/L (ref 44–121)
BUN/Creatinine Ratio: 17 (ref 9–20)
BUN: 10 mg/dL (ref 6–24)
Bilirubin Total: 0.3 mg/dL (ref 0.0–1.2)
CO2: 22 mmol/L (ref 20–29)
Calcium: 9.8 mg/dL (ref 8.7–10.2)
Chloride: 100 mmol/L (ref 96–106)
Creatinine, Ser: 0.6 mg/dL — ABNORMAL LOW (ref 0.76–1.27)
Globulin, Total: 2 g/dL (ref 1.5–4.5)
Glucose: 89 mg/dL (ref 70–99)
Potassium: 4.8 mmol/L (ref 3.5–5.2)
Sodium: 139 mmol/L (ref 134–144)
Total Protein: 7 g/dL (ref 6.0–8.5)
eGFR: 116 mL/min/1.73 (ref >59)

## 2023-08-06 LAB — LIPID PANEL WITH LDL/HDL RATIO
Cholesterol, Total: 131 mg/dL (ref 100–199)
HDL: 44 mg/dL (ref >39)
LDL Chol Calc (NIH): 74 mg/dL (ref 0–99)
LDL/HDL Ratio: 1.7 ratio (ref 0.0–3.6)
Triglycerides: 61 mg/dL (ref 0–149)
VLDL Cholesterol Cal: 13 mg/dL (ref 5–40)

## 2023-08-06 LAB — MICROALBUMIN / CREATININE URINE RATIO
Creatinine, Urine: 115.3 mg/dL
Microalb/Creat Ratio: 6 mg/g{creat} (ref 0–29)
Microalbumin, Urine: 7.4 ug/mL

## 2023-08-06 NOTE — Progress Notes (Signed)
 Your lab work is within acceptable range and there are no concerning findings.   ?

## 2023-08-26 DIAGNOSIS — F331 Major depressive disorder, recurrent, moderate: Secondary | ICD-10-CM | POA: Diagnosis not present

## 2023-09-01 ENCOUNTER — Other Ambulatory Visit: Payer: Self-pay

## 2023-09-09 ENCOUNTER — Encounter: Payer: Self-pay | Admitting: Family Medicine

## 2023-09-09 DIAGNOSIS — E1121 Type 2 diabetes mellitus with diabetic nephropathy: Secondary | ICD-10-CM

## 2023-09-09 DIAGNOSIS — N401 Enlarged prostate with lower urinary tract symptoms: Secondary | ICD-10-CM

## 2023-09-09 MED ORDER — TAMSULOSIN HCL 0.4 MG PO CAPS: 0.4000 mg | ORAL_CAPSULE | Freq: Every day | ORAL | 0 refills | Status: DC

## 2023-09-09 MED ORDER — ROSUVASTATIN CALCIUM 40 MG PO TABS: 40.0000 mg | ORAL_TABLET | Freq: Every day | ORAL | 0 refills | Status: DC

## 2023-09-09 NOTE — Telephone Encounter (Signed)
 Requesting rx rf of  Rosuvastatin  last written 12/29/2022- refilled per protocol  Tamsulosin  last written 03/03/2023 refilled per protocol  Forwarding to provider for review as not sure if medication should be refill as current strength or increased? Mounjaro  last written 08/05/2023 To new pharmacy [facility] [address]  Last OV 08/05/2023 Upcoming appt 12/06/2023

## 2023-09-10 MED ORDER — TIRZEPATIDE 12.5 MG/0.5ML ~~LOC~~ SOAJ: 12.5000 mg | SUBCUTANEOUS | 1 refills | Status: DC

## 2023-09-21 ENCOUNTER — Encounter: Payer: Self-pay | Admitting: Sports Medicine

## 2023-10-18 NOTE — Progress Notes (Unsigned)
 NEUROLOGY FOLLOW UP OFFICE NOTE  [name] [id number]  Assessment/Plan:   Multiple sclerosis  DMT:  Vumerity  He will check dose of his vit D supplement in units and let me know Check CBC with diff today.  Check CBC with diff, LFTs and vit D in 6 months. Repeat MRI of brain and C-spine with and without contrast in 6 months. Follow up in 6 months (after repeat tests performed)   Subjective:  [name] is a 53 year old right-handed male with HTN, DM 2, HLD and history of kidney stones who follows up for multiple sclerosis.  UPDATE: Current DMT:  Vumerity  (2023) Current medications:  ASA 81mg  daily, alfuzosin , insulin , lisinopril , Myrbegriq, tadalafil, Mounjaro , D daily   05/31&06/22/2023 MRI BRAIN & C &T-SPINE W WO:  1. Similar T2/FLAIR hyperintensities within the white matter, compatible with prior demyelination. No new or enhancing lesions identified. 2. Similar multiple short-segment T2 hyperintensities within the cervical cord, compatible with prior demyelination. No definite new or enhancing lesions identified. 3. No evidence of demyelinating disease in the thoracic spinal cord. 4. At C5-C6, moderate to severe right foraminal stenosis and mild canal stenosis.  05/13/2023 LAB:  CBC with WBC 5.4, HGB 13.7, HCT 40.9, PLT 214, ALC 988 08/05/2023 LAB:  hepatic panel with t bili 0.3, ALP 54, AST 16, ALT 21  Vision:  okay.  Keeps early check up as he has DM 2. Motor:  When standing or sitting for more than 20 minutes, he needs to wait a moment to regain strength in legs.  Infrequently, when driving, he feels his right foot doesn't listen to his brain fast enough  Sensory:  right sided numbness and tingling (mostly involving fingers and foot) increased over time.   Pain:  Feels some pain in the right shoulder.  Query pinched nerve. Gait:  Generally feels stable.  No falls. Bowel/Bladder:  bladder incontinence.  On Myrbetriq  Fatigue:  yes Cognition:  Decreased  processing.  Sometimes forgets what he wants to say.   Mood:  He has had some anxiety over the last year due to problems performing his duties at work, however that has improved since he left his job.   Always feels like he has water in my ears  HISTORY: In 1999 he woke up one morning with right arm numbness.  He underwent LP where CSF revealed elevated oligoclonal bands and was diagnosed with multiple sclerosis.  He was started on Avonex and later Copaxone.  Right arm numbness improved but didn't completely resolve.  He saw another neurologist around 2006 who questioned the diagnosis and had him stop taking Copaxone.  In 2019, he developed tingling in his fingertips and right sided arm and leg weakness and numbness.  Repeat LP revealed elevated oligoclonal bands in both CSF and serum.  He was started on Tysabri  in 2020.  He became JCV antibody positive and dosing was extended to every 6 weeks.  Due to concerns about PML, he stopped Tysabri  in November 2022.  He established care with another neurologist in 2023 and was started on Vumerity .    Has had pseudo-exacerbations with numbness and gait instability with heat exposure (such as in summer)   Past DMT:  Avonex, Copaxone, Tysabri  (2020-2022, discontinued due to positive JCV antibody)    Imaging: 09/18/2017 MRI BRAIN W WO:  1. Moderate periventricular white matter disease in this patient with history of multiple sclerosis. Small nodular focus of enhancement about the occipital horn of the left lateral ventricle is presumably a  site of active demyelination. 2. 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 10/11/2017 MRI C-SPINE W WO:  1.     There are several T2 hyperintense foci within the spinal cord adjacent to C2, C3-C4, C4, C6 and C7-T1 as detailed above.  None of these appear to be acute and they do not enhance.   They are consistent with  chronic demyelinating plaque associated with multiple sclerosis. 2.     There is borderline spinal stenosis at C5-C6 and mild degenerative changes at C4-C5 and C6-C7.  There is no nerve root compression at any of these levels. 3.     There is a normal enhancement pattern. 03/21/2019 MRI BRAIN W WO:  1.   T2/FLAIR hyperintense foci in the hemispheres, predominantly in the periventricular white matter, radially oriented to the ventricles, and a pattern consistent with chronic demyelinating plaque associated with multiple sclerosis.  One focus that was enhancing on the 2019 MRI no longer does so.  There are no new lesions in the interim. 2.   Generalized cortical atrophy.  This is likely due to his MS.  However, ventricles are enlarged more than typical for the extent of atrophy and superimposed communicating hydrocephalus cannot be ruled out. 03/21/2019 MRI C-SPINE W WO: 1.    Several T2 hyperintense foci within the spinal cord in a pattern consistent with chronic demyelinating plaque associated with multiple sclerosis.  None of the foci enhances or appears to be acute. 2.   At C5-C6 there is a right paramedian disc protrusion and uncovertebral spurring causing moderate right foraminal narrowing that could impinge upon the right C6 nerve root.  Milder degenerative changes are noted at C4-C5 and C6-C7 without potential for nerve root compression. 3.    There is a normal enhancement pattern. 06/10/2021 MRI BRAIN W WO:  Mild progression of white matter hyperintensities within the brain compatible with the known demyelinating disease. A few of the new small periventricular lesions enhance compatible with active demyelination.  06/10/2021 MRI C-SPINE W WO:  1.  Several T2 hyperintense lesions within the cervical cord consistent the patient's history of demyelinating disease. Overall appearance grossly similar to comparison examination. No abnormal cervical cord enhancement. 2.  Degenerative disc disease at C5-C6 which  has worsened in the interval from the 2010 comparison (no more recent comparison imaging available at the time of image interpretation). There is mild central canal stenosis and severe right foraminal stenosis at this level.  06/10/2021 MRI T-SPINE W WO:  Mild thoracic spondylosis. No demyelinating plaques identified in the thoracic spinal cord.  12/06/2022 MRI BRAIN W WO:  Multiple, predominantly periventricular, T2/FLAIR hyperintense lesions involving the bilateral cerebral white matter with associated white matter volume loss. No corresponding enhancement or restricted diffusion. Enlargement of the lateral ventricles, particularly posteriorly, favored secondary to white matter volume loss. Diffuse thinning of the corpus callosum.  12/06/2022 MRI C-SPINE W WO:  1.  Multiple short segment cervical cord lesions consistent with the reported history of multiple sclerosis. Although prior imaging is not available for direct comparison, many of the lesions identified on the current study were described on the prior study. However, lesions identified on this study at the levels of C1 and C7 were not described on the prior study. 2.  No corresponding enhancement to suggest active demyelination.  3.  Multilevel degenerative changes of the cervical spine contributing to mild canal stenosis and moderate to severe right foraminal stenosis  at C5-C6. No substantial canal or foraminal stenosis at the remaining cervical levels.   PAST MEDICAL HISTORY: Past Medical History:  Diagnosis Date   Allergy    Asthma    as a child   Diabetes mellitus without complication (HCC)    Hyperlipidemia    Hypertension    Multiple sclerosis 1999   Dr Euna   Nephrolithiasis    sees urology    MEDICATIONS: Current Outpatient Medications on File Prior to Visit  Medication Sig Dispense Refill   alfuzosin  (UROXATRAL ) 10 MG 24 hr tablet Take 1 tablet (10 mg total) by mouth daily with breakfast. 90 tablet 1   glucose blood  (CONTOUR NEXT TEST) test strip one strip (1 each dose) by Other route daily.     Insulin  Pen Needle (TECHLITE PLUS PEN NEEDLES) 32G X 4 MM MISC Use with Tresiba  FlexTouch U-200 pen. One injection daily as directed. 100 each 3   lisinopril  (ZESTRIL ) 5 MG tablet Take 1 tablet (5 mg total) by mouth daily. 90 tablet 3   LORazepam  (ATIVAN ) 1 MG tablet Take 1 tablet (1 mg total) by mouth 30 min before procedure. Can repeat dose in 1 hour if needed. 3 tablet 0   Multiple Vitamin (MULTIVITAMIN) tablet Take 1 tablet by mouth daily.     omeprazole  (PRILOSEC) 20 MG capsule Take 1 capsule (20 mg total) by mouth daily. 90 capsule 3   rosuvastatin  (CRESTOR ) 40 MG tablet Take 1 tablet (40 mg total) by mouth daily. 90 tablet 0   tadalafil (CIALIS) 5 MG tablet Take 5 mg by mouth daily as needed for erectile dysfunction.     tamsulosin  (FLOMAX ) 0.4 MG CAPS capsule Take 1 capsule (0.4 mg total) by mouth daily. 90 capsule 0   tirzepatide  (MOUNJARO ) 12.5 MG/0.5ML Pen Inject 12.5 mg into the skin once a week. 6 mL 1   VUMERITY  231 MG CPDR Take 2 capsules (462 mg) by mouth in the morning and at bedtime. 360 capsule 2   No current facility-administered medications on file prior to visit.    ALLERGIES: Allergies  Allergen Reactions   Hazelnut ([place]) Anaphylaxis, Anxiety, Dermatitis, Itching, Rash and Shortness Of Breath    FAMILY HISTORY: Family History  Problem Relation Age of Onset   Hypertension Mother    Alcohol abuse Father    Cancer Maternal Grandfather    Diabetes Maternal Aunt    Colon cancer Neg Hx    Esophageal cancer Neg Hx    Rectal cancer Neg Hx    Stomach cancer Neg Hx       Objective:  *** General: No acute distress.  Patient appears well-groomed.   Head:  Normocephalic/atraumatic Eyes:  Fundi examined but not visualized Neck: supple, no paraspinal tenderness, full range of motion Heart:  Regular rate and rhythm Lungs:  Clear to auscultation bilaterally Back: No paraspinal  tenderness Neurological Exam: alert and oriented.  Speech fluent and not dysarthric, language intact.  CN II-XII intact. Bulk and tone normal, muscle strength 4+/5 right EHL, otherwise 5/5 throughout.  Sensation to pinprick reduced in right foot, vibratory sensation intact.  Deep tendon reflexes 3+ throughout, toes downgoing.  Finger to nose testing intact.  Steady gait and good stride with slight right limp, Romberg negative.   Juliene Dunnings, DO  CC: Dorothyann Byars, MD

## 2023-10-19 ENCOUNTER — Encounter: Payer: Self-pay | Admitting: Neurology

## 2023-10-19 ENCOUNTER — Ambulatory Visit: Admitting: Neurology

## 2023-10-19 ENCOUNTER — Other Ambulatory Visit

## 2023-10-19 VITALS — BP 96/59 | HR 101 | Ht 71.0 in | Wt 149.0 lb

## 2023-10-19 DIAGNOSIS — G35 Multiple sclerosis: Secondary | ICD-10-CM | POA: Diagnosis not present

## 2023-10-19 DIAGNOSIS — G35D Multiple sclerosis, unspecified: Secondary | ICD-10-CM

## 2023-10-19 LAB — CBC WITH DIFFERENTIAL/PLATELET
Absolute Lymphocytes: 1323 {cells}/uL (ref 850–3900)
Absolute Monocytes: 421 {cells}/uL (ref 200–950)
Basophils Absolute: 39 {cells}/uL (ref 0–200)
Basophils Relative: 0.8 %
Eosinophils Absolute: 221 {cells}/uL (ref 15–500)
Eosinophils Relative: 4.5 %
HCT: 38.1 % — ABNORMAL LOW (ref 38.5–50.0)
Hemoglobin: 12.8 g/dL — ABNORMAL LOW (ref 13.2–17.1)
MCH: 30.8 pg (ref 27.0–33.0)
MCHC: 33.6 g/dL (ref 32.0–36.0)
MCV: 91.6 fL (ref 80.0–100.0)
MPV: 10.8 fL (ref 7.5–12.5)
Monocytes Relative: 8.6 %
Neutro Abs: 2896 {cells}/uL (ref 1500–7800)
Neutrophils Relative %: 59.1 %
Platelets: 219 Thousand/uL (ref 140–400)
RBC: 4.16 Million/uL — ABNORMAL LOW (ref 4.20–5.80)
RDW: 12.5 % (ref 11.0–15.0)
Total Lymphocyte: 27 %
WBC: 4.9 Thousand/uL (ref 3.8–10.8)

## 2023-10-19 NOTE — Patient Instructions (Addendum)
 Vumerity  D3 5000 I U daily Check CBC with diff today and again in 6 months. Your provider has requested that you have labwork completed today. Please go to [facility] ([id number]) on the second floor of this building before leaving the office today. You do not need to check in. If you are not called within 15 minutes please check with the front desk.   Check MRI of brain and cervical spine with and without contrast in 6 months. We have sent a referral to [facility] for your MRI and they will call you directly to schedule your appointment. They are located at [address] [facility]. If you need to contact them directly please call [phone].  Follow up 6 months (after repeat testing)

## 2023-10-20 ENCOUNTER — Ambulatory Visit: Payer: Self-pay | Admitting: Neurology

## 2023-10-22 NOTE — Progress Notes (Signed)
Tried calling patient, no answer.

## 2023-10-26 ENCOUNTER — Encounter: Payer: Self-pay | Admitting: Neurology

## 2023-10-26 MED ORDER — VUMERITY 231 MG PO CPDR: 462.0000 mg | DELAYED_RELEASE_CAPSULE | Freq: Two times a day (BID) | ORAL | 2 refills | Status: AC

## 2023-10-28 ENCOUNTER — Other Ambulatory Visit (HOSPITAL_BASED_OUTPATIENT_CLINIC_OR_DEPARTMENT_OTHER): Payer: Self-pay

## 2023-11-11 ENCOUNTER — Other Ambulatory Visit (HOSPITAL_BASED_OUTPATIENT_CLINIC_OR_DEPARTMENT_OTHER): Payer: Self-pay

## 2023-11-22 ENCOUNTER — Ambulatory Visit: Admitting: Neurology

## 2023-11-24 ENCOUNTER — Other Ambulatory Visit (HOSPITAL_BASED_OUTPATIENT_CLINIC_OR_DEPARTMENT_OTHER): Payer: Self-pay

## 2023-11-24 MED ORDER — INSUPEN PEN NEEDLES 32G X 4 MM MISC
32.0000 g | Freq: Every day | 3 refills | Status: AC
  Filled 2023-11-24: qty 100, 90d supply, fill #0

## 2023-12-06 ENCOUNTER — Ambulatory Visit: Admitting: Family Medicine

## 2023-12-06 ENCOUNTER — Encounter: Payer: Self-pay | Admitting: Family Medicine

## 2023-12-06 ENCOUNTER — Other Ambulatory Visit (HOSPITAL_BASED_OUTPATIENT_CLINIC_OR_DEPARTMENT_OTHER): Payer: Self-pay

## 2023-12-06 VITALS — BP 110/64 | HR 83 | Ht 71.0 in | Wt 152.0 lb

## 2023-12-06 DIAGNOSIS — Z23 Encounter for immunization: Secondary | ICD-10-CM | POA: Diagnosis not present

## 2023-12-06 DIAGNOSIS — Z7985 Long-term (current) use of injectable non-insulin antidiabetic drugs: Secondary | ICD-10-CM

## 2023-12-06 DIAGNOSIS — I952 Hypotension due to drugs: Secondary | ICD-10-CM

## 2023-12-06 DIAGNOSIS — E1121 Type 2 diabetes mellitus with diabetic nephropathy: Secondary | ICD-10-CM

## 2023-12-06 DIAGNOSIS — E1129 Type 2 diabetes mellitus with other diabetic kidney complication: Secondary | ICD-10-CM

## 2023-12-06 DIAGNOSIS — R809 Proteinuria, unspecified: Secondary | ICD-10-CM

## 2023-12-06 DIAGNOSIS — R4189 Other symptoms and signs involving cognitive functions and awareness: Secondary | ICD-10-CM | POA: Insufficient documentation

## 2023-12-06 LAB — POCT GLYCOSYLATED HEMOGLOBIN (HGB A1C): Hemoglobin A1C: 6.2 % — AB (ref 4.0–5.6)

## 2023-12-06 MED ORDER — TIRZEPATIDE 10 MG/0.5ML ~~LOC~~ SOAJ: 10.0000 mg | SUBCUTANEOUS | 1 refills | Status: AC

## 2023-12-06 MED ORDER — ONDANSETRON 4 MG PO TBDP: 4.0000 mg | ORAL_TABLET | Freq: Three times a day (TID) | ORAL | 0 refills | Status: AC | PRN

## 2023-12-06 NOTE — Assessment & Plan Note (Addendum)
 BP at goal and looks good off of the lisinopril  ( was only on it for renal function.

## 2023-12-06 NOTE — Patient Instructions (Signed)
 Call if you can't tolerate the lower Mounjaro 

## 2023-12-06 NOTE — Assessment & Plan Note (Signed)
 5mg  of lisinopril  caused dizziness. Will stop med for now and consider alternatives in future. Could switch to different ACE/ARB or try Farxiga.

## 2023-12-06 NOTE — Progress Notes (Signed)
 Established Patient Office Visit  Patient ID: [name], male    DOB: 1970-04-30  Age: 53 y.o. MRN: [id number] PCP: Alvan Dorothyann BIRCH, MD  Chief Complaint  Patient presents with  . Diabetes    Subjective:     HPI  Discussed the use of AI scribe software for clinical note transcription with the patient, who gave verbal consent to proceed.  History of Present Illness [name] is a 53 year old male who presents with side effects from Mounjaro  injections.  Adverse effects of mounjaro  (tirzepatide ) - Severe headaches and vomiting following last two Mounjaro  injections - Symptoms occur after Thursday injections and persist through Friday, Saturday, and Sunday - Initially tolerated 12.5 mg dose well - No injections taken in the past few weeks due to side effects - No major dietary changes  Orthostatic symptoms and antihypertensive medication - History of low blood pressure - Previously taking lisinopril  5 mg, primarily for kidney protection - Discontinued lisinopril  two weeks ago - Decrease in dizziness and lightheadedness upon standing since stopping lisinopril   Cognitive impairment and fatigue - Difficulty with short-term memory, including recalling scenarios and color patterns during assessments - Fatigue after 45 minutes to one hour of activity - Set up auto-pay for bills to manage finances  Preventive care and immunizations - Received flu shot today - No recent COVID-19 vaccinations - Eye checkup earlier this year     ROS    Objective:     BP 110/64   Pulse 83   Ht 5' 11 (1.803 m)   Wt 152 lb (68.9 kg)   SpO2 99%   BMI 21.20 kg/m    Physical Exam Vitals and nursing note reviewed.  Constitutional:      Appearance: Normal appearance.  HENT:     Head: Normocephalic and atraumatic.  Eyes:     Conjunctiva/sclera: Conjunctivae normal.  Cardiovascular:     Rate and Rhythm: Normal rate and regular rhythm.  Pulmonary:     Effort:  Pulmonary effort is normal.     Breath sounds: Normal breath sounds.  Skin:    General: Skin is warm and dry.  Neurological:     Mental Status: He is alert.  Psychiatric:        Mood and Affect: Mood normal.      Results for orders placed or performed in visit on 12/06/23  POCT HgB A1C  Result Value Ref Range   Hemoglobin A1C 6.2 (A) 4.0 - 5.6 %   HbA1c POC (<> result, manual entry)     HbA1c, POC (prediabetic range)     HbA1c, POC (controlled diabetic range)        The 10-year ASCVD risk score (Arnett DK, et al., 2019) is: 4%    Assessment & Plan:   Problem List Items Addressed This Visit       Cardiovascular and Mediastinum   Hypotension   BP at goal and looks good off of the lisinopril  ( was only on it for renal function.          Endocrine   Microalbuminuria due to type 2 diabetes mellitus (HCC)   5mg  of lisinopril  caused dizziness. Will stop med for now and consider alternatives in future. Could switch to different ACE/ARB or try Farxiga.       Relevant Medications   tirzepatide  (MOUNJARO ) 10 MG/0.5ML Pen   Diabetes mellitus, type 2 (HCC) - Primary   Type 2 diabetes mellitus with diabetic nephropathy Managed with Mounjaro , discontinued due to adverse  effects. A1c controlled at 6.2. Lisinopril  discontinued due to orthostatic symptoms. Monitoring kidney function. - Reduced Mounjaro  dose to 10 mg and instructed to monitor A1c. - Discontinued Tresiba . - Monitor kidney function. - Prescribed Zofran for nausea management if restarting Mounjaro .      Relevant Medications   tirzepatide  (MOUNJARO ) 10 MG/0.5ML Pen   Other Relevant Orders   POCT HgB A1C (Completed)     Other   Concern about memory   Short-term memory impairment, under evaluation Noted during disability evaluation. Concerns about managing logistics and financial tasks. Discussed potential neuropsychological evaluation. - Discuss with neurologist about potential neuropsychological evaluation. -  Consider strategies for managing short-term memory issues, such as using reminders and calendars.      Other Visit Diagnoses       Encounter for immunization       Relevant Orders   Flu vaccine trivalent PF, 6mos and older(Flulaval,Afluria,Fluarix,Fluzone) (Completed)       Assessment and Plan Assessment & Plan  Adverse effects of Mounjaro  (tirzepatide ) therapy Headaches and vomiting after dose increase to 12.5 mg, resolved after discontinuation. Plan to reduce dose to 10 mg, previously well-tolerated. - Reduced Mounjaro  dose to 10 mg. - Prescribed Zofran for nausea management if restarting Mounjaro .  Orthostatic symptoms possibly related to antihypertensive therapy Symptoms improved after discontinuation of lisinopril , which was used for kidney protection. Benefits outweighed by symptoms. - Discontinued lisinopril . - Monitor kidney function.  General Health Maintenance Received flu shot. Discussed COVID-19 vaccination, hesitant due to previous adverse reaction. - Consider COVID-19 vaccination if he decides to proceed.    Return in about 4 months (around 04/04/2024) for Diabetes follow-up.    Dorothyann Byars, MD [facility] at [facility]

## 2023-12-06 NOTE — Assessment & Plan Note (Signed)
 Short-term memory impairment, under evaluation Noted during disability evaluation. Concerns about managing logistics and financial tasks. Discussed potential neuropsychological evaluation. - Discuss with neurologist about potential neuropsychological evaluation. - Consider strategies for managing short-term memory issues, such as using reminders and calendars.

## 2023-12-06 NOTE — Assessment & Plan Note (Signed)
 Type 2 diabetes mellitus with diabetic nephropathy Managed with Mounjaro , discontinued due to adverse effects. A1c controlled at 6.2. Lisinopril  discontinued due to orthostatic symptoms. Monitoring kidney function. - Reduced Mounjaro  dose to 10 mg and instructed to monitor A1c. - Discontinued Tresiba . - Monitor kidney function. - Prescribed Zofran for nausea management if restarting Mounjaro .

## 2023-12-07 ENCOUNTER — Other Ambulatory Visit: Payer: Self-pay

## 2023-12-30 ENCOUNTER — Other Ambulatory Visit: Payer: Self-pay | Admitting: Family Medicine

## 2024-01-06 ENCOUNTER — Other Ambulatory Visit: Payer: Self-pay | Admitting: Family Medicine

## 2024-01-06 DIAGNOSIS — N401 Enlarged prostate with lower urinary tract symptoms: Secondary | ICD-10-CM

## 2024-01-10 ENCOUNTER — Telehealth: Payer: Self-pay

## 2024-01-10 NOTE — Telephone Encounter (Signed)
 Pharmacy Patient Advocate Encounter   Received notification from Onbase that prior authorization for Mounjaro  10MG /0.5ML auto-injectors is due for renewal.   Insurance verification completed.   The patient is insured through [employer] 2017.  Action: PA required and submitted KEY/EOC/Request #: [id number] due to:

## 2024-01-24 ENCOUNTER — Other Ambulatory Visit (HOSPITAL_BASED_OUTPATIENT_CLINIC_OR_DEPARTMENT_OTHER): Payer: Self-pay

## 2024-01-24 ENCOUNTER — Other Ambulatory Visit: Payer: Self-pay | Admitting: Family Medicine

## 2024-01-24 MED ORDER — OMEPRAZOLE 20 MG PO CPDR: 20.0000 mg | DELAYED_RELEASE_CAPSULE | Freq: Every day | ORAL | 3 refills | Status: DC

## 2024-02-16 ENCOUNTER — Encounter: Payer: Self-pay | Admitting: Family Medicine

## 2024-02-16 MED ORDER — OMEPRAZOLE 20 MG PO CPDR: 20.0000 mg | DELAYED_RELEASE_CAPSULE | Freq: Every day | ORAL | 3 refills | Status: AC

## 2024-04-04 ENCOUNTER — Ambulatory Visit: Admitting: Family Medicine

## 2024-05-02 ENCOUNTER — Other Ambulatory Visit

## 2024-05-11 ENCOUNTER — Ambulatory Visit: Admitting: Neurology
